# Patient Record
Sex: Female | Born: 1955
Health system: Western US, Academic
[De-identification: ages and names within clinical notes are randomized; demographics above are authoritative.]

## PROBLEM LIST (undated history)

## (undated) DIAGNOSIS — E559 Vitamin D deficiency, unspecified: Secondary | ICD-10-CM

## (undated) DIAGNOSIS — D219 Benign neoplasm of connective and other soft tissue, unspecified: Secondary | ICD-10-CM

## (undated) DIAGNOSIS — N951 Menopausal and female climacteric states: Secondary | ICD-10-CM

## (undated) DIAGNOSIS — Z8742 Personal history of other diseases of the female genital tract: Secondary | ICD-10-CM

## (undated) DIAGNOSIS — B9689 Other specified bacterial agents as the cause of diseases classified elsewhere: Secondary | ICD-10-CM

## (undated) DIAGNOSIS — R5383 Other fatigue: Secondary | ICD-10-CM

## (undated) DIAGNOSIS — N950 Postmenopausal bleeding: Secondary | ICD-10-CM

## (undated) DIAGNOSIS — Z87898 Personal history of other specified conditions: Secondary | ICD-10-CM

## (undated) DIAGNOSIS — G47429 Narcolepsy in conditions classified elsewhere without cataplexy: Secondary | ICD-10-CM

## (undated) DIAGNOSIS — F329 Major depressive disorder, single episode, unspecified: Secondary | ICD-10-CM

## (undated) DIAGNOSIS — R300 Dysuria: Secondary | ICD-10-CM

## (undated) DIAGNOSIS — H9319 Tinnitus, unspecified ear: Secondary | ICD-10-CM

## (undated) DIAGNOSIS — Z87440 Personal history of urinary (tract) infections: Secondary | ICD-10-CM

## (undated) DIAGNOSIS — D509 Iron deficiency anemia, unspecified: Secondary | ICD-10-CM

## (undated) DIAGNOSIS — R896 Abnormal cytological findings in specimens from other organs, systems and tissues: Secondary | ICD-10-CM

## (undated) DIAGNOSIS — A6000 Herpesviral infection of urogenital system, unspecified: Secondary | ICD-10-CM

## (undated) DIAGNOSIS — K625 Hemorrhage of anus and rectum: Secondary | ICD-10-CM

## (undated) DIAGNOSIS — N926 Irregular menstruation, unspecified: Secondary | ICD-10-CM

## (undated) DIAGNOSIS — Z6837 Body mass index (BMI) 37.0-37.9, adult: Secondary | ICD-10-CM

## (undated) DIAGNOSIS — F32A Depression, unspecified: Secondary | ICD-10-CM

## (undated) DIAGNOSIS — H409 Unspecified glaucoma: Secondary | ICD-10-CM

## (undated) DIAGNOSIS — Z8639 Personal history of other endocrine, nutritional and metabolic disease: Secondary | ICD-10-CM

## (undated) DIAGNOSIS — N840 Polyp of corpus uteri: Secondary | ICD-10-CM

## (undated) DIAGNOSIS — N760 Acute vaginitis: Secondary | ICD-10-CM

## (undated) DIAGNOSIS — J309 Allergic rhinitis, unspecified: Secondary | ICD-10-CM

## (undated) HISTORY — DX: Herpesviral infection of urogenital system, unspecified: A60.00

## (undated) HISTORY — DX: Major depressive disorder, single episode, unspecified: F32.9

## (undated) HISTORY — DX: Polyp of corpus uteri: N84.0

## (undated) HISTORY — DX: Tinnitus, unspecified ear: H93.19

## (undated) HISTORY — DX: Other fatigue: R53.83

## (undated) HISTORY — DX: Body mass index (bmi) 37.0-37.9, adult: Z68.37

## (undated) HISTORY — DX: Benign neoplasm of connective and other soft tissue, unspecified: D21.9

## (undated) HISTORY — DX: Vitamin D deficiency, unspecified: E55.9

## (undated) HISTORY — DX: Hemorrhage of anus and rectum: K62.5

## (undated) HISTORY — DX: Acute vaginitis: N76.0

## (undated) HISTORY — DX: Iron deficiency anemia, unspecified: D50.9

## (undated) HISTORY — DX: Allergic rhinitis, unspecified: J30.9

## (undated) HISTORY — DX: Postmenopausal bleeding: N95.0

## (undated) HISTORY — DX: Personal history of other diseases of the female genital tract: Z87.42

## (undated) HISTORY — DX: Irregular menstruation, unspecified: N92.6

## (undated) HISTORY — DX: Personal history of other endocrine, nutritional and metabolic disease: Z86.39

## (undated) HISTORY — DX: Abnormal cytological findings in specimens from other organs, systems and tissues: R89.6

## (undated) HISTORY — DX: Depression, unspecified: F32.A

## (undated) HISTORY — DX: Menopausal and female climacteric states: N95.1

## (undated) HISTORY — DX: Other specified bacterial agents as the cause of diseases classified elsewhere: B96.89

## (undated) HISTORY — DX: Personal history of urinary (tract) infections: Z87.440

## (undated) HISTORY — DX: Dysuria: R30.0

## (undated) HISTORY — DX: Personal history of other specified conditions: Z87.898

## (undated) HISTORY — DX: Unspecified glaucoma: H40.9

## (undated) HISTORY — DX: Narcolepsy in conditions classified elsewhere without cataplexy: G47.429

---

## 1998-11-09 ENCOUNTER — Encounter: Payer: Self-pay | Admitting: Obstetrics and Gynecology

## 1998-11-09 ENCOUNTER — Ambulatory Visit (HOSPITAL_COMMUNITY): Admission: RE | Admit: 1998-11-09 | Discharge: 1998-11-09 | Payer: Self-pay | Admitting: Obstetrics and Gynecology

## 1998-11-23 ENCOUNTER — Encounter: Payer: Self-pay | Admitting: Family Medicine

## 1998-11-23 ENCOUNTER — Encounter: Admission: RE | Admit: 1998-11-23 | Discharge: 1998-11-23 | Payer: Self-pay | Admitting: Family Medicine

## 1999-10-17 DIAGNOSIS — IMO0001 Reserved for inherently not codable concepts without codable children: Secondary | ICD-10-CM

## 1999-10-17 HISTORY — DX: Reserved for inherently not codable concepts without codable children: IMO0001

## 1999-10-26 ENCOUNTER — Other Ambulatory Visit: Admission: RE | Admit: 1999-10-26 | Discharge: 1999-10-26 | Payer: Self-pay | Admitting: Obstetrics and Gynecology

## 2000-01-17 DIAGNOSIS — Z8742 Personal history of other diseases of the female genital tract: Secondary | ICD-10-CM

## 2000-01-17 HISTORY — DX: Personal history of other diseases of the female genital tract: Z87.42

## 2000-02-10 ENCOUNTER — Other Ambulatory Visit: Admission: RE | Admit: 2000-02-10 | Discharge: 2000-02-10 | Payer: Self-pay | Admitting: Obstetrics and Gynecology

## 2000-02-13 ENCOUNTER — Other Ambulatory Visit: Admission: RE | Admit: 2000-02-13 | Discharge: 2000-02-13 | Payer: Self-pay | Admitting: Obstetrics and Gynecology

## 2000-02-13 ENCOUNTER — Encounter (INDEPENDENT_AMBULATORY_CARE_PROVIDER_SITE_OTHER): Payer: Self-pay

## 2000-03-02 ENCOUNTER — Ambulatory Visit (HOSPITAL_COMMUNITY): Admission: RE | Admit: 2000-03-02 | Discharge: 2000-03-02 | Payer: Self-pay | Admitting: Obstetrics and Gynecology

## 2000-03-02 ENCOUNTER — Encounter: Payer: Self-pay | Admitting: Obstetrics and Gynecology

## 2000-07-27 ENCOUNTER — Ambulatory Visit (HOSPITAL_COMMUNITY): Admission: RE | Admit: 2000-07-27 | Discharge: 2000-07-27 | Payer: Self-pay | Admitting: Internal Medicine

## 2001-09-16 DIAGNOSIS — A6000 Herpesviral infection of urogenital system, unspecified: Secondary | ICD-10-CM

## 2001-09-16 DIAGNOSIS — N951 Menopausal and female climacteric states: Secondary | ICD-10-CM

## 2001-09-16 HISTORY — DX: Menopausal and female climacteric states: N95.1

## 2001-09-16 HISTORY — DX: Herpesviral infection of urogenital system, unspecified: A60.00

## 2001-10-09 ENCOUNTER — Other Ambulatory Visit: Admission: RE | Admit: 2001-10-09 | Discharge: 2001-10-09 | Payer: Self-pay | Admitting: Obstetrics and Gynecology

## 2001-10-09 DIAGNOSIS — Z8742 Personal history of other diseases of the female genital tract: Secondary | ICD-10-CM

## 2001-10-09 DIAGNOSIS — D509 Iron deficiency anemia, unspecified: Secondary | ICD-10-CM

## 2001-10-09 HISTORY — DX: Iron deficiency anemia, unspecified: D50.9

## 2001-10-09 HISTORY — DX: Personal history of other diseases of the female genital tract: Z87.42

## 2001-10-17 ENCOUNTER — Ambulatory Visit (HOSPITAL_COMMUNITY): Admission: RE | Admit: 2001-10-17 | Discharge: 2001-10-17 | Payer: Self-pay | Admitting: Obstetrics and Gynecology

## 2001-10-17 ENCOUNTER — Encounter: Payer: Self-pay | Admitting: Obstetrics and Gynecology

## 2002-01-16 DIAGNOSIS — D219 Benign neoplasm of connective and other soft tissue, unspecified: Secondary | ICD-10-CM

## 2002-01-16 DIAGNOSIS — R300 Dysuria: Secondary | ICD-10-CM

## 2002-01-16 HISTORY — DX: Dysuria: R30.0

## 2002-01-16 HISTORY — DX: Benign neoplasm of connective and other soft tissue, unspecified: D21.9

## 2002-06-17 DIAGNOSIS — Z8742 Personal history of other diseases of the female genital tract: Secondary | ICD-10-CM

## 2002-06-17 HISTORY — DX: Personal history of other diseases of the female genital tract: Z87.42

## 2002-11-18 ENCOUNTER — Other Ambulatory Visit: Admission: RE | Admit: 2002-11-18 | Discharge: 2002-11-18 | Payer: Self-pay | Admitting: Obstetrics and Gynecology

## 2003-01-02 ENCOUNTER — Ambulatory Visit (HOSPITAL_COMMUNITY): Admission: RE | Admit: 2003-01-02 | Discharge: 2003-01-02 | Payer: Self-pay | Admitting: Obstetrics and Gynecology

## 2004-01-17 HISTORY — PX: EYE SURGERY: SHX253

## 2004-08-14 ENCOUNTER — Emergency Department (HOSPITAL_COMMUNITY): Admission: EM | Admit: 2004-08-14 | Discharge: 2004-08-15 | Payer: Self-pay | Admitting: Emergency Medicine

## 2004-08-15 ENCOUNTER — Ambulatory Visit (HOSPITAL_COMMUNITY): Admission: RE | Admit: 2004-08-15 | Discharge: 2004-08-16 | Payer: Self-pay | Admitting: Ophthalmology

## 2004-09-21 ENCOUNTER — Ambulatory Visit (HOSPITAL_COMMUNITY): Admission: RE | Admit: 2004-09-21 | Discharge: 2004-09-21 | Payer: Self-pay | Admitting: Ophthalmology

## 2005-01-23 ENCOUNTER — Other Ambulatory Visit: Admission: RE | Admit: 2005-01-23 | Discharge: 2005-01-23 | Payer: Self-pay | Admitting: Obstetrics and Gynecology

## 2005-04-10 ENCOUNTER — Ambulatory Visit (HOSPITAL_COMMUNITY): Admission: RE | Admit: 2005-04-10 | Discharge: 2005-04-10 | Payer: Self-pay | Admitting: Obstetrics and Gynecology

## 2007-01-15 ENCOUNTER — Ambulatory Visit (HOSPITAL_COMMUNITY): Admission: RE | Admit: 2007-01-15 | Discharge: 2007-01-15 | Payer: Self-pay | Admitting: Obstetrics and Gynecology

## 2007-03-17 DIAGNOSIS — Z87898 Personal history of other specified conditions: Secondary | ICD-10-CM

## 2007-03-17 HISTORY — DX: Personal history of other specified conditions: Z87.898

## 2007-04-17 DIAGNOSIS — K625 Hemorrhage of anus and rectum: Secondary | ICD-10-CM

## 2007-04-17 HISTORY — DX: Hemorrhage of anus and rectum: K62.5

## 2007-04-26 ENCOUNTER — Ambulatory Visit (HOSPITAL_BASED_OUTPATIENT_CLINIC_OR_DEPARTMENT_OTHER): Admission: RE | Admit: 2007-04-26 | Discharge: 2007-04-26 | Payer: Self-pay | Admitting: Internal Medicine

## 2007-05-05 ENCOUNTER — Ambulatory Visit: Payer: Self-pay | Admitting: Internal Medicine

## 2007-05-17 DIAGNOSIS — N950 Postmenopausal bleeding: Secondary | ICD-10-CM

## 2007-05-17 HISTORY — DX: Postmenopausal bleeding: N95.0

## 2007-06-17 DIAGNOSIS — B9689 Other specified bacterial agents as the cause of diseases classified elsewhere: Secondary | ICD-10-CM

## 2007-06-17 HISTORY — DX: Other specified bacterial agents as the cause of diseases classified elsewhere: B96.89

## 2007-08-19 ENCOUNTER — Ambulatory Visit (HOSPITAL_BASED_OUTPATIENT_CLINIC_OR_DEPARTMENT_OTHER): Admission: RE | Admit: 2007-08-19 | Discharge: 2007-08-19 | Payer: Self-pay | Admitting: Internal Medicine

## 2007-08-24 ENCOUNTER — Ambulatory Visit: Payer: Self-pay | Admitting: Internal Medicine

## 2007-11-17 DIAGNOSIS — Z87440 Personal history of urinary (tract) infections: Secondary | ICD-10-CM

## 2007-11-17 HISTORY — DX: Personal history of urinary (tract) infections: Z87.440

## 2008-06-29 ENCOUNTER — Encounter: Payer: Self-pay | Admitting: Family Medicine

## 2008-09-22 ENCOUNTER — Ambulatory Visit (HOSPITAL_COMMUNITY): Admission: RE | Admit: 2008-09-22 | Discharge: 2008-09-22 | Payer: Self-pay | Admitting: Obstetrics and Gynecology

## 2008-12-22 ENCOUNTER — Ambulatory Visit: Payer: Self-pay | Admitting: Family Medicine

## 2008-12-22 DIAGNOSIS — H9319 Tinnitus, unspecified ear: Secondary | ICD-10-CM

## 2008-12-22 DIAGNOSIS — G47429 Narcolepsy in conditions classified elsewhere without cataplexy: Secondary | ICD-10-CM

## 2008-12-22 DIAGNOSIS — D509 Iron deficiency anemia, unspecified: Secondary | ICD-10-CM | POA: Insufficient documentation

## 2008-12-22 DIAGNOSIS — J209 Acute bronchitis, unspecified: Secondary | ICD-10-CM

## 2008-12-22 HISTORY — DX: Tinnitus, unspecified ear: H93.19

## 2008-12-22 HISTORY — DX: Narcolepsy in conditions classified elsewhere without cataplexy: G47.429

## 2009-01-01 ENCOUNTER — Ambulatory Visit: Payer: Self-pay | Admitting: Family Medicine

## 2009-01-01 DIAGNOSIS — M7040 Prepatellar bursitis, unspecified knee: Secondary | ICD-10-CM | POA: Insufficient documentation

## 2009-01-05 ENCOUNTER — Telehealth: Payer: Self-pay | Admitting: Family Medicine

## 2009-04-16 ENCOUNTER — Ambulatory Visit: Payer: Self-pay | Admitting: Family Medicine

## 2009-04-16 LAB — CONVERTED CEMR LAB
Bilirubin Urine: NEGATIVE
Blood in Urine, dipstick: NEGATIVE
Nitrite: NEGATIVE
Specific Gravity, Urine: 1.02
WBC Urine, dipstick: NEGATIVE
pH: 7

## 2009-04-23 ENCOUNTER — Ambulatory Visit: Payer: Self-pay | Admitting: Family Medicine

## 2009-04-23 DIAGNOSIS — J309 Allergic rhinitis, unspecified: Secondary | ICD-10-CM | POA: Insufficient documentation

## 2009-04-23 HISTORY — DX: Allergic rhinitis, unspecified: J30.9

## 2009-05-04 LAB — CONVERTED CEMR LAB
Bilirubin, Direct: 0.1 mg/dL (ref 0.0–0.3)
Calcium: 8.9 mg/dL (ref 8.4–10.5)
Cholesterol: 145 mg/dL (ref 0–200)
Creatinine, Ser: 0.7 mg/dL (ref 0.4–1.2)
Eosinophils Absolute: 0.1 10*3/uL (ref 0.0–0.7)
Glucose, Bld: 112 mg/dL — ABNORMAL HIGH (ref 70–99)
HCT: 39.3 % (ref 36.0–46.0)
LDL Cholesterol: 80 mg/dL (ref 0–99)
Lymphocytes Relative: 47.7 % — ABNORMAL HIGH (ref 12.0–46.0)
Lymphs Abs: 2.4 10*3/uL (ref 0.7–4.0)
MCV: 87.9 fL (ref 78.0–100.0)
Platelets: 214 10*3/uL (ref 150.0–400.0)
Potassium: 3.6 meq/L (ref 3.5–5.1)
RDW: 13.9 % (ref 11.5–14.6)
TSH: 2.68 microintl units/mL (ref 0.35–5.50)
Triglycerides: 51 mg/dL (ref 0.0–149.0)
WBC: 5 10*3/uL (ref 4.5–10.5)

## 2009-07-26 ENCOUNTER — Telehealth: Payer: Self-pay | Admitting: Family Medicine

## 2009-08-06 ENCOUNTER — Ambulatory Visit: Payer: Self-pay | Admitting: Family Medicine

## 2009-08-06 ENCOUNTER — Telehealth: Payer: Self-pay | Admitting: *Deleted

## 2009-12-16 ENCOUNTER — Ambulatory Visit (HOSPITAL_COMMUNITY)
Admission: RE | Admit: 2009-12-16 | Discharge: 2009-12-16 | Payer: Self-pay | Source: Home / Self Care | Admitting: Family Medicine

## 2009-12-20 DIAGNOSIS — Z8639 Personal history of other endocrine, nutritional and metabolic disease: Secondary | ICD-10-CM

## 2009-12-20 HISTORY — DX: Personal history of other endocrine, nutritional and metabolic disease: Z86.39

## 2009-12-23 DIAGNOSIS — N840 Polyp of corpus uteri: Secondary | ICD-10-CM

## 2009-12-23 HISTORY — DX: Polyp of corpus uteri: N84.0

## 2009-12-27 ENCOUNTER — Ambulatory Visit: Payer: Self-pay | Admitting: Family Medicine

## 2009-12-27 ENCOUNTER — Encounter: Payer: Self-pay | Admitting: Family Medicine

## 2009-12-27 DIAGNOSIS — E559 Vitamin D deficiency, unspecified: Secondary | ICD-10-CM

## 2009-12-27 HISTORY — DX: Vitamin D deficiency, unspecified: E55.9

## 2009-12-30 LAB — CONVERTED CEMR LAB: Vit D, 25-Hydroxy: 30 ng/mL (ref 30–89)

## 2010-02-05 ENCOUNTER — Encounter: Payer: Self-pay | Admitting: Obstetrics and Gynecology

## 2010-02-06 ENCOUNTER — Encounter: Payer: Self-pay | Admitting: Obstetrics and Gynecology

## 2010-02-15 ENCOUNTER — Ambulatory Visit (HOSPITAL_COMMUNITY)
Admission: RE | Admit: 2010-02-15 | Discharge: 2010-02-15 | Payer: Self-pay | Source: Home / Self Care | Attending: Obstetrics and Gynecology | Admitting: Obstetrics and Gynecology

## 2010-02-15 DIAGNOSIS — Z6837 Body mass index (BMI) 37.0-37.9, adult: Secondary | ICD-10-CM

## 2010-02-15 DIAGNOSIS — N926 Irregular menstruation, unspecified: Secondary | ICD-10-CM

## 2010-02-15 HISTORY — PX: DILATION AND CURETTAGE OF UTERUS: SHX78

## 2010-02-15 HISTORY — DX: Body mass index (BMI) 37.0-37.9, adult: Z68.37

## 2010-02-15 HISTORY — DX: Irregular menstruation, unspecified: N92.6

## 2010-02-15 HISTORY — PX: HYSTEROSCOPY: SHX211

## 2010-02-15 LAB — CBC
HCT: 38.5 % (ref 36.0–46.0)
MCHC: 33.5 g/dL (ref 30.0–36.0)
MCV: 85 fL (ref 78.0–100.0)
RBC: 4.53 MIL/uL (ref 3.87–5.11)
RDW: 13.9 % (ref 11.5–15.5)
WBC: 5.4 10*3/uL (ref 4.0–10.5)

## 2010-02-15 NOTE — H&P (Signed)
  [name], [name]             ACCOUNT NO.:  [id number]  MEDICAL RECORD NO.:  [id number]         PATIENT TYPE:  WAMB  LOCATION:                                FACILITY:  [facility]  PHYSICIAN:  Janine Limbo, M.D.DATE OF BIRTH:  1955-09-13  DATE OF ADMISSION:  02/15/2010 DATE OF DISCHARGE:                             HISTORY & PHYSICAL   HISTORY OF PRESENT ILLNESS:  [name] is a 55 year old female, para 4-0-0-4, who presents for hysteroscopy with resection of polyps and a submucosal fibroid.  The patient has been followed at the [facility].  The patient complains of irregular menstrual cycles.  She has a history of anemia. She has a history of fibroids.  An ultrasound showed a 12.55 x 10.6 cm fibroid uterus with multiple fibroids.  Some of fibroids were thought to be submucosal.  The left ovary appeared normal.  The right ovary could not be seen.  A polyp was noted. An endometrial biopsy was obtained that  showed benign elements.  Her most recent Pap smear was within normal limits.  OBSTETRICAL HISTORY:  The patient has had 4 term vaginal deliveries.  DRUG ALLERGIES: 1. The patient reports that she is allergic to CODEINE. 2. She reports that she is also allergic to LATEX.  PAST MEDICAL HISTORY:  The patient denies; 1. Hypertension. 2. Diabetes.  MEDICATIONS:  She currently takes; 1. Vitamin D. 2. Calcium. 3. Fish oil. 4. She takes Valtrex 500 mg each day.  SOCIAL HISTORY:  The patient used to be a cigarette smoker, but is no longer smoking.  She denies alcohol use and other recreational drug uses.  REVIEW OF SYSTEMS:  Please see history of present illness.  FAMILY HISTORY:  Noncontributory.  PHYSICAL EXAMINATION:  VITAL SIGNS:  Weight is 233 pounds.  Height is 5 feet 7 inches (BMI equals 37). HEENT:  Within normal limits. Chest:  Clear. HEART:  Regular rate and rhythm. BREASTS:  Without  masses. ABDOMEN:  Nontender. EXTREMITIES:  Grossly normal. NEUROLOGIC:  Grossly normal. PELVIC:  External genitalia is normal.  The vagina is normal.  Cervix is nontender.  The uterus is 16-18 weeks size and irregular.  ADNEXA:  No masses are appreciated and rectovaginal exam confirms.  ASSESSMENT: 1. Irregular menstrual cycles. 2. Fibroid uterus. 3. History of anemia. 4. BMI equals 37.  PLAN:  The patient will undergo hysteroscopy with resection of submucosal fibroids and endometrial polyps.  At present, the patient understands the indications for her surgical procedure and she accepts the risks of, but not limited to, anesthetic complications, bleeding, infections, and possible damage to the surrounding organs.     Janine Limbo, M.D.     AVS/MEDQ  D:  02/14/2010  T:  02/14/2010  Job:  [id number]  cc:   Evelena Peat, M.D.  Merlene Laughter. Renae Gloss, M.D. Fax: [phone]  Electronically Signed by Kirkland Hun M.D. on 02/15/2010 12:12:45 PM

## 2010-02-17 NOTE — Progress Notes (Signed)
Summary: lab order  Phone Note Call from Patient Call back at Home Phone [phone]   Caller: Patient Call For: Evelena Peat MD Summary of Call: pt would like to have lab order for vit d ,glucose and blood type can I sch? Initial call taken by: Heron Sabins,  July 26, 2009 11:53 AM  Follow-up for Phone Call        OK to set up but I'm not sure blood typing will be covered by insurance without valid dx. Follow-up by: Evelena Peat MD,  August 01, 2009 3:25 PM  Additional Follow-up for Phone Call Additional follow up Details #1::        lab ov 08-06-2009 8.30a Additional Follow-up by: Heron Sabins,  August 03, 2009 8:18 AM

## 2010-02-17 NOTE — Progress Notes (Signed)
Summary: LMTCB, Jozy Mcphearson please call  Phone Note Call from Patient Call back at Home Phone [phone]   Caller: Patient Call For: Evelena Peat MD Summary of Call: pt would like Tassie Pollett to return her call. Initial call taken by: Heron Sabins,  August 06, 2009 4:36 PM  Follow-up for Phone Call        [facility] Sid Falcon LPN  August 06, 2009 5:14 PM   Additional Follow-up for Phone Call Additional follow up Details #1::        spoke with pt - question about vit d level - will need rx.and rov in 3 mos.  - [facility] KIK Additional Follow-up by: Duard Brady LPN,  August 09, 2009 5:04 PM

## 2010-02-17 NOTE — Assessment & Plan Note (Signed)
Summary: CPX // RS   Vital Signs:  Patient profile:   55 year old female Menstrual status:  irregular Height:      67 inches Weight:      235 pounds Temp:     97.8 degrees F oral BP sitting:   120 / 80  (left arm) Cuff size:   large  Vitals Entered By: Sid Falcon LPN (April 23, 1608 11:32 AM) CC: CPX   History of Present Illness: here for CPE.    Sees GYN regularly.  Tetanus up-to-date. Colonoscopy up-to-date and normal. Patient gets regular yearly Pap smears and mammograms.  Acute issue of feeling lightheaded past few weeks. She thinks this may be related to nasal congestion. Occasional postnasal drip symptoms. Recently started back Claritin.  Patient has had some weight gain and difficulty losing weight. Exercising about 3 days per week and plans to increase. Has made some dietary changes.  Family history, past medical history, social history reviewed and no significant changes. Question of history of narcolepsy but no recent concerns. She was treated with modafinil at one point but had side effects       Allergies: 1)  Codeine Sulfate (Codeine Sulfate)  Past History:  Past Medical History: Last updated: 12/22/2008 Anemia-iron deficiency Depression Narcolepsy uterine fibroids  Past Surgical History: Last updated: 12/22/2008 Surgery to eyes X 2 in 2006  Family History: Last updated: 12/22/2008 Family history heart disease, father  CHF Family History Diabetes, mother Family History Hypertension, mother and father Alzheimers, mother Stomach cancer, kidnesy cancer, uncle  Social History: Last updated: 04/23/2009 Occupation: Married Alcohol use-yes Regular exercise-yes Quit smoking 2001 4 children  Risk Factors: Exercise: yes (12/22/2008)  Risk Factors: Smoking Status: quit (12/22/2008) PMH-FH-SH reviewed for relevance  Social History: Occupation: Married Alcohol use-yes Regular exercise-yes Quit smoking 2001 4 children  Review of  Systems       The patient complains of weight gain.  The patient denies anorexia, fever, weight loss, vision loss, decreased hearing, hoarseness, chest pain, syncope, dyspnea on exertion, peripheral edema, prolonged cough, headaches, hemoptysis, abdominal pain, melena, hematochezia, severe indigestion/heartburn, hematuria, incontinence, muscle weakness, suspicious skin lesions, and depression.    Physical Exam  General:  Well-developed,well-nourished,in no acute distress; alert,appropriate and cooperative throughout examination Head:  Normocephalic and atraumatic without obvious abnormalities. No apparent alopecia or balding. Eyes:  pupils equal, pupils round, and pupils reactive to light.   Ears:  External ear exam shows no significant lesions or deformities.  Otoscopic examination reveals clear canals, tympanic membranes are intact bilaterally without bulging, retraction, inflammation or discharge. Hearing is grossly normal bilaterally. Mouth:  Oral mucosa and oropharynx without lesions or exudates.  Teeth in good repair. Neck:  No deformities, masses, or tenderness noted. Breasts:  per GYN Lungs:  Normal respiratory effort, chest expands symmetrically. Lungs are clear to auscultation, no crackles or wheezes. Heart:  Normal rate and regular rhythm. S1 and S2 normal without gallop, murmur, click, rub or other extra sounds. Abdomen:  Bowel sounds positive,abdomen soft and non-tender without masses, organomegaly or hernias noted. 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 Neurologic:  No cranial nerve deficits noted. Station and gait are normal. Plantar reflexes are down-going bilaterally. DTRs are symmetrical throughout. Sensory, motor and coordinative functions appear intact. Skin:  Intact without suspicious lesions or rashes Cervical Nodes:  No lymphadenopathy noted Psych:  normally  interactive, good eye contact, not anxious appearing,  and not depressed appearing.     Impression & Recommendations:  Problem # 1:  Preventive Health Care (ICD-V70.0) labs reviewed and favorable with exception of glucose 112. Discussed weight loss strategies. Continue regular GYN followup. Colonoscopy up to date. Tetanus up-to-date.  Problem # 2:  ALLERGIC RHINITIS (ICD-477.9)  Her updated medication list for this problem includes:    Astelin 137 Mcg/spray Soln (Azelastine hcl) .Marland Kitchen... 1-2 sprays per nostril two times a day.  Complete Medication List: 1)  Calcium 600 1500 Mg Tabs (Calcium carbonate) .... Every other day 2)  Naprosyn 500 Mg Tabs (Naproxen) .... One by mouth two times a day with food 3)  Astelin 137 Mcg/spray Soln (Azelastine hcl) .Marland Kitchen.. 1-2 sprays per nostril two times a day.  Patient Instructions: 1)  Try over the counter Allegra or Zyrtec for allergy symptoms. Prescriptions: ASTELIN 137 MCG/SPRAY SOLN (AZELASTINE HCL) 1-2 sprays per nostril two times a day.  #1 x 6   Entered and Authorized by:   Evelena Peat MD   Signed by:   Evelena Peat MD on 04/23/2009   Method used:   [facility]        [id number]       [address]       [place], [place]  [address]       Ph: [phone] or [phone]       Fax: [phone]   RxID:   [id number]   Preventive Care Screening  Colonoscopy:    Date:  01/17/2007    Results:  normal     Preventive Care Screening  Colonoscopy:    Date:  01/17/2007    Results:  normal

## 2010-02-17 NOTE — Assessment & Plan Note (Signed)
Summary: 3 MTH ROV // RS   Vital Signs:  Patient profile:   55 year old female Menstrual status:  irregular Weight:      226 pounds Temp:     98.3 degrees F oral BP sitting:   130 / 82  (left arm) Cuff size:   large  Vitals Entered By: Sid Falcon LPN (December 27, 2009 11:05 AM)  History of Present Illness: Patient here to discuss several items as follows.  Some chronic intermittent low back pain. Fell on Thanksgiving and may have exacerbated. Requesting name of chiropractors. Describes achy pain 3 out 10 severity lower lumbar region. No radiculopathy symptoms. No numbness or weakness. No urine incontinence. No relief with medication. Exacerbated by movement. Exacerbated by prolonged standing.  Questionable history narcolepsy. States she's had previous sleep study with no obstructive apnea. Was on modafinil but was too costly. Treated with methylphenidate which seemed to work well for her. She does not recall dose she was on.  She states she "self medicates" with caffeine.  She has not felt at risk with things like driving .  Has difficulty with things like reading or staying awake for movies.  History low vitamin D. Currently takes 400 international unit supplement daily. No hx of osteoprosis.  She plans to get PE in April and repeat then.  She has lost some weight due to her efforts.  Allergies: 1)  Codeine Sulfate (Codeine Sulfate)  Past History:  Past Medical History: Last updated: 12/22/2008 Anemia-iron deficiency Depression Narcolepsy uterine fibroids  Past Surgical History: Last updated: 12/22/2008 Surgery to eyes X 2 in 2006  Family History: Last updated: 12/22/2008 Family history heart disease, father  CHF Family History Diabetes, mother Family History Hypertension, mother and father Alzheimers, mother Stomach cancer, kidnesy cancer, uncle  Social History: Last updated: 04/23/2009 Occupation: Married Alcohol use-yes Regular exercise-yes Quit  smoking 2001 4 children  Risk Factors: Exercise: yes (12/22/2008)  Risk Factors: Smoking Status: quit (12/22/2008) PMH-FH-SH reviewed for relevance  Review of Systems  The patient denies anorexia, fever, weight gain, chest pain, syncope, dyspnea on exertion, peripheral edema, prolonged cough, headaches, hemoptysis, abdominal pain, melena, hematochezia, severe indigestion/heartburn, muscle weakness, and depression.    Physical Exam  General:  Well-developed,well-nourished,in no acute distress; alert,appropriate and cooperative throughout examination Ears:  External ear exam shows no significant lesions or deformities.  Otoscopic examination reveals clear canals, tympanic membranes are intact bilaterally without bulging, retraction, inflammation or discharge. Hearing is grossly normal bilaterally. Mouth:  Oral mucosa and oropharynx without lesions or exudates.  Teeth in good repair. Neck:  No deformities, masses, or tenderness noted. Lungs:  Normal respiratory effort, chest expands symmetrically. Lungs are clear to auscultation, no crackles or wheezes. Heart:  Normal rate and regular rhythm. S1 and S2 normal without gallop, murmur, click, rub or other extra sounds. Msk:  No deformity or scoliosis noted of thoracic or lumbar spine.   Extremities:  No clubbing, cyanosis, edema, or deformity noted with normal full range of motion of all joints.   Neurologic:  alert & oriented X3, cranial nerves II-XII intact, strength normal in all extremities, and DTRs symmetrical and normal.   Skin:  no rashes and no suspicious lesions.   Cervical Nodes:  No lymphadenopathy noted   Impression & Recommendations:  Problem # 1:  VITAMIN D DEFICIENCY (ICD-268.9) pt requests repeat level today. Orders: T-Vitamin D (25-Hydroxy) [id number]) Specimen Handling (09811) Venipuncture (91478)  Problem # 2:  LUMBAGO (ICD-724.2) pt given name of local chiropracter. Her updated  medication list for this problem  includes:    Naprosyn 500 Mg Tabs (Naproxen) ..... 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  Complete Medication List: 1)  Calcium 600 1500 Mg Tabs (Calcium carbonate) .... Every other day 2)  Naprosyn 500 Mg Tabs (Naproxen) .... One by mouth two times a day with food as needed 3)  Astelin 137 Mcg/spray Soln (Azelastine hcl) .Marland Kitchen.. 1-2 sprays per nostril two times a day. 4)  Vitamin D (ergocalciferol) 50000 Unit Caps (Ergocalciferol) .Marland Kitchen.. 1 by mouth qweek  Other Orders: Admin 1st Vaccine (16109) Flu Vaccine 83yrs + [id number])  Patient Instructions: 1)  Schedule complete physical for April of 2012 include vitamin D level with standard labs 2)  Dr Genene Churn (chiropractor) 3)  It is important that you exercise reguarly at least 20 minutes 5 times a week. If you develop chest pain, have severe difficulty breathing, or feel very tired, stop exercising immediately and seek medical attention.  4)  You need to lose weight. Consider a lower calorie diet and regular exercise.    Orders Added: 1)  Admin 1st Vaccine [90471] 2)  Flu Vaccine 48yrs + [09811] 3)  T-Vitamin D (25-Hydroxy) [[id number]] 4)  Specimen Handling [99000] 5)  Venipuncture [13086] 6)  Est. Patient Level IV [57846]   Flu Vaccine Consent Questions     Do you have a history of severe allergic reactions to this vaccine? no    Any prior history of allergic reactions to egg and/or gelatin? no    Do you have a sensitivity to the preservative Thimersol? no    Do you have a past history of Guillan-Barre Syndrome? no    Do you currently have an acute febrile illness? no    Have you ever had a severe reaction to latex? no    Vaccine information given and explained to patient? yes    Are you currently pregnant? no    Lot Number:[id number]   Exp Date:07/16/2010   Site Given  Left Deltoid  IM         .lbflu

## 2010-02-22 NOTE — Op Note (Signed)
NAMEFRANCES, AMBROSINO             ACCOUNT NO.:  [id number]  MEDICAL RECORD NO.:  [id number]          PATIENT TYPE:  AMB  LOCATION:  SDC                           FACILITY:  [facility]  PHYSICIAN:  Janine Limbo, M.D.DATE OF BIRTH:  [birth year]  DATE OF PROCEDURE:  02/15/2010 DATE OF DISCHARGE:  02/15/2010                              OPERATIVE REPORT   PREOPERATIVE DIAGNOSES: 1. Irregular menstrual bleeding. 2. Fibroid uterus. 3. Questionable endometrial polyp. 4. History of anemia. 5. BMI equals 37.  POSTOPERATIVE DIAGNOSES: 1. Irregular bleeding. 2. Fibroid uterus. 3. History of anemia. 4. BMI equals 37.  PROCEDURE: 1. Hysteroscopy with resection of submucosal fibroid. 2. Dilatation and curettage.  SURGEON:  Janine Limbo, MD  FIRST ASSISTANT:  None.  ANESTHETIC:  General.  DISPOSITION:  Mary Romero is a 55 year old female who presents with the above-mentioned diagnosis.  A hydrosonogram was performed and it showeda multi fibroid uterus with a submucosal fibroid present.  There was a question of an endometrial polyp.  We discussed our management options which included endometrial biopsy, hysterectomy, observation, and the performed procedure.  The risk and benefits of each of those options were reviewed.  The patient elected to proceed with hysteroscopy with resection.  She understands the indications and she accepts the risks of, but not limited to, anesthetic complications, bleeding, infection, and possible damage to the surrounding organs.  The patient understands that the objective is to diagnose the nature of the tissue within the endometrium not necessarily to remove the fibroids.  FINDINGS:  The patient was noted to have a 16-week size irregular uterus.  No adnexal masses could be appreciated.  The uterus sounded to 14-16 cm.  The patient was noted to have a 4 cm submucosal fibroid.  No endometrial polyps were appreciated on hysteroscopy.  No other  uterine pathology was appreciated.  The patient's preoperative hemoglobin was 12.9.  PROCEDURE:  The patient was taken to the operating room where a general anesthetic was given.  The patient's lower abdomen, perineum, and vagina were prepped with multiple layers of Betadine.  The bladder was drained of urine.  The patient was sterilely draped.  Examination under anesthesia was performed.  A paracervical block was placed using 10 mL of 0.5% lidocaine with epinephrine.  An endocervical curettage was performed.  The uterus sounded to approximately 14 cm.  The cervix was gently dilated.  The operative hysteroscope was inserted and the cavity was carefully inspected.  Pictures were taken.  The patient was noted to have a submucosal fibroid.  We then used the single loop of the VersaPoint to resect portions of the submucosal fibroid so that we could have a pathology specimen.  The hysteroscope was then removed and the cavity was curetted.  We then reinserted the hysteroscope and the cavity was again inspected.  There was no evidence of other pathology other than the portions of the submucosal fibroid that remained.  Because the patient reports that she has had no uterine bleeding in almost 2 months, the decision was made that we would terminate the procedure at this point given our primary objective was to assess the pathologic  nature of the endometrial cavity not necessarily to remove all fibroids.  All instruments were then removed.  The patient's exam was repeated and her uterus again was noted to be firm, irregular, and approximately 14-week size.  Sponge and needle counts were correct.  The patient's anesthetic was reversed and the patient was transported to the recovery room in stable condition.  The estimated blood loss was approximately 50 mL. The estimated fluid deficit was 125 mL.  The patient tolerated her procedure well.  The endocervical curettings, endometrial resections, and  endometrial curettings were sent to Pathology for evaluation.  FOLLOWUP INSTRUCTIONS:  The patient will return to see Dr. Stefano Gaul in 2 weeks for followup examination.  She was given a copy of the postoperative instruction sheet that was prepared by the [facility] for patients who have undergone hysteroscopy. The patient was given prescriptions for: 1. Motrin 800 mg 1 tablet every 8 hours as needed for mild-to-moderate     pain. 2. Ultram 50-100 mg every 4-6 hours as needed for pain. 3. Phenergan 25 mg 1 tablet every 6 hours as needed for nausea.     Janine Limbo, M.D.     AVS/MEDQ  D:  02/15/2010  T:  02/16/2010  Job:  [id number]  cc:   Merlene Laughter. Renae Gloss, M.D. Fax: [phone]  Evelena Peat, M.D.  Electronically Signed by Kirkland Hun M.D. on 02/22/2010 10:04:45 PM

## 2010-04-18 ENCOUNTER — Other Ambulatory Visit (INDEPENDENT_AMBULATORY_CARE_PROVIDER_SITE_OTHER): Payer: BC Managed Care – PPO | Admitting: Family Medicine

## 2010-04-18 DIAGNOSIS — Z0000 Encounter for general adult medical examination without abnormal findings: Secondary | ICD-10-CM

## 2010-04-18 LAB — BASIC METABOLIC PANEL
CO2: 27 mEq/L (ref 19–32)
Chloride: 102 mEq/L (ref 96–112)
Creatinine, Ser: 0.7 mg/dL (ref 0.4–1.2)
Potassium: 4.2 mEq/L (ref 3.5–5.1)
Sodium: 137 mEq/L (ref 135–145)

## 2010-04-18 LAB — CBC WITH DIFFERENTIAL/PLATELET
Basophils Absolute: 0 10*3/uL (ref 0.0–0.1)
Eosinophils Absolute: 0.2 10*3/uL (ref 0.0–0.7)
Eosinophils Relative: 3.1 % (ref 0.0–5.0)
Lymphocytes Relative: 40.3 % (ref 12.0–46.0)
Lymphs Abs: 2.1 10*3/uL (ref 0.7–4.0)
Monocytes Relative: 11 % (ref 3.0–12.0)
RDW: 14 % (ref 11.5–14.6)

## 2010-04-18 LAB — HEPATIC FUNCTION PANEL
Albumin: 3.7 g/dL (ref 3.5–5.2)
Alkaline Phosphatase: 65 U/L (ref 39–117)
Bilirubin, Direct: 0.1 mg/dL (ref 0.0–0.3)
Total Bilirubin: 0.2 mg/dL — ABNORMAL LOW (ref 0.3–1.2)
Total Protein: 7.4 g/dL (ref 6.0–8.3)

## 2010-04-18 LAB — LIPID PANEL
LDL Cholesterol: 74 mg/dL (ref 0–99)
Total CHOL/HDL Ratio: 3
Triglycerides: 45 mg/dL (ref 0.0–149.0)

## 2010-04-18 LAB — TSH: TSH: 2.37 u[IU]/mL (ref 0.35–5.50)

## 2010-04-19 ENCOUNTER — Encounter: Payer: Self-pay | Admitting: Family Medicine

## 2010-04-19 NOTE — Progress Notes (Signed)
Quick Note:  Pt informed on home VM ______ 

## 2010-04-25 ENCOUNTER — Ambulatory Visit (INDEPENDENT_AMBULATORY_CARE_PROVIDER_SITE_OTHER): Payer: BC Managed Care – PPO | Admitting: Family Medicine

## 2010-04-25 ENCOUNTER — Encounter: Payer: Self-pay | Admitting: Family Medicine

## 2010-04-25 VITALS — BP 130/90 | HR 72 | Temp 98.5°F | Resp 12 | Ht 66.75 in | Wt 227.0 lb

## 2010-04-25 DIAGNOSIS — Z299 Encounter for prophylactic measures, unspecified: Secondary | ICD-10-CM

## 2010-04-25 MED ORDER — TETANUS-DIPHTH-ACELL PERTUSSIS 5-2.5-18.5 LF-MCG/0.5 IM SUSP: 0.5000 mL | Freq: Once | INTRAMUSCULAR | Status: DC

## 2010-04-25 NOTE — Patient Instructions (Signed)
Calorie Counting Diet A calorie counting diet requires you to eat the number of calories that are right for you during a day. Calories are the measurement of how much energy you get from the food you eat. Eating the right amount of calories is important for staying at a healthy weight. If you eat too many calories your body will store them as fat and you may gain weight. If you eat too few calories you may lose weight. Counting the number of calories that you eat during a day will help you to know if you're eating the right amount. A Registered Dietitian can determine how many calories you need in a day. The amount of calories you need varies from person to person. If your goal is to lose weight you will need to eat fewer calories. Losing weight can benefit you if you are overweight or have health problems such as heart disease, high blood pressure or diabetes. If your goal is to gain weight, you will need to eat more calories. Gaining weight may be necessary if you have a certain health problem that causes your body to need more energy. TIPS Whether you are increasing or decreasing the number of calories you eat during a day, it may be hard to get used to changing what you eat and drink. The following are tips to help you keep track of the number of calories you are eating.  Measuring foods at home with measuring cups will help you to know the actual amount of food and number of calories you are eating.   Restaurants serve food in all different portion sizes. It is common that restaurants will serve food in amounts worth 2 or more serving sizes. While eating out, it may be helpful to estimate how many servings of a food you are given. For example, a serving of cooked rice is 1/2 cup and that is the size of half of a fist. Knowing serving sizes will help you have a better idea of how much food you are eating at restaurants.   Ask for smaller portion sizes or child-size portions at restaurants.   Plan to  eat half of a meal at a restaurant and take the rest home or share the other half with a friend   Read food labels for calorie content and serving size   Most packaged food has a Nutrition Facts Panel on its side or back. Here you can find out how many servings are in a package, the size of a serving, and the number of calories each serving has.   The serving size and number of servings per container are listed right below the Nutrition Facts heading. Just below the serving information, the number of calories in each serving is listed.   For example, say that a package has three cookies inside. The Nutrition Facts panel says that one serving is one cookie. Below that, it says that there are three servings in the container. The calories section of the Nutrition Facts says there are 90 calories. That means that there are 90 calories in one cookie. If you eat one cookie you have eaten 90 calories. If you eat all three cookies, you have eaten three times that amount, or 270 calories.  The list below tells you how big or small some common portion sizes are.  1 ounce (oz).................4 stacked dice.   3 oz..............................Deck of cards.   1 teaspoon (tsp)...........Tip of little finger.   1 tablespoon (Tbsp)....Tip of thumb.     2 Tbsp..........................Golf ball.    Cup..........................Half of a fist.   1 Cup...........................A fist.  KEEP A FOOD LOG Write down every food item that you eat, how much of the food you eat, and the number of calories in each food that you eat during the day. At the end of the day or throughout the day you can add up the total number of calories you have eaten.  It may help to set up a list like the one below. Find out the calorie information by reading food labels.  Breakfast   Bran Flakes (1 cup, 110 calories).   Fat free milk ( cup, 45 calories).   Snack   Apple (1 medium, 80 calories).   Lunch   Spinach (1  cup, 20 calories).   Tomato ( medium, 20 calories).   Chicken breast strips (3 oz, 165 calories).   Shredded cheddar cheese ( cup, 110 calories).   Light Italian dressing (2 Tbsp, 60 calories).   Whole wheat bread (1 slice, 80 calories).   Tub margarine (1 tsp, 35 calories).   Vegetable soup (1 cup, 160 calories).   Dinner   Pork chop (3 oz, 190 calories).   Brown rice (1 cup, 215 calories).   Steamed broccoli ( cup, 20 calories).   Strawberries (1  cup, 65 calories).   Whipped cream (1 Tbsp, 50 calories).  Daily Calorie Total: 1425 Information from www.eatright.org, Foodwise Nutritional Analysis Database. Document Released: 01/02/2005 Document Re-Released: 01/24/2009 Exit[employer] Patient Information 2011 [employer]. 

## 2010-04-25 NOTE — Progress Notes (Signed)
  Subjective:    Patient ID: Mary Romero, female    DOB: 23-Feb-1955, 55 y.o.   MRN: [id number]  HPI Patient is here for complete physical examination. She does see gynecologist. Had hysteroscopy in January with fibroid tumor. She still is having menses and is contemplating possible hysterectomy. She is hoping menopause comes soon. Colonoscopy 2009. Last tetanus 2004-expecting grandchild soon and no hx of Tdap. Getting yearly mammograms. Pap smear November 2011.  History prediabetes. Family history reviewed and positive for type 2 diabetes and hypertension   Review of Systems  Constitutional: Positive for appetite change. Negative for fever, activity change and fatigue.  HENT: Negative for hearing loss, ear pain, sore throat and trouble swallowing.   Eyes: Negative for visual disturbance.  Respiratory: Negative for cough and shortness of breath.   Cardiovascular: Negative for chest pain and palpitations.  Gastrointestinal: Negative for abdominal pain, diarrhea, constipation and blood in stool.  Genitourinary: Negative for dysuria and hematuria.  Musculoskeletal: Negative for myalgias, back pain and arthralgias.  Skin: Negative for rash.  Neurological: Negative for dizziness, syncope and headaches.  Hematological: Negative for adenopathy.  Psychiatric/Behavioral: Negative for confusion and dysphoric mood.       Objective:   Physical Exam  Constitutional: She is oriented to person, place, and time. She appears well-developed and well-nourished.  HENT:  Head: Normocephalic and atraumatic.  Eyes: EOM are normal. Pupils are equal, round, and reactive to light.  Neck: Normal range of motion. Neck supple. No thyromegaly present.  Cardiovascular: Normal rate, regular rhythm and normal heart sounds.   No murmur heard. Pulmonary/Chest: Breath sounds normal. No respiratory distress. She has no wheezes. She has no rales.  Abdominal: Soft. Bowel sounds are normal. She exhibits no distension  and no mass. There is no tenderness. There is no rebound and no guarding.  Genitourinary:       Per GYN  Musculoskeletal: Normal range of motion. She exhibits no edema.  Lymphadenopathy:    She has no cervical adenopathy.  Neurological: She is alert and oriented to person, place, and time. She displays normal reflexes. No cranial nerve deficit.  Skin: No rash noted.  Psychiatric: She has a normal mood and affect. Her behavior is normal. Judgment and thought content normal.          Assessment & Plan:  Health maintenance exam. Tetanus booster given-Tdap. Colonoscopy up-to-date. Continue GYN followup. Labs reviewed with patient. She has borderline elevated blood pressure and prediabetes. Would recommend further weight loss and consistent exercise. Continued vitamin D supplementation.

## 2010-05-31 NOTE — Procedures (Signed)
Mary Romero, Mary Romero             ACCOUNT NO.:  [id number]   MEDICAL RECORD NO.:  [id number]          PATIENT TYPE:  OUT   LOCATION:  SLEEP CENTER                 FACILITY:  [facility]   PHYSICIAN:  Clinton D. Maple Hudson, MD, FCCP, FACPDATE OF BIRTH:  06/11/1955   DATE OF STUDY:  08/19/2007                          MULTIPLE SLEEP LATENCY TEST   REFERRING PHYSICIAN:  Merlene Laughter. Renae Gloss, M.D.   REFERRING PHYSICIAN:  Dr. Andi Devon.  This is a multiple sleep  latency test report.   INDICATION FOR STUDY:  History of narcolepsy with significant daytime  somnolence.  A diagnostic NPSG on April 26, 2007, had recorded an AHI of  1.3 per hour.  Multiple sleep latency test is requested to objectively  evaluate daytime sleepiness.   EPWORTH SLEEPINESS SCORE:  17/24.   BMI:  BMI 36, weight 230 pounds, height 67 inches, neck 16.5 inches.   MEDICATIONS:  No home medications were listed.   NAP 1:  8:05 a.m., sleep latency 0.5 minutes, REM latency NA.   NAP 2:  10:05 a.m., sleep latency 7.5 minutes, REM latency NA.   NAP 3:  12:05 p.m., sleep latency 0.5 minutes, REM latency NA.   NAP 4:  14:05 p.m., sleep latency 2.5 minutes, REM latency NA.   NAP 5:  16:05 p.m., sleep latency 7 minutes, REM latency NA.   IMPRESSIONS-RECOMMENDATIONS:  Mean sleep latency 3.6 minutes with no  sleep onset REM.  This confirms pathologic hypersomnia, but is  nonspecific.  In the absence of inadequate nocturnal sleep, it is  consistent with a diagnosis of narcolepsy or idiopathic hypersomnia.      Clinton D. Maple Hudson, MD, [facility], FACP  Diplomate, Biomedical engineer of Sleep Medicine  Electronically Signed    CDY/MEDQ  D:  08/24/2007 09:58:36  T:  08/24/2007 10:19:35  Job:  [id number]

## 2010-05-31 NOTE — Procedures (Signed)
[name], [name]             ACCOUNT NO.:  [id number]   MEDICAL RECORD NO.:  [id number]          PATIENT TYPE:  OUT   LOCATION:  SLEEP CENTER                 [facility]AC[facility]   PHYSICIAN:  Clinton D. Maple Hudson, MD, FCCP, FACPDATE OF BIRTH:  1955-08-12   DATE OF STUDY:  04/26/2007                            NOCTURNAL POLYSOMNOGRAM   REFERRING PHYSICIAN:  Merlene Laughter. Renae Gloss, M.D.   INDICATION FOR STUDY:  Diagnostic NPSG study requested for history of  hypersomnia with previous diagnosis of narcolepsy.   EPWORTH SLEEPINESS SCORE:  17/24.  BMI 44.7, weight 234 pounds, height  60.7 inches, neck 16.5 inches.   MEDICATIONS:  No home medication reported.   SLEEP ARCHITECTURE:  Total sleep time 409.5 minutes, sleep efficiency  93%.  Stage 1 was 4.5%, stage 2 was 76.9%, stage 3 was 0.2%, REM 18.3%  of total sleep time.  Sleep latency 2 minutes, REM latency 141.5  minutes, awake after sleep onset 28.5 minutes, arousal index 8.6.  No  bedtime medication was taken.   RESPIRATORY DATA:  Apnea/hypopnea index (AHI) 1.3 per hour which is  normal.  Nine events were scored, including 2 obstructive apneas and 7  hypopneas.  Events were more common while supine.  REM AHI 6.4.   OXYGEN DATA:  Moderate snoring with oxygen desaturation to a nadir of  87%.  Mean oxygen saturation through the study was 96.7% on room air.   CARDIAC DATA:  Normal sinus rhythm.   MOVEMENT-PARASOMNIA:  No significant movement disturbance.  No bathroom  trips.   IMPRESSIONS-RECOMMENDATIONS:  Unremarkable sleep architecture for an  adult in an unfamiliar sleep environment.  The percentage of time spent  in rapid eye movement is not remarkable, but it was noticed that she  slept longer and more soundly that is often seen in this environment.  Technician comment was that her diagnosis was made 15 years ago and at  that time she was on medication for narcolepsy which has been  unavailable in recent years.  She has been  using caffeine.  Very old  records are not available through Sleep Center files.  No significant  movement or respiratory sleep disturbance was recorded during this  study.  Consider return for a multiple sleep  latency test done as a daytime study to objectively measure daytime  sleepiness.  Formal consultation is available if helpful.      Clinton D. Maple Hudson, MD, [facility], FACP  Diplomate, Biomedical engineer of Sleep Medicine  Electronically Signed     CDY/MEDQ  D:  05/05/2007 09:02:16  T:  05/05/2007 09:22:51  Job:  [id number]

## 2010-06-03 NOTE — Op Note (Signed)
NAMESHIRLETTE, SCARBER             ACCOUNT NO.:  [id number]   MEDICAL RECORD NO.:  [id number]          PATIENT TYPE:  AMB   LOCATION:  SDS                          FACILITY:  [facility]   PHYSICIAN:  Alford Highland. Rankin, M.D.   DATE OF BIRTH:  1955/10/19   DATE OF PROCEDURE:  09/21/2004  DATE OF DISCHARGE:  09/21/2004                                 OPERATIVE REPORT   PREOPERATIVE DIAGNOSIS:  1.  Retinal detachment of the right eye, status post primary repair with      scleral buckle, right eye.  2.  Proliferative vitreal retinopathy, stage C2.   POSTOPERATIVE DIAGNOSIS:  1.  Retinal detachment of the right eye, status post primary repair with      scleral buckle, right eye.  2.  Proliferative vitreal retinopathy, stage C2.   OPERATION PERFORMED:  1.  Posterior vitrectomy with membrane peel, right eye.  2.  Endolaser focal laser photocoagulation for retinopexy purposes, right      eye.  3.  Injection of vitreous substitute--temporary Perfluoron.  4.  Injection of vitreous substitute--C3F8 8%.   SURGEON:  Alford Highland. Rankin, M.D.   ANESTHESIA:  Local retrobulbar with monitored anesthesia control.   INDICATIONS FOR PROCEDURE:  The patient is a 55 year old woman with profound  vision loss, temporal retinal detachment, recurrent after primary repair had  been successful with scleral buckle.  The patient understands this an  attempt to reattach retina, remove scar tissue that has led to this  condition.  She understands the risks of anesthesia including the rare  occurrence of death but also to the eye including but not limited to  hemorrhage, infection, scarring, need for another surgery, no change in  vision, loss of vision and progression of disease despite intervention.  After appropriate signed consent was obtained, the patient was taken to the  operating room.   DESCRIPTION OF PROCEDURE:  In the operating room, appropriate monitoring was  followed by mild sedation.  0.75% Marcaine was  delivered 5 mL retrobulbar  followed by an additional 5 mL laterally in the fashion of modified Darel Hong.  The  periocular region was sterilely prepped and draped in the usual  ophthalmic fashion.  A lid speculum was applied.  Conjunctival openings were  made with a 25 gauge trocar system in the inferotemporal quadrant initially.  The infusion was visualized in the vitreous cavity and turned on.  Serial  trocars placed.  Core vitrectomy was then begun using the microscopic  guidance with the Biom attached.  Vitreous skirt trimmed 360 degrees.  Hole  was identified at the 9 o'clock position on the posterior slope of the  buckle.  There was a smaller hole at 11 o'clock.  To flatten the posterior  pole in the retina, decision was made to use temporary Perfluoron after the  vitreous skirt had been circumcised 360 degrees.  Perfluoron was placed to  flatten and to remove the subretinal fluid.  Fluid-air exchange carried out  overtop the Perfluoron and drainage carried out through the retinal hole.   Thereafter additional Perfluoron was placed and endolaser  panphotocoagulation placed  in the bed of detachment as well as around the  retinal breaks identified.  At this time, Perfluoron was removed from the  eye.  A Perfluoron-air exchange completed.  All Perfluoron was removed from  the eye.  At this time the retina was attached.  An air-C3F8 8% exchange  completed.  Superior trocar was removed and compression used to close the  sclerotomy sites.  The infusion removed and similarly closed.  Intraocular  pressure was found to be adequate.  Subconjunctival injection of Decadron  applied.  Conjunctival dehiscence in the inferotemporal quadrant was closed  with 7-0 Vicryl suture in interrupted fashion.  The patient tolerated the  procedure well without complication. The patient was then transferred to the  recovery room in good and stable condition.      Alford Highland Rankin, M.D.  Electronically  Signed     GAR/MEDQ  D:  09/21/2004  T:  09/22/2004  Job:  [id number]

## 2010-06-03 NOTE — Op Note (Signed)
NAMEWALLY, Mary Romero             ACCOUNT NO.:  [id number]   MEDICAL RECORD NO.:  [id number]          PATIENT TYPE:  OIB   LOCATION:  5506                         FACILITY:  [facility]   PHYSICIAN:  Alford Highland. Rankin, M.D.   DATE OF BIRTH:  [birth year]   DATE OF PROCEDURE:  08/15/2004  DATE OF DISCHARGE:                                 OPERATIVE REPORT   PREOPERATIVE DIAGNOSIS:  Rhegmatogenous retinal detachment, right eye.   POSTOPERATIVE DIAGNOSIS:  Rhegmatogenous retinal detachment, right eye.   PROCEDURES:  1.  Scleral buckle using 287 solid silicone explant of the right eye.  2.  Retinal cryopexy, right eye.  3.  External drainage of subretinal fluid, right eye.   SURGEON:  Alford Highland. Rankin, M.D.   ANESTHESIA:  General endotracheal anesthesia.   INDICATION FOR PROCEDURE:  The patient is a 55 year old woman who has  profound vision loss on the basis of rhegmatogenous retinal detachment,  macula-off, in the right eye.  The patient understands this is an attempt to  reattach the retina.  She understands the risks of anesthesia including the  rare occurrence of death and losses to the eye, including but not limited to  hemorrhage, infection, scarring, need for further surgery, no change of  vision, loss of vision, and progressive disease despite intervention.  An  appropriate signed consent was obtained.   She was taken to the operating room.  In the operating room, appropriate  monitoring was followed by mild sedation.  General endotracheal anesthesia  was instituted without difficulty.  The right periocular region was  sterilely prepped and draped in the usual ophthalmic fashion.  A lid  speculum applied.  The conjunctival peritomy was then opened from the 4  o'clock position temporally to the 1 o'clock position superonasally.  Rectus  muscles isolated on 2-0 silk ties, inferior, lateral and superior rectus.  The retinal cryopexy was applied with indirect ophthalmoscopy to retinal  tear located at the 6:30 position as well as at the 11 o'clock position.  The 11 o'clock position had lattice degeneration with a bed of retinal  holes.   At this time a solid silicone 287 solid-silicone explant was selected.  It  was placed in the inferior, lateral rectus muscles and secured temporarily  in the temporal quadrants with 5-0 Mersilene mattress sutures.  These were  tied temporarily and then under direct visualization, external drainage of  subretinal fluid was then carried out atraumatically at the 9:30 position.  All subretinal fluid was removed in this fashion.  The vitreous remained  clear.  At this time 5-0 Mersilene mattress sutures were then tied  permanently.  The appropriate placement of the buckle was verified.  At this  time the conjunctiva and Tenon's were brought forward and closed with  interrupted layers of 7-0 Vicryl.  The bed of the buckle had been irrigated  with bug juice.  Subconjunctival injection of antibiotics and steroid  applied.  At this time the conjunctiva had been closed.  Subconjunctival  injection of antibiotics and steroid  had been applied.  Sterile patch and Fox shield applied.  The optic  nerve  had been visualized and had been confirmed to be perfused.  The patient was  awakened from anesthesia and taken to the recovery room in good, stable  condition.  She tolerated the procedure well without complications.      Alford Highland Rankin, M.D.  Electronically Signed     GAR/MEDQ  D:  08/15/2004  T:  08/16/2004  Job:  [id number]

## 2010-10-12 ENCOUNTER — Telehealth: Payer: Self-pay | Admitting: Family Medicine

## 2010-10-12 NOTE — Telephone Encounter (Signed)
Pt called and has questions re: ?getting her 55 yr old son est with Dr Caryl Never and getting np physical and labs. Her son goes to see a psychologist that has told him that he would need to get family dr in order to get the meds the psychologist is recommending. Would Dr Caryl Never feel comfortable prescribing these types of meds?

## 2010-10-13 NOTE — Telephone Encounter (Signed)
Depends on the type of med.  Why don't we see to establish and can discuss then.

## 2010-10-13 NOTE — Telephone Encounter (Signed)
Called pt and told her that Dr Caryl Never said that it depends on the type of med that the pschologist is recommended and that her son would have to call and sch a np ov to discuss. Pt said that she would notify her son and have him call back to sch ov.

## 2011-04-24 ENCOUNTER — Ambulatory Visit: Payer: Self-pay | Admitting: Obstetrics and Gynecology

## 2011-05-18 ENCOUNTER — Other Ambulatory Visit: Payer: Self-pay | Admitting: Obstetrics and Gynecology

## 2011-05-18 DIAGNOSIS — Z1231 Encounter for screening mammogram for malignant neoplasm of breast: Secondary | ICD-10-CM

## 2011-05-22 ENCOUNTER — Ambulatory Visit: Payer: Self-pay | Admitting: Obstetrics and Gynecology

## 2011-06-01 ENCOUNTER — Encounter: Payer: Self-pay | Admitting: Obstetrics and Gynecology

## 2011-06-01 ENCOUNTER — Ambulatory Visit (INDEPENDENT_AMBULATORY_CARE_PROVIDER_SITE_OTHER): Payer: 59 | Admitting: Obstetrics and Gynecology

## 2011-06-01 VITALS — BP 120/80 | Ht 67.0 in | Wt 229.0 lb

## 2011-06-01 DIAGNOSIS — Z01419 Encounter for gynecological examination (general) (routine) without abnormal findings: Secondary | ICD-10-CM

## 2011-06-01 DIAGNOSIS — E669 Obesity, unspecified: Secondary | ICD-10-CM

## 2011-06-01 DIAGNOSIS — Z124 Encounter for screening for malignant neoplasm of cervix: Secondary | ICD-10-CM

## 2011-06-01 NOTE — Progress Notes (Signed)
Last Pap: 12/11 WNL: Yes Regular Periods:no Contraception: none  Monthly Breast exam:no Tetanus<83yrs:yes Nl.Bladder Function:yes Daily BMs:yes Healthy Diet:yes Calcium:yes Mammogram:yes Exercise:yes Seatbelt: yes Abuse at home: no Stressful work:no Sigmoid-colonoscopy: 5 yrs ago wnl per pt Bone Density: No  PMH: No Change FMH: No Change   Subjective:    Mary Romero is a 56 y.o. female, No obstetric history on file., who presents for an annual exam. See above. She has fibroids.  Prior Hysterectomy: No    History   Social History  . Marital Status: Married    Spouse Name: N/A    Number of Children: N/A  . Years of Education: N/A   Social History Main Topics  . Smoking status: Former Smoker -- 0.5 packs/day for 25 years    Types: Cigarettes    Quit date: 04/25/1999  . Smokeless tobacco: Not on file  . Alcohol Use: Yes  . Drug Use: No  . Sexually Active: Yes   Other Topics Concern  . Not on file   Social History Narrative  . No narrative on file    Menstrual cycle:   LMP: No LMP recorded. Patient is not currently having periods (Reason: Perimenopausal).           Cycle: none  The following portions of the patient's history were reviewed and updated as appropriate: allergies, current medications, past family history, past medical history, past social history, past surgical history and problem list.  Review of Systems Pertinent items are noted in HPI. Breast:Negative for breast lump,nipple discharge or nipple retraction Gastrointestinal: Negative for abdominal pain, change in bowel habits or rectal bleeding Urinary:negative   Objective:    BP 120/80  Ht 5\' 7"  (1.702 m)  Wt 229 lb (103.874 kg)  BMI 35.87 kg/m2    Weight:  Wt Readings from Last 1 Encounters:  06/01/11 229 lb (103.874 kg)          BMI: Body mass index is 35.87 kg/(m^2).  General Appearance: Alert, appropriate appearance for age. No acute distress HEENT: Grossly normal Neck /  Thyroid: Supple, no masses, nodes or enlargement Lungs: clear to auscultation bilaterally Back: No CVA tenderness Breast Exam: No masses or nodes.No dimpling, nipple retraction or discharge. Cardiovascular: Regular rate and rhythm. S1, S2, no murmur Gastrointestinal: Soft, non-tender, no masses or organomegaly  ++++++++++++++++++++++++++++++++++++++++++++++++++++++++  Pelvic Exam: External genitalia: normal general appearance Vaginal: normal without tenderness, induration or masses and relaxation noted Cervix: normal appearance Adnexa: normal bimanual exam Uterus: 14 week size and irregular Rectovaginal: normal rectal, no masses  ++++++++++++++++++++++++++++++++++++++++++++++++++++++++  Lymphatic Exam: Non-palpable nodes in neck, clavicular, axillary, or inguinal regions Neurologic: Normal speech, no tremor  Psychiatric: Alert and oriented, appropriate affect.   Wet Prep:not applicable Urinalysis:not applicable UPT: Not done   Assessment:    Normal gyn exam   Fibroid uterus.  Stable.  Overweight or obese: Yes   Pelvic relaxation: Yes   Plan:    mammogram pap smear return annually or prn Contraception:perimenopause    STD screen request: none  RPR: No.   HBsAg: No.  Hepatitis C: No.  The updated Pap smear screening guidelines were discussed with the patient. The patient requested that I obtain a Pap smear: Yes.  Kegel exercises discussed: Yes.  Proper diet and regular exercise were reviewed.  Annual mammograms recommended starting at age 28. Proper breast care was discussed.  Screening colonoscopy is recommended beginning at age 62.  Regular health maintenance was reviewed.  Sleep hygiene was discussed.  Adequate calcium and  vitamin D intake was emphasized.  Fibroids and menopause discussed.  Mylinda Latina.D.

## 2011-06-05 LAB — PAP IG W/ RFLX HPV ASCU

## 2011-06-07 ENCOUNTER — Other Ambulatory Visit: Payer: Self-pay | Admitting: Obstetrics and Gynecology

## 2011-06-07 NOTE — Telephone Encounter (Signed)
Latoya/avs pt/epic

## 2011-06-08 ENCOUNTER — Other Ambulatory Visit: Payer: Self-pay

## 2011-06-08 ENCOUNTER — Telehealth: Payer: Self-pay

## 2011-06-08 DIAGNOSIS — A6000 Herpesviral infection of urogenital system, unspecified: Secondary | ICD-10-CM

## 2011-06-08 MED ORDER — VALACYCLOVIR HCL 500 MG PO TABS: 500.0000 mg | ORAL_TABLET | Freq: Two times a day (BID) | ORAL | Status: AC

## 2011-06-08 NOTE — Telephone Encounter (Signed)
Pt called back to confirm Valtrex 500mg  Rx was fine Rx called in To her pharm per Fayrene Fearing. LC/CMA

## 2011-06-08 NOTE — Telephone Encounter (Signed)
Pt was called to confirm Acyclovir Rx pt was not ava voicemail left for pt to call back regarding Rx.[facility] CMA

## 2011-06-16 ENCOUNTER — Telehealth: Payer: Self-pay | Admitting: Family Medicine

## 2011-06-16 NOTE — Telephone Encounter (Signed)
added

## 2011-06-16 NOTE — Telephone Encounter (Signed)
Pt is sch to have cpx labs drawn on 06/26/11. Pt req to add Vit D lvls to lab. Pls order.

## 2011-06-20 ENCOUNTER — Ambulatory Visit (HOSPITAL_COMMUNITY)
Admission: RE | Admit: 2011-06-20 | Discharge: 2011-06-20 | Disposition: A | Discharge status: Home / Self Care | Payer: 59 | Source: Ambulatory Visit | Attending: Obstetrics and Gynecology | Admitting: Obstetrics and Gynecology

## 2011-06-20 DIAGNOSIS — Z1231 Encounter for screening mammogram for malignant neoplasm of breast: Secondary | ICD-10-CM | POA: Insufficient documentation

## 2011-06-22 ENCOUNTER — Encounter: Payer: Self-pay | Admitting: Obstetrics and Gynecology

## 2011-06-26 ENCOUNTER — Telehealth: Payer: Self-pay | Admitting: Obstetrics and Gynecology

## 2011-06-26 ENCOUNTER — Other Ambulatory Visit (INDEPENDENT_AMBULATORY_CARE_PROVIDER_SITE_OTHER): Payer: 59

## 2011-06-26 DIAGNOSIS — Z0000 Encounter for general adult medical examination without abnormal findings: Secondary | ICD-10-CM

## 2011-06-26 LAB — CBC WITH DIFFERENTIAL/PLATELET
Basophils Relative: 0.7 % (ref 0.0–3.0)
Eosinophils Relative: 3 % (ref 0.0–5.0)
HCT: 40.6 % (ref 36.0–46.0)
Lymphocytes Relative: 36.3 % (ref 12.0–46.0)
Lymphs Abs: 1.9 10*3/uL (ref 0.7–4.0)
MCV: 89 fl (ref 78.0–100.0)
Platelets: 216 10*3/uL (ref 150.0–400.0)
WBC: 5.3 10*3/uL (ref 4.5–10.5)

## 2011-06-26 LAB — HEPATIC FUNCTION PANEL
AST: 16 U/L (ref 0–37)
Albumin: 3.9 g/dL (ref 3.5–5.2)
Bilirubin, Direct: 0 mg/dL (ref 0.0–0.3)
Total Protein: 7.8 g/dL (ref 6.0–8.3)

## 2011-06-26 LAB — BASIC METABOLIC PANEL
BUN: 15 mg/dL (ref 6–23)
CO2: 26 mEq/L (ref 19–32)
Chloride: 106 mEq/L (ref 96–112)
Creatinine, Ser: 0.8 mg/dL (ref 0.4–1.2)
Glucose, Bld: 124 mg/dL — ABNORMAL HIGH (ref 70–99)
Sodium: 141 mEq/L (ref 135–145)

## 2011-06-26 LAB — LIPID PANEL: HDL: 61.4 mg/dL (ref >39.00)

## 2011-06-26 NOTE — Telephone Encounter (Signed)
Triage/epic 

## 2011-06-27 ENCOUNTER — Telehealth: Payer: Self-pay

## 2011-06-27 NOTE — Telephone Encounter (Signed)
Pt was called and made aware of seminar that will be held in the office regarding mammogram letters about "Dense Breast" Pt stated she will call back to confirm which date she will attend. [facility] CMA

## 2011-06-27 NOTE — Progress Notes (Signed)
Quick Note:  Pt informed on home VM ______ 

## 2011-06-28 ENCOUNTER — Encounter: Payer: BC Managed Care – PPO | Admitting: Family Medicine

## 2011-06-28 ENCOUNTER — Telehealth: Payer: Self-pay

## 2011-06-28 NOTE — Telephone Encounter (Signed)
Can you call this pt? 

## 2011-06-28 NOTE — Telephone Encounter (Signed)
Pt was called back today to confirm what date she would be attending Mammogram "Dense Breast  Letter seminar". Pt confirmed 07-12-2011 @ 4:15pm

## 2011-07-19 ENCOUNTER — Encounter: Payer: BC Managed Care – PPO | Admitting: Family Medicine

## 2011-08-04 ENCOUNTER — Other Ambulatory Visit: Payer: Self-pay | Admitting: Family Medicine

## 2011-08-04 ENCOUNTER — Ambulatory Visit (INDEPENDENT_AMBULATORY_CARE_PROVIDER_SITE_OTHER): Payer: 59 | Admitting: Family Medicine

## 2011-08-04 ENCOUNTER — Encounter: Payer: Self-pay | Admitting: Family Medicine

## 2011-08-04 VITALS — BP 138/98 | Temp 97.9°F | Ht 66.5 in | Wt 233.0 lb

## 2011-08-04 DIAGNOSIS — R7303 Prediabetes: Secondary | ICD-10-CM

## 2011-08-04 DIAGNOSIS — R7309 Other abnormal glucose: Secondary | ICD-10-CM

## 2011-08-04 DIAGNOSIS — Z0000 Encounter for general adult medical examination without abnormal findings: Secondary | ICD-10-CM

## 2011-08-04 DIAGNOSIS — E559 Vitamin D deficiency, unspecified: Secondary | ICD-10-CM

## 2011-08-04 NOTE — Progress Notes (Signed)
Subjective:    Patient ID: Mary Romero, female    DOB: [birth year], 56 y.o.   MRN: [id number]  HPI  Patient seen for complete physical. She continues to see gynecologist. Recent Pap smear unremarkable. Recent mammogram unremarkable. Colonoscopy up-to-date. Immunizations up-to-date.  She's had some weight gain this past year. No recent exercise. Strong family history of diabetes and hypertension. Patient's never been treated for hypertension. No recent headaches. No dizziness. No chest pains.  Past Medical History  Diagnosis Date  . 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 TINNITUS 12/22/2008  . ALLERGIC RHINITIS 04/23/2009  . VITAMIN D DEFICIENCY 12/27/2009  . Depression   . Fibroid 2004  . BMI 37.0-37.9,adult 02/15/10  . Irregular menses 02/15/10  . H/O menorrhagia 2002  . ASCUS (atypical squamous cells of undetermined significance) on Pap smear 10/1999  . Perimenopause 09/2001  . Herpes genitalia 09/2001  . H/O dysmenorrhea 10/09/01  . ANEMIA-IRON DEFICIENCY 10/09/01  . H/O vaginal discharge 10/09/01    R/O std  . H/O vaginitis 06/17/02  . Dysuria 2004  . Fatigue   . H/O abdominal pain 03/2007  . PRB (rectal bleeding) 04/2007  . Post-menopausal bleeding 05/2007  . Irregular periods/menstrual cycles 6/09  . BV (bacterial vaginosis) 06/2007  . Hx: UTI (urinary tract infection) 11/2007  . H/O: obesity 12/20/09  . Endometrial polyp 12/23/09   Past Surgical History  Procedure Date  . Eye surgery 2006    X 2  . Dilation and curettage of uterus 02/15/10  . Hysteroscopy 02/15/10    reports that she quit smoking about 12 years ago. Her smoking use included Cigarettes. She has a 12.5 pack-year smoking history. She does not have any smokeless tobacco history on file. She reports that she drinks alcohol. She reports that she does not use illicit drugs. family history includes Alzheimer's disease in her mother; Cancer in her other; Diabetes in her mother; Heart disease in her  father; and Hypertension in her father and mother. Allergies  Allergen Reactions  . Codeine Sulfate     REACTION: "out of body experience"  . Latex       Review of Systems  Constitutional: Negative for fever, activity change, appetite change and fatigue.  HENT: Negative for hearing loss, ear pain, sore throat and trouble swallowing.   Eyes: Negative for visual disturbance.  Respiratory: Negative for cough and shortness of breath.   Cardiovascular: Negative for chest pain and palpitations.  Gastrointestinal: Negative for abdominal pain, diarrhea, constipation and blood in stool.  Genitourinary: Negative for dysuria and hematuria.  Musculoskeletal: Negative for myalgias, back pain and arthralgias.  Skin: Negative for rash.  Neurological: Negative for dizziness, syncope and headaches.  Hematological: Negative for adenopathy.  Psychiatric/Behavioral: Negative for confusion and dysphoric mood.       Objective:   Physical Exam  Constitutional: She is oriented to person, place, and time. She appears well-developed and well-nourished. No distress.  HENT:  Head: Normocephalic and atraumatic.  Eyes: EOM are normal. Pupils are equal, round, and reactive to light.  Neck: Normal range of motion. Neck supple. No thyromegaly present.  Cardiovascular: Normal rate, regular rhythm and normal heart sounds.   No murmur heard. Pulmonary/Chest: Effort normal and breath sounds normal. No respiratory distress. She has no wheezes. She has no rales.  Abdominal: Soft. Bowel sounds are normal. She exhibits no distension and no mass. There is no tenderness. There is no rebound and no guarding.  Genitourinary:  Per GYN  Musculoskeletal: Normal range of motion. She exhibits no edema.  Lymphadenopathy:    She has no cervical adenopathy.  Neurological: She is alert and oriented to person, place, and time. She displays normal reflexes. No cranial nerve deficit.  Skin: No rash noted.  Psychiatric: She  has a normal mood and affect. Her behavior is normal. Judgment and thought content normal.          Assessment & Plan:  Complete physical. Labs reviewed with patient. Significant for glucose 124. Lipids were favorable. Work on weight loss and establishing consistent exercise. Home glucose monitor given to monitor periodically. Recommend followup in 6 months and reassess blood sugar readings and blood pressure at that time.

## 2011-08-04 NOTE — Patient Instructions (Addendum)
Work on weight loss and establishing consistent exercise Consider followup in 6 months to reassess Monitor blood pressure and be in touch if this is consistently over 140/90 Monitor blood sugars at least every 2-3 weeks and be in touch if fasting blood sugars consistently over 140

## 2011-09-13 ENCOUNTER — Encounter: Payer: Self-pay | Admitting: Family Medicine

## 2011-09-13 ENCOUNTER — Ambulatory Visit (INDEPENDENT_AMBULATORY_CARE_PROVIDER_SITE_OTHER): Payer: 59 | Admitting: Family Medicine

## 2011-09-13 VITALS — BP 142/92 | Temp 98.2°F | Wt 225.0 lb

## 2011-09-13 DIAGNOSIS — R42 Dizziness and giddiness: Secondary | ICD-10-CM

## 2011-09-13 DIAGNOSIS — IMO0001 Reserved for inherently not codable concepts without codable children: Secondary | ICD-10-CM

## 2011-09-13 DIAGNOSIS — R030 Elevated blood-pressure reading, without diagnosis of hypertension: Secondary | ICD-10-CM

## 2011-09-13 LAB — CBC WITH DIFFERENTIAL/PLATELET
Eosinophils Relative: 2.7 % (ref 0.0–5.0)
HCT: 41 % (ref 36.0–46.0)
Lymphocytes Relative: 40.2 % (ref 12.0–46.0)
Lymphs Abs: 2.7 10*3/uL (ref 0.7–4.0)
Monocytes Relative: 8.6 % (ref 3.0–12.0)
Platelets: 247 10*3/uL (ref 150.0–400.0)
WBC: 6.7 10*3/uL (ref 4.5–10.5)

## 2011-09-13 NOTE — Progress Notes (Signed)
Subjective:    Patient ID: Mary Romero, female    DOB: May 10, 1955, 56 y.o.   MRN: [id number]  HPI  Patient seen with some dizziness for the past several weeks. Felt similar with anemia in the past. She is perimenopausal. No obvious loss of blood from stools. No recent abdominal pain. Her dizziness is described as lightheadedness. Not clearly orthostatic or position related. No vertigo. No syncope. She also remembers similar symptoms in the past with allergy symptoms. She's taken some Allegra recently which did not seem to help. Denies any active nasal congestion. No fever. No chills. No appetite changes. She is attempting to lose some weight. Started coffee bean extract recently. She brings in packets today and this does not list caffeine content  Denies focal neurologic symptoms such as weakness or any ataxia. No visual difficulties. Currently takes no regular prescription medications. Had recent labs back in June with hemoglobin normal range  Past Medical History  Diagnosis Date  . 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 TINNITUS 12/22/2008  . ALLERGIC RHINITIS 04/23/2009  . VITAMIN D DEFICIENCY 12/27/2009  . Depression   . Fibroid 2004  . BMI 37.0-37.9,adult 02/15/10  . Irregular menses 02/15/10  . H/O menorrhagia 2002  . ASCUS (atypical squamous cells of undetermined significance) on Pap smear 10/1999  . Perimenopause 09/2001  . Herpes genitalia 09/2001  . H/O dysmenorrhea 10/09/01  . ANEMIA-IRON DEFICIENCY 10/09/01  . H/O vaginal discharge 10/09/01    R/O std  . H/O vaginitis 06/17/02  . Dysuria 2004  . Fatigue   . H/O abdominal pain 03/2007  . PRB (rectal bleeding) 04/2007  . Post-menopausal bleeding 05/2007  . Irregular periods/menstrual cycles 6/09  . BV (bacterial vaginosis) 06/2007  . Hx: UTI (urinary tract infection) 11/2007  . H/O: obesity 12/20/09  . Endometrial polyp 12/23/09   Past Surgical History  Procedure Date  . Eye surgery 2006    X 2  .  Dilation and curettage of uterus 02/15/10  . Hysteroscopy 02/15/10    reports that she quit smoking about 12 years ago. Her smoking use included Cigarettes. She has a 12.5 pack-year smoking history. She does not have any smokeless tobacco history on file. She reports that she drinks alcohol. She reports that she does not use illicit drugs. family history includes Alzheimer's disease in her mother; Cancer in her other; Diabetes in her mother; Heart disease in her father; and Hypertension in her father and mother. Allergies  Allergen Reactions  . Codeine Sulfate     REACTION: "out of body experience"  . Latex       Review of Systems  Constitutional: Negative for appetite change and unexpected weight change.  Eyes: Negative for visual disturbance.  Respiratory: Negative for cough and shortness of breath.   Cardiovascular: Negative for chest pain.  Gastrointestinal: Negative for nausea, vomiting, abdominal pain, diarrhea and blood in stool.  Genitourinary: Negative for dysuria.  Neurological: Positive for dizziness. Negative for syncope, weakness and headaches.  Hematological: Negative for adenopathy. Does not bruise/bleed easily.       Objective:   Physical Exam  Constitutional: She is oriented to person, place, and time. She appears well-developed and well-nourished.  Eyes:       Conjunctivae are pink  Neck: Neck supple. No thyromegaly present.  Cardiovascular: Normal rate and regular rhythm.   Pulmonary/Chest: Effort normal and breath sounds normal. No respiratory distress. She has no wheezes. She has no rales.  Musculoskeletal: She exhibits no edema.  Neurological: She is alert and oriented to person, place, and time. No cranial nerve deficit. Coordination normal.          Assessment & Plan:  Nonspecific dizziness. No orthostasis. Blood pressure borderline high. Doubt clinical anemia but she states she felt similar in the past (with anemia). No obvious bleeding problems  recently. Check CBC. Continue weight loss efforts. We have followup scheduled in about 4 months and reassess blood pressure then. Consider blood pressure medication if not consistently below 140/90 at that time

## 2011-09-14 NOTE — Progress Notes (Signed)
Quick Note:  Pt informed and she asked, "so what do I do about the dizziness I am experiencing". ______

## 2011-09-15 ENCOUNTER — Telehealth: Payer: Self-pay | Admitting: Family Medicine

## 2011-09-15 NOTE — Telephone Encounter (Signed)
Pt called re: lab results. Pt said that there has been a change in her condition. Pt is wondering if she may be Vit B-12 deficient? Pt ate some beef and felt better afterwards. Pt is wondering if she can get a  b-12 inj today?

## 2011-09-15 NOTE — Telephone Encounter (Signed)
Even if she were B12 deficient, eating beef once would not remedy this. There would be no harm in taking over-the-counter B12 500 mg once daily but deficiency is very unlikely in a nonvegetarian

## 2011-09-15 NOTE — Telephone Encounter (Signed)
Pt was questioning what to do next after I called her about her labs yesterday and asked, "so what is causing my lightheadedness/dizziness"  This phone call is coming in response to that I believe.  Please advise, OTC Vit B supplement?

## 2011-09-15 NOTE — Telephone Encounter (Signed)
Pt informed on VM

## 2011-09-19 ENCOUNTER — Telehealth: Payer: Self-pay | Admitting: Family Medicine

## 2011-09-19 DIAGNOSIS — R42 Dizziness and giddiness: Secondary | ICD-10-CM

## 2011-09-19 NOTE — Telephone Encounter (Signed)
Pt req referral to specialist re: issues with B-12 deficiencies.

## 2011-09-20 NOTE — Telephone Encounter (Signed)
Pt informed on VM labs ordered, call for lab appt

## 2011-09-20 NOTE — Telephone Encounter (Signed)
We have not screened B12 yet. First step is to check B12 level and if this is normal there were be no need to send to specialist. Also, if she has B12 deficiency this could be easily replaced

## 2011-09-27 ENCOUNTER — Telehealth: Payer: Self-pay | Admitting: *Deleted

## 2011-09-27 NOTE — Telephone Encounter (Signed)
Pt calling again to request referral to ENT, does not want or feel the need to see Dr Caryl Never again.  In review of earlier phone calls, encouraged pt to come in for lab, B-12 check.  I did leave a message re: lab has been ordered, please have this done first.

## 2011-09-29 ENCOUNTER — Other Ambulatory Visit: Payer: 59

## 2011-10-04 NOTE — Telephone Encounter (Signed)
Pt has not called back or responded to my message, lab has not been done yet as of today

## 2011-11-03 ENCOUNTER — Ambulatory Visit (INDEPENDENT_AMBULATORY_CARE_PROVIDER_SITE_OTHER): Payer: 59 | Admitting: Internal Medicine

## 2011-11-03 ENCOUNTER — Encounter: Payer: Self-pay | Admitting: Internal Medicine

## 2011-11-03 ENCOUNTER — Other Ambulatory Visit: Payer: 59

## 2011-11-03 VITALS — BP 120/90 | Temp 98.4°F | Wt 240.0 lb

## 2011-11-03 DIAGNOSIS — S8000XA Contusion of unspecified knee, initial encounter: Secondary | ICD-10-CM

## 2011-11-03 DIAGNOSIS — R7309 Other abnormal glucose: Secondary | ICD-10-CM

## 2011-11-03 DIAGNOSIS — Z23 Encounter for immunization: Secondary | ICD-10-CM

## 2011-11-03 DIAGNOSIS — R42 Dizziness and giddiness: Secondary | ICD-10-CM

## 2011-11-03 DIAGNOSIS — S8001XA Contusion of right knee, initial encounter: Secondary | ICD-10-CM

## 2011-11-03 DIAGNOSIS — E559 Vitamin D deficiency, unspecified: Secondary | ICD-10-CM

## 2011-11-03 LAB — BASIC METABOLIC PANEL
CO2: 29 mEq/L (ref 19–32)
Chloride: 105 mEq/L (ref 96–112)
Creatinine, Ser: 0.7 mg/dL (ref 0.4–1.2)
Potassium: 3.9 mEq/L (ref 3.5–5.1)
Sodium: 140 mEq/L (ref 135–145)

## 2011-11-03 NOTE — Patient Instructions (Signed)
You  may move around, but avoid painful motions and activities.  Apply ice to the sore area for 15 to 20 minutes 3 or 4 times daily for the next two to 3 days.  Continue ibuprofen  Call or return to clinic prn if these symptoms worsen or fail to improve as anticipated.

## 2011-11-03 NOTE — Progress Notes (Signed)
Subjective:    Patient ID: Mary Romero, female    DOB: 10-25-1955, 56 y.o.   MRN: [id number]  HPI  56 year old patient who is seen today with a chief complaint of right knee pain. She fell leaving a restaurant at approximately noon yesterday when she states that she tripped over a irregularity in the carpet. She has had pain involving the right posterior knee. Pain is aggravated by prolonged immobility and seems to be alleviated by activities such as walking. She has been applying ice frequently over the past 24 hours and also using ibuprofen 800 mg 3 times a day  She is also being followed by allergy and is asking about an ENT referral. She is having some concerns about her voice and also sinus pain with air travel. She will discuss this with allergy in 3 weeks. She was told that we will be happy to refer her to ENT if allergy thinks this might be helpful  Past Medical History  Diagnosis Date  . 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 TINNITUS 12/22/2008  . ALLERGIC RHINITIS 04/23/2009  . VITAMIN D DEFICIENCY 12/27/2009  . Depression   . Fibroid 2004  . BMI 37.0-37.9,adult 02/15/10  . Irregular menses 02/15/10  . H/O menorrhagia 2002  . ASCUS (atypical squamous cells of undetermined significance) on Pap smear 10/1999  . Perimenopause 09/2001  . Herpes genitalia 09/2001  . H/O dysmenorrhea 10/09/01  . ANEMIA-IRON DEFICIENCY 10/09/01  . H/O vaginal discharge 10/09/01    R/O std  . H/O vaginitis 06/17/02  . Dysuria 2004  . Fatigue   . H/O abdominal pain 03/2007  . PRB (rectal bleeding) 04/2007  . Post-menopausal bleeding 05/2007  . Irregular periods/menstrual cycles 6/09  . BV (bacterial vaginosis) 06/2007  . Hx: UTI (urinary tract infection) 11/2007  . H/O: obesity 12/20/09  . Endometrial polyp 12/23/09    History   Social History  . Marital Status: Married    Spouse Name: N/A    Number of Children: N/A  . Years of Education: N/A   Occupational History  . Not on  file.   Social History Main Topics  . Smoking status: Former Smoker -- 0.5 packs/day for 25 years    Types: Cigarettes    Quit date: 04/25/1999  . Smokeless tobacco: Not on file  . Alcohol Use: Yes  . Drug Use: No  . Sexually Active: Yes   Other Topics Concern  . Not on file   Social History Narrative  . No narrative on file    Past Surgical History  Procedure Date  . Eye surgery 2006    X 2  . Dilation and curettage of uterus 02/15/10  . Hysteroscopy 02/15/10    Family History  Problem Relation Age of Onset  . Diabetes Mother   . Hypertension Mother   . Alzheimer's disease Mother   . Heart disease Father     CHF  . Hypertension Father   . Cancer Other     stomach, kidney    Allergies  Allergen Reactions  . Codeine Sulfate     REACTION: "out of body experience"  . Latex     Current Outpatient Prescriptions on File Prior to Visit  Medication Sig Dispense Refill  . Calcium Carbonate (CALCIUM 600) 1500 MG TABS Take by mouth. One tab by mouth every other day       . ergocalciferol (VITAMIN D2) 50000 UNITS capsule Take 50,000 Units by mouth once a week.        Marland Kitchen  fexofenadine (ALLEGRA) 180 MG tablet Take 180 mg by mouth as needed.      . valACYclovir (VALTREX) 500 MG tablet Take 500 mg by mouth as needed.         BP 120/90  Temp 98.4 F (36.9 C) (Oral)  Wt 240 lb (108.863 kg)      Review of Systems  HENT: Positive for congestion and voice change.   Musculoskeletal: Arthralgias: right knee pain.       Objective:   Physical Exam  Musculoskeletal:       Very minimal tenderness over the right patella;  the right knee is slightly warmer to touch. No obvious effusion. The patient is able to ambulate without pain Full leg extension          Assessment & Plan:   Contusion right knee. The patient will continue ibuprofen rest and ice compresses over the next 24 hours. We'll call if there is any clinical deterioration.  Low clinical suspicion for patellar  fracture, meniscal tear etc.

## 2011-11-04 LAB — VITAMIN D 25 HYDROXY (VIT D DEFICIENCY, FRACTURES): Vit D, 25-Hydroxy: 21 ng/mL — ABNORMAL LOW (ref 30–89)

## 2011-11-07 ENCOUNTER — Other Ambulatory Visit: Payer: Self-pay | Admitting: *Deleted

## 2011-11-07 DIAGNOSIS — E559 Vitamin D deficiency, unspecified: Secondary | ICD-10-CM

## 2011-11-07 MED ORDER — VITAMIN D (ERGOCALCIFEROL) 1.25 MG (50000 UNIT) PO CAPS: 50000.0000 [IU] | ORAL_CAPSULE | ORAL | Status: DC

## 2011-11-07 NOTE — Progress Notes (Signed)
Quick Note:  Pt informed on personally identified VM ______ 

## 2011-11-09 ENCOUNTER — Telehealth: Payer: Self-pay | Admitting: Family Medicine

## 2011-11-09 NOTE — Telephone Encounter (Signed)
Caller: Mary Romero; PCP: Evelena Peat (Family Practice); CB#: [phone]; Call regarding Leg injury--what to do now since sx s are not imrproving? Pt wants referral to PT if MD thinks it would help. She says her sx's are "slightly improved" but still has pain and has to pick up her leg with her hands to get in and out of car and has a sensation that knee is "giving out" sometimes.  Rn reviewed health history and recent office visit per Dr Amador Cunas on 10/18 for knee pain.  Cell phone is [phone]. Alternate phone # at work is [phone]. Pt notified not to expect a call back today--office closed since she placed her call.

## 2011-11-10 ENCOUNTER — Telehealth: Payer: Self-pay | Admitting: Family Medicine

## 2011-11-10 NOTE — Telephone Encounter (Signed)
[facility] Triage Call Report Triage Record Num: [id number] Operator: Estevan Oaks Patient Name: Mary Romero Call Date & Time: 11/09/2011 5:16:34PM Patient Phone: [phone] PCP: Patient Gender: Female PCP Fax : Patient DOB: 04-21-1955 [facility] Reason for Call: Caller: Deaysia/Patient; PCP: Evelena Peat (Family Practice); CB#: [phone]; Call regarding Leg injury--what to do now since sx s are not imrproving? Pt wants referral to PT if MD thinks it would help. She says her sx's are "slightly improved" but still has pain and has to pick up her leg with her hands to get in and out of car and has a sensation that knee is "giving out" sometimes. Rn reviewed health history and recent office visit per Dr Amador Cunas on 10/18 for knee pain. Cell phone is [phone]. Alternate phone # at work is [phone]. Pt notified not to expect a call back today--office closed since she placed her call. Rn sent note to clinical pool via Epic Protocol(s) Used: Knee Injury Recommended Outcome per Protocol: Provide Home/Self Care Reason for Outcome: All other situations Care Advice: ~ 10/24/

## 2011-11-12 NOTE — Telephone Encounter (Signed)
I recommend office follow up in one week to reassess if no improvement.  I'm not sure PT would help much based on prior office note per Dr Kirtland Bouchard.  Make sure she has tried some type of anti inflammatory such as Aleve.

## 2011-11-13 ENCOUNTER — Telehealth: Payer: Self-pay | Admitting: *Deleted

## 2011-11-13 MED ORDER — IBUPROFEN 800 MG PO TABS: 800.0000 mg | ORAL_TABLET | Freq: Three times a day (TID) | ORAL | Status: DC | PRN

## 2011-11-13 NOTE — Telephone Encounter (Signed)
Pt informed

## 2011-11-13 NOTE — Telephone Encounter (Signed)
Refill once #30 tablets but caution with long term use secondary to ulcer risk.

## 2011-11-13 NOTE — Telephone Encounter (Signed)
Pt informed on VM to call for re-evaluation OV sometime this week, recommended she try Aleve

## 2011-11-13 NOTE — Telephone Encounter (Signed)
Pt got the message re: return OV to re-evaluate back pain in one week.  Pt has been taking Ibuprofen 800 mg BID PRN which has been helping, pt requesting refill until OV.

## 2011-11-16 ENCOUNTER — Telehealth: Payer: Self-pay | Admitting: Obstetrics and Gynecology

## 2011-11-16 NOTE — Telephone Encounter (Signed)
Tc to pt, c/o irregular bleeding. Pt states she has had menstrual-like bleeding on 2-3 different occasions each month for the past couple of months. Pt only wants to be seen by [facility], 1st available is 12/18/11. Gave pt the option of possibly being evaluated sooner with a different provider, pt declines. Scheduled pt with [facility] for 1st available, pt agreeable.

## 2011-11-17 ENCOUNTER — Encounter: Payer: Self-pay | Admitting: Family Medicine

## 2011-11-17 ENCOUNTER — Ambulatory Visit (INDEPENDENT_AMBULATORY_CARE_PROVIDER_SITE_OTHER): Payer: 59 | Admitting: Family Medicine

## 2011-11-17 VITALS — BP 110/90 | Temp 97.9°F | Wt 236.0 lb

## 2011-11-17 DIAGNOSIS — H9209 Otalgia, unspecified ear: Secondary | ICD-10-CM

## 2011-11-17 DIAGNOSIS — M25561 Pain in right knee: Secondary | ICD-10-CM

## 2011-11-17 DIAGNOSIS — M25569 Pain in unspecified knee: Secondary | ICD-10-CM

## 2011-11-17 DIAGNOSIS — H9203 Otalgia, bilateral: Secondary | ICD-10-CM

## 2011-11-17 NOTE — Progress Notes (Signed)
Subjective:    Patient ID: Mary Romero, female    DOB: 11/07/1955, 56 y.o.   MRN: [id number]  HPI  Patient seen for the following issues  Right knee pain. Refer to prior note. She was in a restaurant and tripped over a rug on 11/02/2011. Apparently landed on both anterior knees. She recalls having some slight abrasions of both knees. Her pain has been predominantly right posterior knee. Initially had some mild swelling which is resolved. Pain is worse after prolonged periods of sitting and with extreme knee flexion. No significant bruising. No giving way. She initially tried some icing and compression and ibuprofen. Her knee pain is somewhat improved about 20-30% estimated. Still has significant pain after prolonged periods of sitting. No x-rays were done. No other significant injuries from the fall.  Patient requesting ENT referral. Frequent bilateral ear pain. Recently saw allergist and was told she had right sided effusion. No vertigo. Some nonspecific lightheadedness. No hearing changes. With flying she frequently has severe ear pain. No drainage. Patient also complains of difficulty losing her voice. She sings frequently. Does not describe any hoarseness with general talking. Nonsmoker. No active GERD symptoms.  Past Medical History  Diagnosis Date  . 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 TINNITUS 12/22/2008  . ALLERGIC RHINITIS 04/23/2009  . VITAMIN D DEFICIENCY 12/27/2009  . Depression   . Fibroid 2004  . BMI 37.0-37.9,adult 02/15/10  . Irregular menses 02/15/10  . H/O menorrhagia 2002  . ASCUS (atypical squamous cells of undetermined significance) on Pap smear 10/1999  . Perimenopause 09/2001  . Herpes genitalia 09/2001  . H/O dysmenorrhea 10/09/01  . ANEMIA-IRON DEFICIENCY 10/09/01  . H/O vaginal discharge 10/09/01    R/O std  . H/O vaginitis 06/17/02  . Dysuria 2004  . Fatigue   . H/O abdominal pain 03/2007  . PRB (rectal bleeding) 04/2007  . Post-menopausal  bleeding 05/2007  . Irregular periods/menstrual cycles 6/09  . BV (bacterial vaginosis) 06/2007  . Hx: UTI (urinary tract infection) 11/2007  . H/O: obesity 12/20/09  . Endometrial polyp 12/23/09   Past Surgical History  Procedure Date  . Eye surgery 2006    X 2  . Dilation and curettage of uterus 02/15/10  . Hysteroscopy 02/15/10    reports that she quit smoking about 12 years ago. Her smoking use included Cigarettes. She has a 12.5 pack-year smoking history. She does not have any smokeless tobacco history on file. She reports that she drinks alcohol. She reports that she does not use illicit drugs. family history includes Alzheimer's disease in her mother; Cancer in her other; Diabetes in her mother; Heart disease in her father; and Hypertension in her father and mother. Allergies  Allergen Reactions  . Codeine Sulfate     REACTION: "out of body experience"  . Latex       Review of Systems  Constitutional: Negative for fever and chills.  HENT: Positive for voice change. Negative for hearing loss, congestion, sore throat, trouble swallowing, postnasal drip, sinus pressure and ear discharge.   Respiratory: Negative for cough and shortness of breath.   Cardiovascular: Negative for chest pain.       Objective:   Physical Exam  Constitutional: She appears well-developed and well-nourished.  HENT:  Mouth/Throat: Oropharynx is clear and moist.       No visible effusion. Normal landmarks.  Neck: Neck supple.  Cardiovascular: Normal rate and regular rhythm.   Pulmonary/Chest: Effort normal and breath sounds normal. No respiratory  distress. She has no wheezes. She has no rales.  Musculoskeletal:       Right knee reveals no effusion. No warmth. Full range of motion. No medial or lateral jointline tenderness. No patellar tenderness. Very faint ecchymosis along the anterior lateral aspect of the knee. No anterior tenderness. No popliteal swelling but does have some nonspecific mild  tenderness popliteal region. No posterior knee ecchymosis. Collateral ligament testing is normal. Anterior drawer test is negative. No evidence for posterior instability  Lymphadenopathy:    She has no cervical adenopathy.          Assessment & Plan:  #1 right knee pain. Following fall. Mostly posterior knee pain. Could be meniscal related. Doubt posterior cruciate tear. Start with plain x-rays. If normal and no improvement in the next 2-3 weeks consider MRI scan to further assess. Her symptoms have improved slightly #2 bilateral otalgia. Normal exam at this time. Patient requesting ENT referral. She'll call back with her preference. She also describes some occasional loss of voice with singing.

## 2011-11-21 ENCOUNTER — Ambulatory Visit (INDEPENDENT_AMBULATORY_CARE_PROVIDER_SITE_OTHER)
Admission: RE | Admit: 2011-11-21 | Discharge: 2011-11-21 | Disposition: A | Discharge status: Home / Self Care | Payer: 59 | Source: Ambulatory Visit | Attending: Family Medicine | Admitting: Family Medicine

## 2011-11-21 DIAGNOSIS — M25561 Pain in right knee: Secondary | ICD-10-CM

## 2011-11-21 DIAGNOSIS — M25569 Pain in unspecified knee: Secondary | ICD-10-CM

## 2011-11-22 NOTE — Progress Notes (Signed)
Quick Note:  Pt informed on cell number ______

## 2011-12-01 ENCOUNTER — Telehealth: Payer: Self-pay | Admitting: Family Medicine

## 2011-12-01 DIAGNOSIS — H9209 Otalgia, unspecified ear: Secondary | ICD-10-CM

## 2011-12-01 NOTE — Telephone Encounter (Signed)
Pt needs referral to ENT [facility] [phone] . Pt stated reason for referral in her chart. Pt also still having leg pain please advise. Pt was seen  On 11-17-2011

## 2011-12-04 NOTE — Telephone Encounter (Signed)
OK to set up referral to [facility]NT.  Bilateral ear pain.  ?hx of effusion though I could not appreciate on last exam.

## 2011-12-08 NOTE — Addendum Note (Signed)
Addended by: Melchor Amour on: 12/08/2011 01:45 PM   Modules accepted: Orders

## 2011-12-08 NOTE — Telephone Encounter (Signed)
Pt informed, referral made

## 2011-12-08 NOTE — Telephone Encounter (Signed)
Pt called on 11/15 reporting ongoing right leg/knee pain and requesting ENT referral.  ENT referral was made, now pt is calling back again to ask what to do with her ongoing leg/knee pain following fall at restaurant.  Return OV?

## 2011-12-08 NOTE — Telephone Encounter (Signed)
I did call pt, wanted to gather more information, had to leave a message to call back.

## 2011-12-08 NOTE — Telephone Encounter (Signed)
Refer to [place] [facility] for knee pain.

## 2011-12-08 NOTE — Telephone Encounter (Signed)
Pt is still having leg pain please advise.

## 2011-12-18 ENCOUNTER — Encounter: Payer: 59 | Admitting: Obstetrics and Gynecology

## 2011-12-21 ENCOUNTER — Encounter: Payer: 59 | Admitting: Obstetrics and Gynecology

## 2012-01-02 ENCOUNTER — Encounter: Payer: Self-pay | Admitting: Obstetrics and Gynecology

## 2012-01-02 ENCOUNTER — Ambulatory Visit (INDEPENDENT_AMBULATORY_CARE_PROVIDER_SITE_OTHER): Payer: 59 | Admitting: Obstetrics and Gynecology

## 2012-01-02 VITALS — BP 110/70 | Resp 16 | Wt 237.0 lb

## 2012-01-02 DIAGNOSIS — D219 Benign neoplasm of connective and other soft tissue, unspecified: Secondary | ICD-10-CM

## 2012-01-02 DIAGNOSIS — D259 Leiomyoma of uterus, unspecified: Secondary | ICD-10-CM

## 2012-01-02 DIAGNOSIS — N926 Irregular menstruation, unspecified: Secondary | ICD-10-CM

## 2012-01-02 NOTE — Progress Notes (Signed)
HISTORY OF PRESENT ILLNESS  [name] is a 56 y.o. year old female,No obstetric history on file., who presents for a problem visit. The patient has a history of continued menstrual cycles.  In 2012 she had a dilatation and curettage with resection of a submucosal fibroid.  Pathology was benign. Hemoglobin is 13.3.  TSH is normal.  Subjective:  The patient complains of irregular bleeding.  Objective:  BP 110/70  Resp 16  Wt 237 lb (107.502 kg)  LMP 12/26/2011   General: no distress Resp: clear to auscultation bilaterally Cardio: regular rate and rhythm, S1, S2 normal, no murmur, click, rub or gallop GI: soft and nontender, fibroids are palpable in the pelvis.  External genitalia: normal general appearance Vaginal: normal without tenderness, induration or masses Cervix: normal appearance Adnexa: normal bimanual exam Uterus: 14 week size, firm, irregular  Assessment:  Irregular bleeding  Fibroid uterus  Plan:  Treatment options reviewed including hormonal therapy and surgical management including hysterectomy.  Risk and benefits reviewed.  The patient wants to observe only.  She does want to do an ultrasound to reevaluate her fibroids.  Gonorrhea and Chlamydia sent.  Return to office in 1 month(s).   Leonard Schwartz M.D.  01/02/2012 7:52 PM    When did bleeding start: couple months ago  How  Long: pt is currently having menses 2-3x's a month How often changing pad/tampon: flow is normal  Bleeding Disorders: no Cramping: yes Contraception: no Fibroids: yes Hormone Therapy: no New Medications: no Menopausal Symptoms: yes Vag. Discharge: no Abdominal Pain: no Increased Stress: no

## 2012-01-03 LAB — GC/CHLAMYDIA PROBE AMP
CT Probe RNA: NEGATIVE
GC Probe RNA: NEGATIVE

## 2012-01-05 ENCOUNTER — Other Ambulatory Visit: Payer: Self-pay

## 2012-01-05 ENCOUNTER — Telehealth: Payer: Self-pay

## 2012-01-05 DIAGNOSIS — D219 Benign neoplasm of connective and other soft tissue, unspecified: Secondary | ICD-10-CM

## 2012-01-05 NOTE — Telephone Encounter (Signed)
Pt was called to schedule U/S. Pt not ava Left message on Voicemail for pt to call back  [facility] CMA

## 2012-01-16 ENCOUNTER — Telehealth: Payer: Self-pay

## 2012-01-16 NOTE — Telephone Encounter (Signed)
Pt called again to schedule U/S. Pt stated she has to see when her other Doctor appts are and she will call back to schedule U/S and visit w/ AVS.   [facility] CMA

## 2012-01-31 ENCOUNTER — Other Ambulatory Visit: Payer: 59

## 2012-02-07 ENCOUNTER — Ambulatory Visit: Payer: 59 | Admitting: Family Medicine

## 2012-02-07 ENCOUNTER — Ambulatory Visit: Payer: Self-pay | Admitting: Family Medicine

## 2012-02-08 ENCOUNTER — Ambulatory Visit (INDEPENDENT_AMBULATORY_CARE_PROVIDER_SITE_OTHER): Payer: 59 | Admitting: Family Medicine

## 2012-02-08 ENCOUNTER — Encounter: Payer: Self-pay | Admitting: Family Medicine

## 2012-02-08 VITALS — BP 130/90 | Temp 98.4°F | Wt 230.0 lb

## 2012-02-08 DIAGNOSIS — R030 Elevated blood-pressure reading, without diagnosis of hypertension: Secondary | ICD-10-CM

## 2012-02-08 DIAGNOSIS — R7303 Prediabetes: Secondary | ICD-10-CM

## 2012-02-08 DIAGNOSIS — R7309 Other abnormal glucose: Secondary | ICD-10-CM

## 2012-02-08 NOTE — Patient Instructions (Addendum)
Continue with weight loss efforts.  

## 2012-02-08 NOTE — Progress Notes (Signed)
  Subjective:    Patient ID: Mary Romero, female    DOB: Mar 03, 1955, 57 y.o.   MRN: [id number]  HPI Patient has history of borderline elevated blood pressure. Not treated with medication. Blood pressures been mostly around 130/80 range. She plans to start weight loss program soon. Inconsistent exercise. No headaches. No dizziness. History prediabetes. No symptoms of hyperglycemia. Recent fasting blood sugar prediabetes range. She has home glucose monitor but does not use very often.   Review of Systems  Constitutional: Negative for fatigue.  Eyes: Negative for visual disturbance.  Respiratory: Negative for cough, chest tightness, shortness of breath and wheezing.   Cardiovascular: Negative for chest pain, palpitations and leg swelling.  Neurological: Negative for dizziness, seizures, syncope, weakness, light-headedness and headaches.       Objective:   Physical Exam  Constitutional: She appears well-developed and well-nourished.  Cardiovascular: Normal rate, regular rhythm and normal heart sounds.   Pulmonary/Chest: Effort normal and breath sounds normal. No respiratory distress. She has no wheezes. She has no rales.  Musculoskeletal: She exhibits no edema.          Assessment & Plan:  #1 history of elevated blood pressure. Repeat left arm seated 118/78. Continue weight loss efforts and establish more consistent aerobic exercise #2 history prediabetes. Stable by recent labs. Continue weight loss efforts and check fasting blood sugar at least every 6 months

## 2012-02-26 ENCOUNTER — Encounter: Payer: Self-pay | Admitting: Obstetrics and Gynecology

## 2012-02-26 ENCOUNTER — Ambulatory Visit: Payer: 59 | Admitting: Obstetrics and Gynecology

## 2012-02-26 ENCOUNTER — Ambulatory Visit: Payer: 59

## 2012-02-26 ENCOUNTER — Other Ambulatory Visit: Payer: Self-pay | Admitting: Obstetrics and Gynecology

## 2012-02-26 VITALS — BP 124/78 | Wt 219.0 lb

## 2012-02-26 DIAGNOSIS — D219 Benign neoplasm of connective and other soft tissue, unspecified: Secondary | ICD-10-CM

## 2012-02-26 DIAGNOSIS — D259 Leiomyoma of uterus, unspecified: Secondary | ICD-10-CM | POA: Insufficient documentation

## 2012-02-26 NOTE — Progress Notes (Signed)
HISTORY OF PRESENT ILLNESS  [name] is a 57 y.o. year old female,No obstetric history on file., who presents for a problem visit. The patient has a known history of fibroids.  She continues to have menstrual cycles.  She had a dilatation and curettage with benign pathology 2 years ago.  Subjective:  Irregular bleeding.  No pain.  Objective:  BP 124/78  Wt 219 lb (99.338 kg)  BMI 34.82 kg/m2  LMP 12/31/2011   General: no distress  Exam deferred.  Ultrasound: Uterus measures 14.17 x 12.1 cm.  10.7 cm fibroid noted.  Could not evaluate endometrial because of large fibroid and thin endometrium.  Assessment:  Irregular bleeding.  Fibroid uterus that is actually smaller than in 2011.  Plan:  Treatment options reviewed.  The patient elects to observe only.  Return to office in 3 month(s).   Leonard Schwartz M.D.  02/26/2012 4:50 PM

## 2012-03-29 ENCOUNTER — Ambulatory Visit (INDEPENDENT_AMBULATORY_CARE_PROVIDER_SITE_OTHER): Payer: 59 | Admitting: Family Medicine

## 2012-03-29 ENCOUNTER — Encounter: Payer: Self-pay | Admitting: Family Medicine

## 2012-03-29 VITALS — BP 130/86 | HR 72 | Temp 97.9°F | Wt 211.0 lb

## 2012-03-29 NOTE — Patient Instructions (Addendum)
We will set up MRI and call you when that has been scheduled.

## 2012-03-29 NOTE — Progress Notes (Signed)
  Subjective:    Patient ID: Mary Romero, female    DOB: 1955/03/09, 57 y.o.   MRN: [id number]  HPI Persistent right knee pains  Refer to prior notes. Patient was in a restaurant and tripped over it regular carpet 11/02/2011 Landed on both knees and presented here the next day with right posterior knee pain. She's had persistent pain since then. Location is lateral aspect right posterior knee. Symptoms are somewhat intermittent and worse with lateral leg movement and after prolonged periods of sitting Has tried ibuprofen without much relief. She went orthopedist several months ago it was time this represented likely hamstring strain Plain x-rays did not reveal any acute abnormality. She had some degenerative changes medial compartment  Denies any ecchymosis. No warmth. No erythema. No locking or giving way.    Review of Systems  Constitutional: Negative for fever and appetite change.  Musculoskeletal: Negative for back pain, joint swelling and gait problem.  Skin: Negative for rash.       Objective:   Physical Exam  Constitutional: She appears well-developed and well-nourished.  Cardiovascular: Normal rate and regular rhythm.   Pulmonary/Chest: Effort normal and breath sounds normal. No respiratory distress. She has no wheezes. She has no rales.  Musculoskeletal:  Right knee reveals full range of motion. No effusion. No warmth. No erythema. No medial or lateral jointline tenderness. Ligament testing normal. She has some soft tissue swelling posterior aspect right knee with question of Baker's cyst versus other soft tissue cystic swelling. This does correlate to area of tenderness          Assessment & Plan:  Persistent right posterior knee pain following accident 11/02/2011. She has some definite soft tissue swelling right posterior knee. Given duration of symptoms MRI to further assess. She has not had relief with conservative measures such as ibuprofen

## 2012-04-09 ENCOUNTER — Ambulatory Visit
Admission: RE | Admit: 2012-04-09 | Discharge: 2012-04-09 | Disposition: A | Discharge status: Home / Self Care | Payer: 59 | Source: Ambulatory Visit | Attending: Family Medicine | Admitting: Family Medicine

## 2012-04-09 DIAGNOSIS — M25561 Pain in right knee: Secondary | ICD-10-CM

## 2012-04-17 ENCOUNTER — Telehealth: Payer: Self-pay | Admitting: Family Medicine

## 2012-04-17 NOTE — Telephone Encounter (Signed)
I spoke with pt.  MRI showed mostly degenerative changes and ?loose bodies.  She is no better. She wants to see ortho in [place] and she is trying to set up.

## 2012-04-17 NOTE — Telephone Encounter (Signed)
Patient called stating that she would like a call back from the Physician regarding her lab results, NOT the nurse. Please advise.

## 2012-05-23 ENCOUNTER — Ambulatory Visit: Payer: 59 | Admitting: Family Medicine

## 2012-06-03 ENCOUNTER — Ambulatory Visit: Payer: 59 | Admitting: Family Medicine

## 2012-06-07 ENCOUNTER — Encounter: Payer: Self-pay | Admitting: Family Medicine

## 2012-06-07 ENCOUNTER — Ambulatory Visit (INDEPENDENT_AMBULATORY_CARE_PROVIDER_SITE_OTHER): Payer: 59 | Admitting: Family Medicine

## 2012-06-07 VITALS — BP 124/90 | Temp 98.3°F | Wt 204.0 lb

## 2012-06-07 DIAGNOSIS — K219 Gastro-esophageal reflux disease without esophagitis: Secondary | ICD-10-CM

## 2012-06-07 DIAGNOSIS — G47429 Narcolepsy in conditions classified elsewhere without cataplexy: Secondary | ICD-10-CM

## 2012-06-07 MED ORDER — METHYLPHENIDATE HCL 10 MG PO TABS: 10.0000 mg | ORAL_TABLET | Freq: Two times a day (BID) | ORAL | Status: DC

## 2012-06-07 NOTE — Progress Notes (Signed)
Subjective:    Patient ID: Mary Romero, female    DOB: August 15, 1955, 57 y.o.   MRN: [id number]  HPI Patient had some recent issues with dysphonia. She does a lot of singing. Recently had an evaluation at [facility]. She had nasal laryngoscopy and suspicion for laryngo-pharyngeal reflux She was started on regimen her protonix 40 mg twice daily.  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 She had evaluations in 2009 with sleep study which did not reveal any obstructive sleep apnea. Multiple sleep latency test August 2009 confirmed narcolepsy versus idiopathic hypersomnolence in the absence of inadequate sleep. She does states that she sleeps generally about 5-7 hours per night. She has a very busy schedule. She states in the past even when she has gotten more than 8 hours sleep at night she's had extreme daytime somnolence.  Because of her recent reflux issue she had to decrease caffeine. She has generally tried to self medicate with caffeine. Previous physician had her on Ritalin twice daily which she states worked very well. She's not been tried on Provigil.  She denied any history of thyroid issues. She is actually recently lost some weight due to her efforts. No history of any hypertension or cardiac issues.  Past Medical History  Diagnosis Date  . 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 TINNITUS 12/22/2008  . ALLERGIC RHINITIS 04/23/2009  . VITAMIN D DEFICIENCY 12/27/2009  . Depression   . Fibroid 2004  . BMI 37.0-37.9,adult 02/15/10  . Irregular menses 02/15/10  . H/O menorrhagia 2002  . ASCUS (atypical squamous cells of undetermined significance) on Pap smear 10/1999  . Perimenopause 09/2001  . Herpes genitalia 09/2001  . H/O dysmenorrhea 10/09/01  . ANEMIA-IRON DEFICIENCY 10/09/01  . H/O vaginal discharge 10/09/01    R/O std  . H/O vaginitis 06/17/02  . Dysuria 2004  . Fatigue   . H/O abdominal pain 03/2007  . PRB (rectal bleeding) 04/2007   . Post-menopausal bleeding 05/2007  . Irregular periods/menstrual cycles 6/09  . BV (bacterial vaginosis) 06/2007  . Hx: UTI (urinary tract infection) 11/2007  . H/O: obesity 12/20/09  . Endometrial polyp 12/23/09   Past Surgical History  Procedure Laterality Date  . Eye surgery  2006    X 2  . Dilation and curettage of uterus  02/15/10  . Hysteroscopy  02/15/10    reports that she quit smoking about 13 years ago. Her smoking use included Cigarettes. She has a 12.5 pack-year smoking history. She has never used smokeless tobacco. She reports that  drinks alcohol. She reports that she does not use illicit drugs. family history includes Alzheimer's disease in her mother; Cancer in her other; Diabetes in her mother; Heart disease in her father; and Hypertension in her father and mother. Allergies  Allergen Reactions  . Codeine Sulfate     REACTION: "out of body experience"  . Latex       Review of Systems  Constitutional: Positive for fatigue. Negative for fever and chills.  HENT: Positive for voice change. Negative for sore throat.   Eyes: Negative for visual disturbance.  Respiratory: Negative for cough, chest tightness, shortness of breath and wheezing.   Cardiovascular: Negative for chest pain, palpitations and leg swelling.  Gastrointestinal: Negative for abdominal pain.  Neurological: Negative for dizziness, seizures, syncope, weakness, light-headedness and headaches.       Objective:   Physical Exam  Constitutional: She appears well-developed and well-nourished.  HENT:  Mouth/Throat: Oropharynx is clear and  moist.  Right tonsil is larger than left but no mass-no abnormal features. No neck adenopathy.  Neck: Neck supple. No thyromegaly present.  Cardiovascular: Normal rate and regular rhythm.   Pulmonary/Chest: Effort normal and breath sounds normal. No respiratory distress. She has no wheezes. She has no rales.  Lymphadenopathy:    She has no cervical adenopathy.           Assessment & Plan:  #1 GERD currently treated with Protonix 40 mg twice daily. Discussed lifestyle management of GERD and especially caffeine reduction #2 questionable history of narcolepsy. She has long history of daytime somnolence. We discussed various medications. We agreed to trial of getting back on Ritalin 10 mg twice daily. We also discussed other possible options such as Provigil. She's had previous multiple sleep latency test as above. We have strongly emphasized importance of getting adequate sleep at night

## 2012-06-13 ENCOUNTER — Other Ambulatory Visit: Payer: Self-pay | Admitting: Obstetrics and Gynecology

## 2012-06-13 DIAGNOSIS — Z1231 Encounter for screening mammogram for malignant neoplasm of breast: Secondary | ICD-10-CM

## 2012-06-20 ENCOUNTER — Ambulatory Visit (HOSPITAL_COMMUNITY): Payer: 59

## 2012-06-25 ENCOUNTER — Ambulatory Visit (HOSPITAL_COMMUNITY): Payer: 59 | Attending: Obstetrics and Gynecology

## 2012-06-27 ENCOUNTER — Telehealth: Payer: Self-pay | Admitting: *Deleted

## 2012-06-27 NOTE — Telephone Encounter (Signed)
Prior auth request was received for the Ritalin.  I left our pt a VM to inform her as she has not ever taken the med yet.  I reminded her Dr Caryl Never would like to see her one month after she begins the new med.

## 2012-06-28 ENCOUNTER — Other Ambulatory Visit: Payer: Self-pay | Admitting: Obstetrics and Gynecology

## 2012-06-28 DIAGNOSIS — Z1231 Encounter for screening mammogram for malignant neoplasm of breast: Secondary | ICD-10-CM

## 2012-07-08 NOTE — Telephone Encounter (Signed)
Prior auth approved

## 2012-07-16 ENCOUNTER — Ambulatory Visit (HOSPITAL_COMMUNITY): Admission: RE | Admit: 2012-07-16 | Payer: 59 | Source: Ambulatory Visit

## 2012-09-10 ENCOUNTER — Ambulatory Visit (HOSPITAL_COMMUNITY)
Admission: RE | Admit: 2012-09-10 | Discharge: 2012-09-10 | Disposition: A | Discharge status: Home / Self Care | Payer: 59 | Source: Ambulatory Visit | Attending: Obstetrics and Gynecology | Admitting: Obstetrics and Gynecology

## 2012-09-10 DIAGNOSIS — Z1231 Encounter for screening mammogram for malignant neoplasm of breast: Secondary | ICD-10-CM | POA: Insufficient documentation

## 2012-10-04 ENCOUNTER — Encounter: Payer: Self-pay | Admitting: Family Medicine

## 2012-10-04 ENCOUNTER — Ambulatory Visit (INDEPENDENT_AMBULATORY_CARE_PROVIDER_SITE_OTHER): Payer: 59 | Admitting: Family Medicine

## 2012-10-04 VITALS — BP 134/72 | HR 72 | Temp 98.4°F | Wt 205.0 lb

## 2012-10-04 DIAGNOSIS — Z23 Encounter for immunization: Secondary | ICD-10-CM

## 2012-10-04 DIAGNOSIS — G47419 Narcolepsy without cataplexy: Secondary | ICD-10-CM

## 2012-10-04 MED ORDER — METHYLPHENIDATE HCL 10 MG PO TABS: ORAL_TABLET | ORAL | Status: DC

## 2012-10-04 NOTE — Progress Notes (Signed)
  Subjective:    Patient ID: Mary Romero, female    DOB: 01/06/1956, 57 y.o.   MRN: [id number]  HPI  Patient seen for followup regarding possible narcolepsy. She has seen good response with Ritalin. Refer to prior note. She has frequent daytime somnolence with no evidence for obstructive sleep apnea. Patient was concerned about possible narcolepsy but not clear this is been verified. She has not had any side effects . Initially she saw a good response but would like to consider titration. Previously, on dosage of 15 mg twice daily and that seemed to be the best dose for her. No headaches or other side effects  Past Medical History  Diagnosis Date  . 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 TINNITUS 12/22/2008  . ALLERGIC RHINITIS 04/23/2009  . VITAMIN D DEFICIENCY 12/27/2009  . Depression   . Fibroid 2004  . BMI 37.0-37.9,adult 02/15/10  . Irregular menses 02/15/10  . H/O menorrhagia 2002  . ASCUS (atypical squamous cells of undetermined significance) on Pap smear 10/1999  . Perimenopause 09/2001  . Herpes genitalia 09/2001  . H/O dysmenorrhea 10/09/01  . ANEMIA-IRON DEFICIENCY 10/09/01  . H/O vaginal discharge 10/09/01    R/O std  . H/O vaginitis 06/17/02  . Dysuria 2004  . Fatigue   . H/O abdominal pain 03/2007  . PRB (rectal bleeding) 04/2007  . Post-menopausal bleeding 05/2007  . Irregular periods/menstrual cycles 6/09  . BV (bacterial vaginosis) 06/2007  . Hx: UTI (urinary tract infection) 11/2007  . H/O: obesity 12/20/09  . Endometrial polyp 12/23/09   Past Surgical History  Procedure Laterality Date  . Eye surgery  2006    X 2  . Dilation and curettage of uterus  02/15/10  . Hysteroscopy  02/15/10    reports that she quit smoking about 13 years ago. Her smoking use included Cigarettes. She has a 12.5 pack-year smoking history. She has never used smokeless tobacco. She reports that  drinks alcohol. She reports that she does not use illicit drugs. family history  includes Alzheimer's disease in her mother; Cancer in her other; Diabetes in her mother; Heart disease in her father; Hypertension in her father and mother. Allergies  Allergen Reactions  . Codeine Sulfate     REACTION: "out of body experience"  . Latex      Review of Systems  Constitutional: Negative for appetite change and unexpected weight change.  Cardiovascular: Negative for chest pain.  Neurological: Negative for headaches.       Objective:   Physical Exam  Constitutional: She appears well-developed and well-nourished. No distress.  Neck: Neck supple. No thyromegaly present.  Cardiovascular: Normal rate and regular rhythm.   Pulmonary/Chest: Effort normal and breath sounds normal. No respiratory distress. She has no wheezes. She has no rales.  Musculoskeletal: She exhibits no edema.          Assessment & Plan:  Possible narcolepsy. She has responded to low-dose Ritalin. We'll titrate to 15 mg twice daily.  flu vaccine given

## 2012-11-20 ENCOUNTER — Encounter: Payer: 59 | Admitting: Family Medicine

## 2012-11-26 ENCOUNTER — Telehealth: Payer: Self-pay | Admitting: Family Medicine

## 2012-11-26 NOTE — Telephone Encounter (Signed)
Pt would like to go back to methylphenidate 15 mg twice a day. #60.

## 2012-11-27 MED ORDER — METHYLPHENIDATE HCL 10 MG PO TABS: ORAL_TABLET | ORAL | Status: DC

## 2012-11-27 NOTE — Telephone Encounter (Signed)
Left message for patient to return call.

## 2012-11-27 NOTE — Telephone Encounter (Signed)
OK to switch back to 15 mg bid and refill.

## 2012-11-27 NOTE — Telephone Encounter (Signed)
Pt aware that RX is ready for pick up  

## 2012-11-27 NOTE — Telephone Encounter (Signed)
Patient currently on methylphenidate 10 mg  Taking one and 1/2 tablet twice daily. Last refill 10/04/12 #90 0 refill on current medication

## 2013-01-02 ENCOUNTER — Other Ambulatory Visit: Payer: 59

## 2013-01-10 ENCOUNTER — Encounter: Payer: 59 | Admitting: Family Medicine

## 2013-01-22 ENCOUNTER — Telehealth: Payer: Self-pay | Admitting: Family Medicine

## 2013-01-22 MED ORDER — METHYLPHENIDATE HCL 10 MG PO TABS: ORAL_TABLET | ORAL | Status: DC

## 2013-01-22 NOTE — Telephone Encounter (Signed)
Last visit 10-04-12 Last refill 11-27-12 #90 0

## 2013-01-22 NOTE — Telephone Encounter (Signed)
Left message for patient. RX is ready for pick up

## 2013-01-22 NOTE — Telephone Encounter (Signed)
Pt request 3 mo of methylphenidate (RITALIN) 10 MG tablet  1.5 /bid

## 2013-01-22 NOTE — Telephone Encounter (Signed)
Refill for 3 months. 

## 2013-01-23 ENCOUNTER — Other Ambulatory Visit: Payer: 59

## 2013-01-30 ENCOUNTER — Encounter: Payer: 59 | Admitting: Family Medicine

## 2013-02-04 ENCOUNTER — Other Ambulatory Visit: Payer: 59

## 2013-02-12 ENCOUNTER — Encounter: Payer: 59 | Admitting: Family Medicine

## 2013-03-12 ENCOUNTER — Encounter: Payer: Self-pay | Admitting: Family Medicine

## 2013-03-12 ENCOUNTER — Ambulatory Visit (INDEPENDENT_AMBULATORY_CARE_PROVIDER_SITE_OTHER): Payer: 59 | Admitting: Family Medicine

## 2013-03-12 VITALS — BP 112/84 | Temp 98.9°F | Wt 220.0 lb

## 2013-03-12 DIAGNOSIS — J069 Acute upper respiratory infection, unspecified: Secondary | ICD-10-CM

## 2013-03-12 DIAGNOSIS — B9789 Other viral agents as the cause of diseases classified elsewhere: Principal | ICD-10-CM

## 2013-03-12 NOTE — Progress Notes (Signed)
Pre visit review using our clinic review tool, if applicable. No additional management support is needed unless otherwise documented below in the visit note. 

## 2013-03-12 NOTE — Progress Notes (Signed)
   Subjective:    Patient ID: Mary Romero, female    DOB: Mar 30, 1955, 58 y.o.   MRN: [id number]  Cough Associated symptoms include chills and a sore throat. Pertinent negatives include no fever.   Acute visit Patient seen with onset last week ago chills, mostly nonproductive cough, malaise, myalgias, nasal congestion. She initially had sore throat which is now resolved. Her cough has been relatively mild and improved with over-the-counter medications. Denies any nausea or vomiting.  Past Medical History  Diagnosis Date  . 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 TINNITUS 12/22/2008  . ALLERGIC RHINITIS 04/23/2009  . VITAMIN D DEFICIENCY 12/27/2009  . Depression   . Fibroid 2004  . BMI 37.0-37.9,adult 02/15/10  . Irregular menses 02/15/10  . H/O menorrhagia 2002  . ASCUS (atypical squamous cells of undetermined significance) on Pap smear 10/1999  . Perimenopause 09/2001  . Herpes genitalia 09/2001  . H/O dysmenorrhea 10/09/01  . ANEMIA-IRON DEFICIENCY 10/09/01  . H/O vaginal discharge 10/09/01    R/O std  . H/O vaginitis 06/17/02  . Dysuria 2004  . Fatigue   . H/O abdominal pain 03/2007  . PRB (rectal bleeding) 04/2007  . Post-menopausal bleeding 05/2007  . Irregular periods/menstrual cycles 6/09  . BV (bacterial vaginosis) 06/2007  . Hx: UTI (urinary tract infection) 11/2007  . H/O: obesity 12/20/09  . Endometrial polyp 12/23/09   Past Surgical History  Procedure Laterality Date  . Eye surgery  2006    X 2  . Dilation and curettage of uterus  02/15/10  . Hysteroscopy  02/15/10    reports that she quit smoking about 13 years ago. Her smoking use included Cigarettes. She has a 12.5 pack-year smoking history. She has never used smokeless tobacco. She reports that she drinks alcohol. She reports that she does not use illicit drugs. family history includes Alzheimer's disease in her mother; Cancer in her other; Diabetes in her mother; Heart disease in her father;  Hypertension in her father and mother. Allergies  Allergen Reactions  . Codeine Sulfate     REACTION: "out of body experience"  . Latex       Review of Systems  Constitutional: Positive for chills and fatigue. Negative for fever.  HENT: Positive for congestion and sore throat.   Respiratory: Positive for cough.        Objective:   Physical Exam  Constitutional: She appears well-developed and well-nourished. No distress.  HENT:  Right Ear: External ear normal.  Left Ear: External ear normal.  Mouth/Throat: Oropharynx is clear and moist.  Neck: Neck supple.  Cardiovascular: Normal rate and regular rhythm.   Pulmonary/Chest: Effort normal and breath sounds normal. No respiratory distress. She has no wheezes. She has no rales.          Assessment & Plan:  Viral URI. Reassurance. Continue over-the-counter medications as needed

## 2013-03-12 NOTE — Patient Instructions (Signed)

## 2013-03-27 ENCOUNTER — Telehealth: Payer: Self-pay | Admitting: Family Medicine

## 2013-03-27 NOTE — Telephone Encounter (Signed)
Patient Information:  Caller Name: Eleni  Phone: [phone]  Patient: Mary Romero  Gender: Female  DOB: [birth year]  Age: 58 Years  PCP: Carolann Littler (Family Practice)  Office Follow Up:  Does the office need to follow up with this patient?: No  Instructions For The Office: N/A   Symptoms  Reason For Call & Symptoms: Onset 4-5 days ago of pt stepped on glass, small sliver.  Glass in right foot on lateral/inferior side, just posterior to small toe.  C/o minor intermittent pain.  Reviewed Health History In EMR: Yes  Reviewed Medications In EMR: Yes  Reviewed Allergies In EMR: Yes  Reviewed Surgeries / Procedures: Yes  Date of Onset of Symptoms: 03/23/2013  Treatments Tried: Soaked in Epsom salts and abx ointment  Treatments Tried Worked: No  Guideline(s) Used:  Agricultural consultant  Disposition Per Guideline:   Go to Office Now  Reason For Disposition Reached:   FB is clear (glass or plastic)  Advice Given:  Antibiotic Ointment:   Apply an antibiotic ointment (OTC) to the area once after removal to reduce the risk of infection.  Call Back If:  Can't get it all out  Removed it, but pain becomes worse  Starts to look infected  You become worse.  Patient Will Follow Care Advice:  YES  Appointment Scheduled:  03/28/2013 15:30:00 Appointment Scheduled Provider:  Carolann Littler [facility])

## 2013-03-27 NOTE — Telephone Encounter (Signed)
Noted  

## 2013-03-28 ENCOUNTER — Encounter: Payer: Self-pay | Admitting: Family Medicine

## 2013-03-28 ENCOUNTER — Ambulatory Visit (INDEPENDENT_AMBULATORY_CARE_PROVIDER_SITE_OTHER): Payer: 59 | Admitting: Family Medicine

## 2013-03-28 VITALS — BP 130/80 | HR 67 | Wt 217.0 lb

## 2013-03-28 DIAGNOSIS — S90851A Superficial foreign body, right foot, initial encounter: Secondary | ICD-10-CM

## 2013-03-28 DIAGNOSIS — IMO0002 Reserved for concepts with insufficient information to code with codable children: Secondary | ICD-10-CM

## 2013-03-28 NOTE — Progress Notes (Signed)
Pre visit review using our clinic review tool, if applicable. No additional management support is needed unless otherwise documented below in the visit note. 

## 2013-03-28 NOTE — Progress Notes (Signed)
   Subjective:    Patient ID: Mary Romero, female    DOB: 24-Apr-1955, 58 y.o.   MRN: [id number]  HPI Pain right foot. 4-5 days ago she was walking around her house after a glass had been broken. She thought she had all the glass up but stepped on a small piece. She had some bleeding. She was unable to remove any glass but felt as if she had something in her foot. Her husband tried on several attempts unsuccessfully. Last tetanus 2012   Review of Systems  Constitutional: Negative for fever and chills.       Objective:   Physical Exam  Constitutional: She appears well-developed and well-nourished.  Cardiovascular: Normal rate and regular rhythm.   Pulmonary/Chest: Effort normal and breath sounds normal.  Skin:  Along the lateral portion of the right foot anteriorly she has small area of perforation from recent injury. With magnification we were able to identify a small piece of glass. We're able to grasp this with hemostats and removed approximately 4-5 mm sliver of glass. No additional foreign body was noted. No surrounding erythema. No warmth          Assessment & Plan:  Foreign body with glass right foot. Removed as above. No incision required for removal.  No secondary infection.  Saltwater soaks. Follow up as needed

## 2013-03-28 NOTE — Patient Instructions (Signed)
Soak foot in warm salt water for next couple of days Follow up for any redness, fever, or increased drainage.

## 2013-05-31 IMAGING — CR DG KNEE COMPLETE 4+V*R*
4 series · 4 of 4 positions shown · non-contrast
Comparison: None.

CLINICAL DATA: History of injury from fall 2 weeks previously.
Persistent pain.  Posterior pain.

RIGHT KNEE - COMPLETE 4+ VIEW

[view not recorded (1 of 4)]
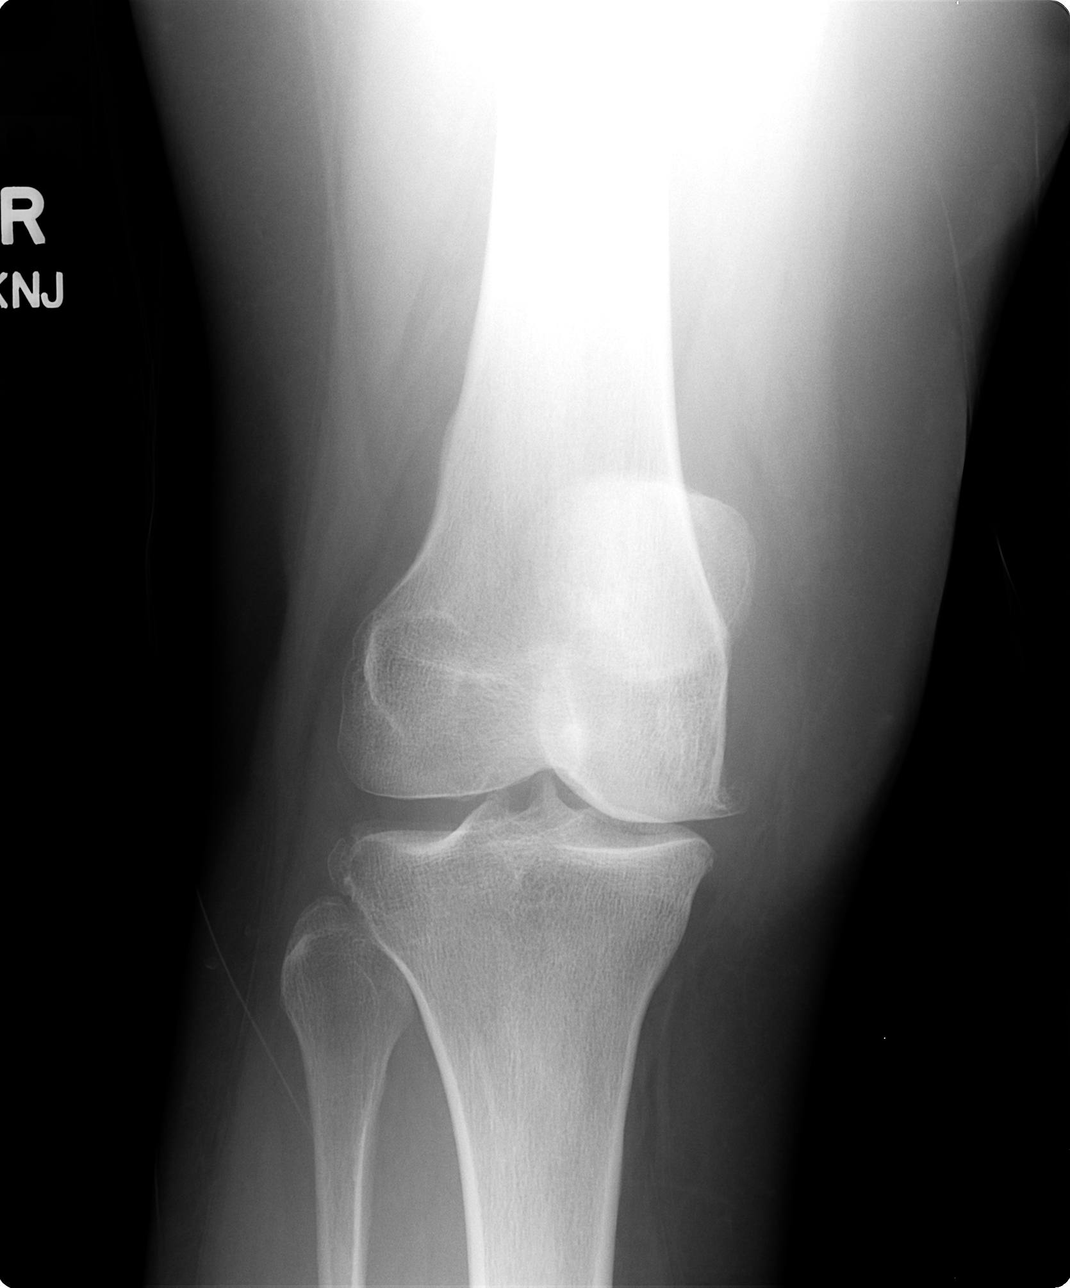

[view not recorded (2 of 4)]
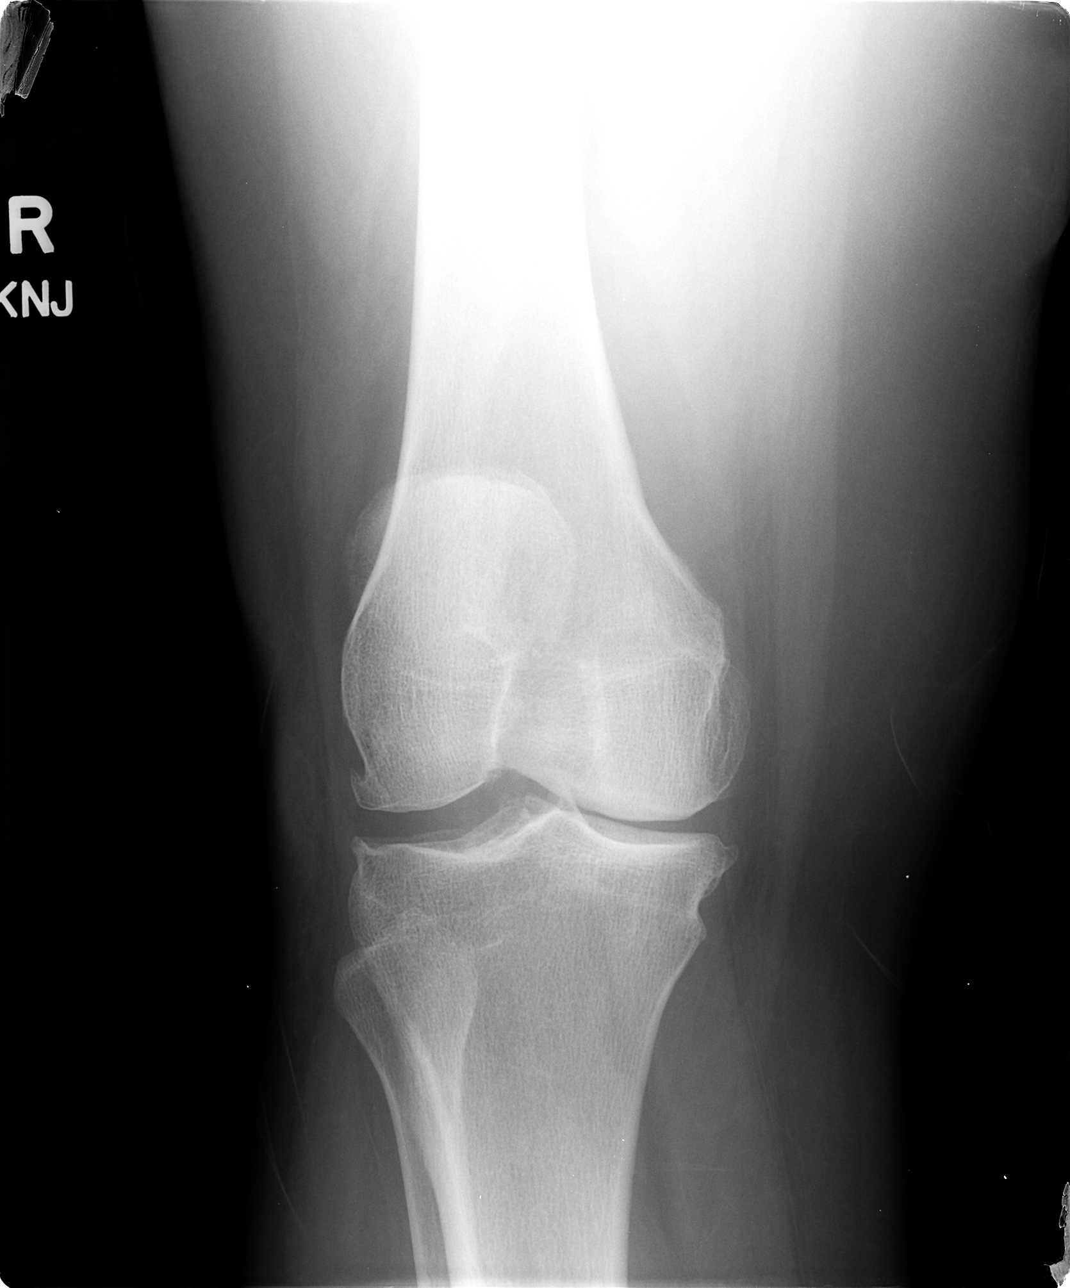

[view not recorded (3 of 4)]
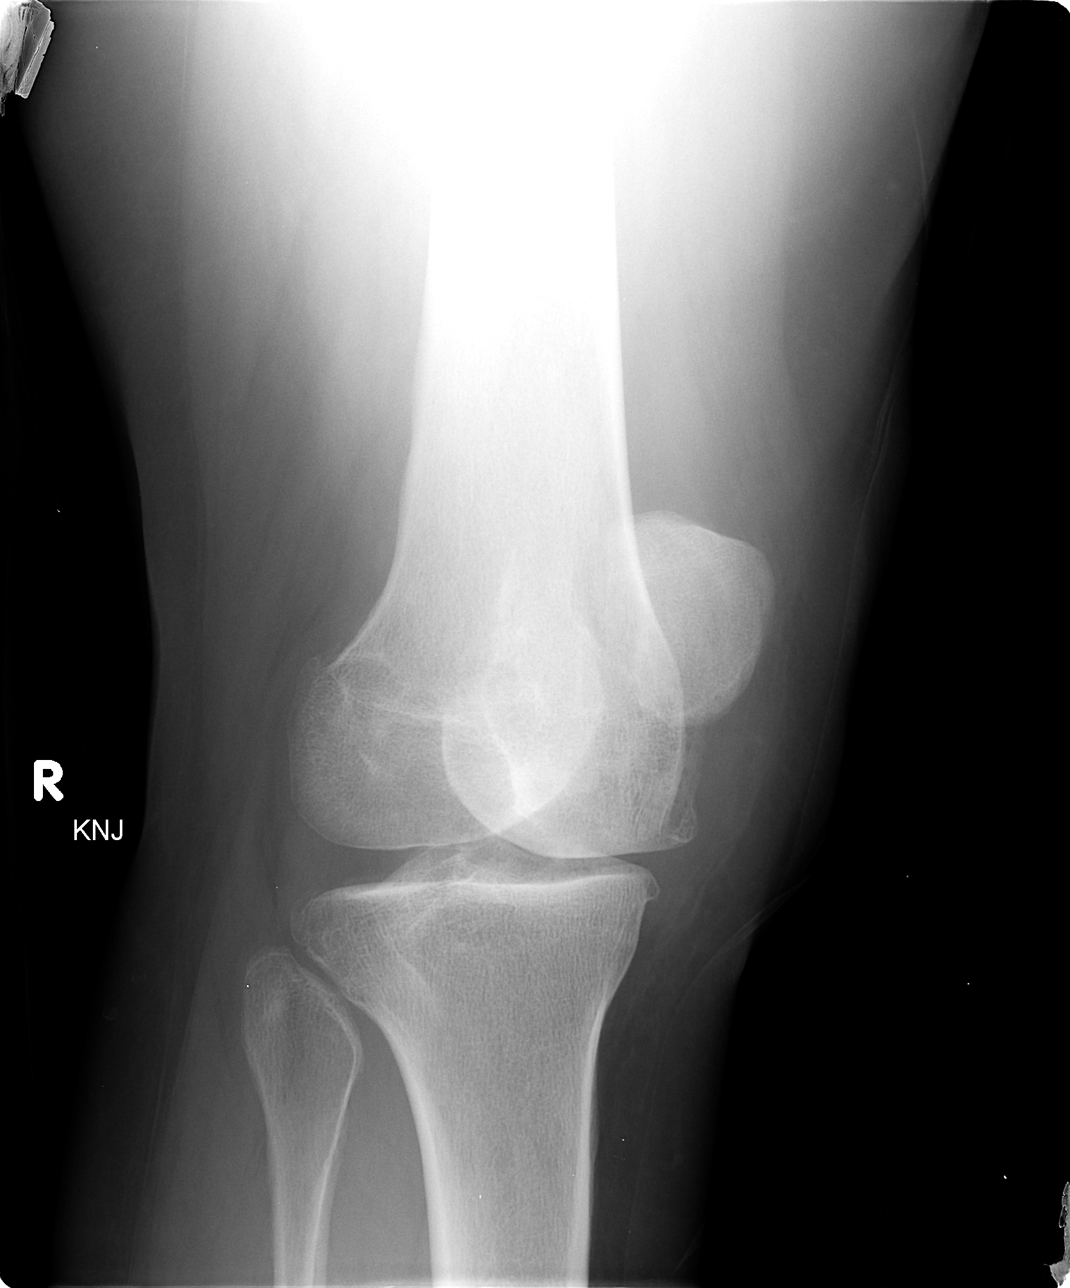

[view not recorded (4 of 4)]
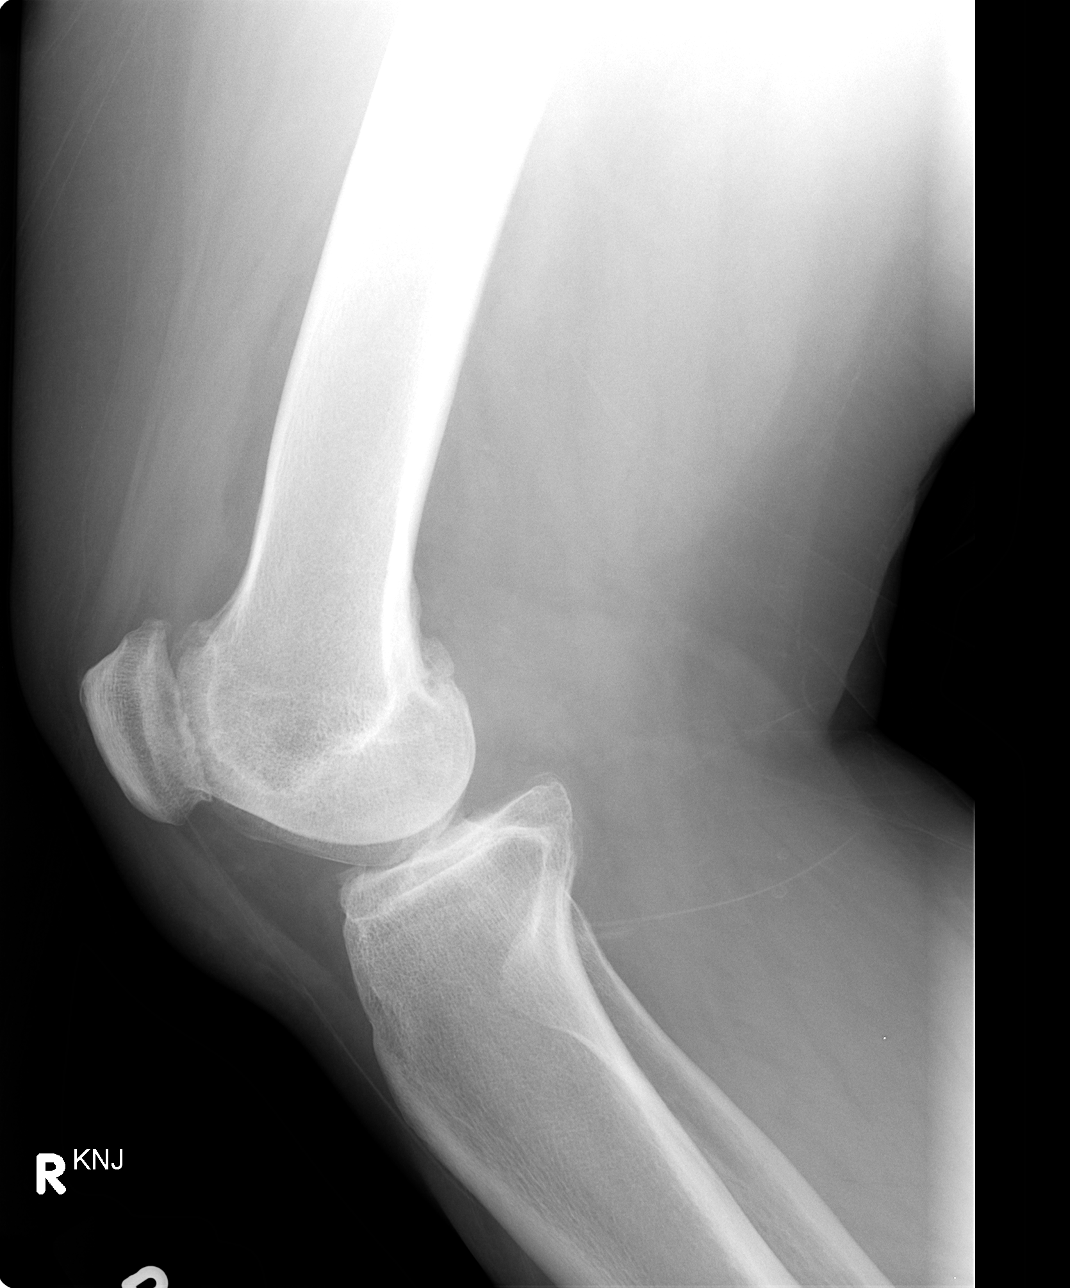

[4 of 4 positions shown; findings below may reference images not displayed]

FINDINGS: On the lateral image there is some hazy opacity in the
suprapatellar region which could reflect an element of joint
effusion.  There is narrowing of the patellofemoral joint space
with marginal osteophyte formation.  There is slight narrowing of
the medial joint space compared to the lateral joint space.  Medial
and lateral marginal osteophyte formation is present.  No fracture,
dislocation, bony destruction, chondrocalcinosis, or opaque loose
body is seen.
IMPRESSION: Narrowing of patellofemoral and medial joint spaces.  Marginal
osteophyte formation representing degenerative joint osteoarthritic
changes.  No fracture or dislocation. Question small joint
effusion..

## 2013-06-30 ENCOUNTER — Ambulatory Visit (INDEPENDENT_AMBULATORY_CARE_PROVIDER_SITE_OTHER): Payer: 59 | Admitting: Family Medicine

## 2013-06-30 ENCOUNTER — Encounter: Payer: Self-pay | Admitting: Family Medicine

## 2013-06-30 VITALS — BP 130/80 | HR 70 | Temp 98.5°F | Wt 205.0 lb

## 2013-06-30 DIAGNOSIS — L03032 Cellulitis of left toe: Secondary | ICD-10-CM

## 2013-06-30 DIAGNOSIS — S90112A Contusion of left great toe without damage to nail, initial encounter: Secondary | ICD-10-CM

## 2013-06-30 DIAGNOSIS — L02619 Cutaneous abscess of unspecified foot: Secondary | ICD-10-CM

## 2013-06-30 DIAGNOSIS — S90129A Contusion of unspecified lesser toe(s) without damage to nail, initial encounter: Secondary | ICD-10-CM

## 2013-06-30 DIAGNOSIS — L03039 Cellulitis of unspecified toe: Secondary | ICD-10-CM

## 2013-06-30 MED ORDER — CEPHALEXIN 500 MG PO CAPS: 500.0000 mg | ORAL_CAPSULE | Freq: Three times a day (TID) | ORAL | Status: DC

## 2013-06-30 NOTE — Progress Notes (Signed)
Pre visit review using our clinic review tool, if applicable. No additional management support is needed unless otherwise documented below in the visit note. 

## 2013-06-30 NOTE — Progress Notes (Signed)
   Subjective:    Patient ID: Mary Romero, female    DOB: [birth year], 58 y.o.   MRN: [id number]  Toe Pain    Plate fell on her left great toe. This occurred last Friday. She had some redness and swelling afterwards. She is able to ambulate without much pain. No fevers or chills. She notes some increased erythema today. Her nails are polished so she did not notice any nail color changes. No fever or chills  Past Medical History  Diagnosis Date  . 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 TINNITUS 12/22/2008  . ALLERGIC RHINITIS 04/23/2009  . VITAMIN D DEFICIENCY 12/27/2009  . Depression   . Fibroid 2004  . BMI 37.0-37.9,adult 02/15/10  . Irregular menses 02/15/10  . H/O menorrhagia 2002  . ASCUS (atypical squamous cells of undetermined significance) on Pap smear 10/1999  . Perimenopause 09/2001  . Herpes genitalia 09/2001  . H/O dysmenorrhea 10/09/01  . ANEMIA-IRON DEFICIENCY 10/09/01  . H/O vaginal discharge 10/09/01    R/O std  . H/O vaginitis 06/17/02  . Dysuria 2004  . Fatigue   . H/O abdominal pain 03/2007  . PRB (rectal bleeding) 04/2007  . Post-menopausal bleeding 05/2007  . Irregular periods/menstrual cycles 6/09  . BV (bacterial vaginosis) 06/2007  . Hx: UTI (urinary tract infection) 11/2007  . H/O: obesity 12/20/09  . Endometrial polyp 12/23/09   Past Surgical History  Procedure Laterality Date  . Eye surgery  2006    X 2  . Dilation and curettage of uterus  02/15/10  . Hysteroscopy  02/15/10    reports that she quit smoking about 14 years ago. Her smoking use included Cigarettes. She has a 12.5 pack-year smoking history. She has never used smokeless tobacco. She reports that she drinks alcohol. She reports that she does not use illicit drugs. family history includes Alzheimer's disease in her mother; Cancer in her other; Diabetes in her mother; Heart disease in her father; Hypertension in her father and mother. Allergies  Allergen Reactions  . Codeine  Sulfate     REACTION: "out of body experience"  . Latex       Review of Systems  Constitutional: Negative for fever and chills.       Objective:   Physical Exam  Constitutional: She appears well-developed and well-nourished.  Cardiovascular: Normal rate.   Musculoskeletal:  Left great toe reveals some erythema and swelling due to region of the nail matrix. We removed her nail polish and she does not have any abnormal color changes of the toenail itself. No visible breaks in skin. No warmth. Mildly tender.          Assessment & Plan:  Acute injury left great toe. Probably small subungual hematoma though nail itself is normal color. She has some swelling mostly along the region nail matrix with mild erythema. Cover for possible early cellulitis with Keflex 500 mg 3 times a day for 7 days. She is aware she may lose her toenail secondary to trauma. Followup within the next week of erythema not resolving and sooner as needed

## 2013-07-22 ENCOUNTER — Other Ambulatory Visit: Payer: 59

## 2013-07-28 ENCOUNTER — Encounter: Payer: 59 | Admitting: Family Medicine

## 2013-08-05 ENCOUNTER — Encounter: Payer: 59 | Admitting: Family Medicine

## 2013-08-12 ENCOUNTER — Other Ambulatory Visit: Payer: 59

## 2013-08-25 ENCOUNTER — Encounter: Payer: 59 | Admitting: Family Medicine

## 2013-09-10 ENCOUNTER — Other Ambulatory Visit: Payer: 59

## 2013-09-24 ENCOUNTER — Encounter: Payer: 59 | Admitting: Family Medicine

## 2013-10-03 ENCOUNTER — Other Ambulatory Visit: Payer: 59

## 2013-10-08 ENCOUNTER — Other Ambulatory Visit: Payer: 59

## 2013-10-17 ENCOUNTER — Encounter: Payer: 59 | Admitting: Family Medicine

## 2014-01-01 ENCOUNTER — Other Ambulatory Visit: Payer: 59

## 2014-01-06 ENCOUNTER — Encounter: Payer: 59 | Admitting: Family Medicine

## 2014-01-28 ENCOUNTER — Encounter: Payer: 59 | Admitting: Family Medicine

## 2014-03-04 ENCOUNTER — Other Ambulatory Visit (INDEPENDENT_AMBULATORY_CARE_PROVIDER_SITE_OTHER): Payer: 59

## 2014-03-04 DIAGNOSIS — Z0000 Encounter for general adult medical examination without abnormal findings: Secondary | ICD-10-CM

## 2014-03-04 LAB — HEPATIC FUNCTION PANEL
ALBUMIN: 3.9 g/dL (ref 3.5–5.2)
ALT: 14 U/L (ref 0–35)
AST: 18 U/L (ref 0–37)
Alkaline Phosphatase: 75 U/L (ref 39–117)
BILIRUBIN DIRECT: 0.1 mg/dL (ref 0.0–0.3)
TOTAL PROTEIN: 7.9 g/dL (ref 6.0–8.3)
Total Bilirubin: 0.4 mg/dL (ref 0.2–1.2)

## 2014-03-04 LAB — BASIC METABOLIC PANEL
BUN: 13 mg/dL (ref 6–23)
CALCIUM: 9.3 mg/dL (ref 8.4–10.5)
CO2: 27 mEq/L (ref 19–32)
Chloride: 106 mEq/L (ref 96–112)
Creatinine, Ser: 0.67 mg/dL (ref 0.40–1.20)
GFR: 115.74 mL/min (ref >60.00)
Glucose, Bld: 119 mg/dL — ABNORMAL HIGH (ref 70–99)
POTASSIUM: 4.1 meq/L (ref 3.5–5.1)
SODIUM: 139 meq/L (ref 135–145)

## 2014-03-04 LAB — CBC WITH DIFFERENTIAL/PLATELET
BASOS ABS: 0 10*3/uL (ref 0.0–0.1)
Basophils Relative: 0.8 % (ref 0.0–3.0)
EOS ABS: 0.1 10*3/uL (ref 0.0–0.7)
Eosinophils Relative: 2.7 % (ref 0.0–5.0)
HEMATOCRIT: 38.6 % (ref 36.0–46.0)
HEMOGLOBIN: 12.9 g/dL (ref 12.0–15.0)
LYMPHS ABS: 1.7 10*3/uL (ref 0.7–4.0)
Lymphocytes Relative: 37.7 % (ref 12.0–46.0)
MCHC: 33.4 g/dL (ref 30.0–36.0)
MCV: 86.7 fl (ref 78.0–100.0)
MONO ABS: 0.4 10*3/uL (ref 0.1–1.0)
MONOS PCT: 10 % (ref 3.0–12.0)
Neutro Abs: 2.2 10*3/uL (ref 1.4–7.7)
Neutrophils Relative %: 48.8 % (ref 43.0–77.0)
Platelets: 240 10*3/uL (ref 150.0–400.0)
RBC: 4.45 Mil/uL (ref 3.87–5.11)
RDW: 14 % (ref 11.5–15.5)
WBC: 4.4 10*3/uL (ref 4.0–10.5)

## 2014-03-04 LAB — LIPID PANEL
CHOLESTEROL: 148 mg/dL (ref 0–200)
HDL: 54 mg/dL (ref >39.00)
LDL CALC: 86 mg/dL (ref 0–99)
NONHDL: 94
Total CHOL/HDL Ratio: 3
Triglycerides: 40 mg/dL (ref 0.0–149.0)
VLDL: 8 mg/dL (ref 0.0–40.0)

## 2014-03-04 LAB — VITAMIN D 25 HYDROXY (VIT D DEFICIENCY, FRACTURES): VITD: 25 ng/mL — ABNORMAL LOW (ref 30.00–100.00)

## 2014-03-04 LAB — TSH: TSH: 3.09 u[IU]/mL (ref 0.35–4.50)

## 2014-03-16 ENCOUNTER — Encounter: Payer: Self-pay | Admitting: Family Medicine

## 2014-03-16 ENCOUNTER — Ambulatory Visit (INDEPENDENT_AMBULATORY_CARE_PROVIDER_SITE_OTHER): Payer: 59 | Admitting: Family Medicine

## 2014-03-16 VITALS — BP 130/80 | HR 72 | Temp 98.1°F | Ht 66.0 in | Wt 233.0 lb

## 2014-03-16 DIAGNOSIS — R319 Hematuria, unspecified: Secondary | ICD-10-CM

## 2014-03-16 DIAGNOSIS — Z0000 Encounter for general adult medical examination without abnormal findings: Secondary | ICD-10-CM

## 2014-03-16 DIAGNOSIS — E559 Vitamin D deficiency, unspecified: Secondary | ICD-10-CM

## 2014-03-16 DIAGNOSIS — R7309 Other abnormal glucose: Secondary | ICD-10-CM

## 2014-03-16 DIAGNOSIS — R7303 Prediabetes: Secondary | ICD-10-CM

## 2014-03-16 LAB — URINALYSIS, MICROSCOPIC ONLY

## 2014-03-16 LAB — POCT URINALYSIS DIPSTICK
Bilirubin, UA: NEGATIVE
Glucose, UA: NEGATIVE
Ketones, UA: NEGATIVE
LEUKOCYTES UA: NEGATIVE
NITRITE UA: NEGATIVE
PH UA: 7
Protein, UA: NEGATIVE
SPEC GRAV UA: 1.015
Urobilinogen, UA: 1

## 2014-03-16 MED ORDER — ERGOCALCIFEROL 1.25 MG (50000 UT) PO CAPS: 50000.0000 [IU] | ORAL_CAPSULE | ORAL | Status: DC

## 2014-03-16 NOTE — Progress Notes (Signed)
Pre visit review using our clinic review tool, if applicable. No additional management support is needed unless otherwise documented below in the visit note. 

## 2014-03-16 NOTE — Progress Notes (Signed)
Subjective:    Patient ID: Mary Romero, female    DOB: December 21, 1955, 59 y.o.   MRN: [id number]  HPI Patient seen for complete physical. She sees gynecologist yearly for Pap smears. She's gone to every other year mammograms. She has long history of uterine fibroids. She is still apparently having some menstrual periods and has had very close follow-up with GYN had recent ultrasound to evaluate. Her periods are starting to space out some. She has had some gradual weight gain over the past few years. No consistent exercise. She is currently taking eyedrops for glaucoma but no other medications. She quit smoking several years ago. She does take over-the-counter vitamin D supplement but is not sure how many units  Immunizations reviewed. She is not sure whether she had chickenpox as a child. Did not get flu shot this year and declines. Tetanus is up-to-date. Colonoscopy up-to-date.  Reviewed:  Past Medical History  Diagnosis Date  . 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 TINNITUS 12/22/2008  . ALLERGIC RHINITIS 04/23/2009  . VITAMIN D DEFICIENCY 12/27/2009  . Depression   . Fibroid 2004  . BMI 37.0-37.9,adult 02/15/10  . Irregular menses 02/15/10  . H/O menorrhagia 2002  . ASCUS (atypical squamous cells of undetermined significance) on Pap smear 10/1999  . Perimenopause 09/2001  . Herpes genitalia 09/2001  . H/O dysmenorrhea 10/09/01  . ANEMIA-IRON DEFICIENCY 10/09/01  . H/O vaginal discharge 10/09/01    R/O std  . H/O vaginitis 06/17/02  . Dysuria 2004  . Fatigue   . H/O abdominal pain 03/2007  . PRB (rectal bleeding) 04/2007  . Post-menopausal bleeding 05/2007  . Irregular periods/menstrual cycles 6/09  . BV (bacterial vaginosis) 06/2007  . Hx: UTI (urinary tract infection) 11/2007  . H/O: obesity 12/20/09  . Endometrial polyp 12/23/09   Past Surgical History  Procedure Laterality Date  . Eye surgery  2006    X 2  . Dilation and curettage of uterus  02/15/10  .  Hysteroscopy  02/15/10    reports that she quit smoking about 14 years ago. Her smoking use included Cigarettes. She has a 12.5 pack-year smoking history. She has never used smokeless tobacco. She reports that she drinks alcohol. She reports that she does not use illicit drugs. family history includes Alzheimer's disease in her mother; Cancer in her other; Diabetes in her mother; Heart disease in her father; Hypertension in her father and mother. Allergies  Allergen Reactions  . Codeine Sulfate     REACTION: "out of body experience"  . Latex       Review of Systems  Constitutional: Negative for fever, activity change, appetite change, fatigue and unexpected weight change.  HENT: Negative for ear pain, hearing loss, sore throat and trouble swallowing.   Eyes: Negative for visual disturbance.  Respiratory: Negative for cough and shortness of breath.   Cardiovascular: Negative for chest pain and palpitations.  Gastrointestinal: Negative for abdominal pain, diarrhea, constipation and blood in stool.  Endocrine: Negative for polydipsia and polyuria.  Genitourinary: Negative for dysuria and hematuria.  Musculoskeletal: Negative for myalgias, back pain and arthralgias.  Skin: Negative for rash.  Neurological: Negative for dizziness, syncope and headaches.  Hematological: Negative for adenopathy.  Psychiatric/Behavioral: Negative for confusion and dysphoric mood.       Objective:   Physical Exam  Constitutional: She is oriented to person, place, and time. She appears well-developed and well-nourished. No distress.  HENT:  Head: Normocephalic and atraumatic.  Right Ear: External  ear normal.  Left Ear: External ear normal.  Mouth/Throat: Oropharynx is clear and moist.  Neck: Neck supple. No thyromegaly present.  Cardiovascular: Normal rate and regular rhythm.   Pulmonary/Chest: Effort normal and breath sounds normal. No respiratory distress. She has no wheezes. She has no rales.    Abdominal: Soft. Bowel sounds are normal.  She has palpated uterine fibroid lower abdominal region which is mostly nontender  Musculoskeletal: She exhibits no edema.  Lymphadenopathy:    She has no cervical adenopathy.  Neurological: She is alert and oriented to person, place, and time.  Psychiatric: She has a normal mood and affect. Her behavior is normal.          Assessment & Plan:  Complete physical. Colonoscopy up-to-date. She is getting GYN follow-up regularly. We discussed flu vaccine and she declines. She is not sure regarding her previous chickenpox status. We offered varicella IgG which she'll consider next year. Labs reviewed. She has low vitamin D level. Vitamin D 50,000 international units once weekly and recheck 25-hydroxy vitamin D level in about 4 months. She has prediabetes. We strongly advocated more consistent exercise and weight loss. Check A1c at follow-up in 4 months  Urine dipstick revealed trace blood. Send urine micro-.

## 2014-06-23 ENCOUNTER — Telehealth: Payer: Self-pay

## 2014-06-23 NOTE — Telephone Encounter (Signed)
Left message to return call (concerning overdue mammogram)

## 2014-08-03 LAB — HM PAP SMEAR

## 2014-09-03 ENCOUNTER — Ambulatory Visit (INDEPENDENT_AMBULATORY_CARE_PROVIDER_SITE_OTHER): Payer: 59 | Admitting: Family Medicine

## 2014-09-03 ENCOUNTER — Encounter: Payer: Self-pay | Admitting: Family Medicine

## 2014-09-03 VITALS — BP 130/70 | HR 74 | Temp 98.4°F | Wt 233.0 lb

## 2014-09-03 DIAGNOSIS — S93509A Unspecified sprain of unspecified toe(s), initial encounter: Secondary | ICD-10-CM

## 2014-09-03 NOTE — Progress Notes (Signed)
Pre visit review using our clinic review tool, if applicable. No additional management support is needed unless otherwise documented below in the visit note. 

## 2014-09-03 NOTE — Progress Notes (Signed)
   Subjective:    Patient ID: Mary Romero, female    DOB: 22-Sep-1955, 59 y.o.   MRN: [id number]  HPI Acute visit for right fourth toe injury. Yesterday she accidentally "stumped" her toe.  She has had some mild swelling but no bruising. She actually states that her minimal pain today and she is ambulating without a difficulty. Denies any other injury. No foot pain.   Review of Systems  Neurological: Negative for weakness and numbness.       Objective:   Physical Exam  Constitutional: She appears well-developed and well-nourished. No distress.  Cardiovascular: Normal rate and regular rhythm.   Pulmonary/Chest: Effort normal and breath sounds normal. No respiratory distress. She has no wheezes. She has no rales.  Musculoskeletal:  Right foot examined. No ecchymosis. No warmth. No visible swelling. Right fourth toe is nontender to palpation. She is ambulating without difficulty          Assessment & Plan:  Sprain right fourth toe. Doubt fracture. We discussed possible x-ray but she is ambulating without difficulty and even if this were an uncomplicated fracture would not require intervention. She will wear comfortable shoe wear and follow-up as needed

## 2014-11-05 ENCOUNTER — Other Ambulatory Visit (INDEPENDENT_AMBULATORY_CARE_PROVIDER_SITE_OTHER): Payer: 59

## 2014-11-05 DIAGNOSIS — E559 Vitamin D deficiency, unspecified: Secondary | ICD-10-CM | POA: Diagnosis not present

## 2014-11-05 DIAGNOSIS — R7303 Prediabetes: Secondary | ICD-10-CM

## 2014-11-05 LAB — HEMOGLOBIN A1C: Hgb A1c MFr Bld: 6.5 % (ref 4.6–6.5)

## 2014-11-05 LAB — VITAMIN D 25 HYDROXY (VIT D DEFICIENCY, FRACTURES): VITD: 19.12 ng/mL — AB (ref 30.00–100.00)

## 2015-05-04 ENCOUNTER — Telehealth: Payer: Self-pay | Admitting: Family Medicine

## 2015-05-04 DIAGNOSIS — E559 Vitamin D deficiency, unspecified: Secondary | ICD-10-CM

## 2015-05-04 NOTE — Telephone Encounter (Signed)
Pt would like to add a vitamin d panel to her cpx labs. Is that OK?

## 2015-05-05 NOTE — Telephone Encounter (Signed)
Last checked on 11/05/14 with results of 19.12. Okay? Lab appt 06/05/15.

## 2015-05-05 NOTE — Telephone Encounter (Signed)
Order entered and pt is aware. 

## 2015-05-05 NOTE — Telephone Encounter (Signed)
OK to add

## 2015-05-18 ENCOUNTER — Ambulatory Visit (INDEPENDENT_AMBULATORY_CARE_PROVIDER_SITE_OTHER): Payer: BLUE CROSS/BLUE SHIELD | Admitting: Family Medicine

## 2015-05-18 VITALS — BP 150/100 | HR 69 | Temp 98.0°F | Ht 66.0 in | Wt 243.0 lb

## 2015-05-18 DIAGNOSIS — R030 Elevated blood-pressure reading, without diagnosis of hypertension: Secondary | ICD-10-CM | POA: Diagnosis not present

## 2015-05-18 DIAGNOSIS — Z0184 Encounter for antibody response examination: Secondary | ICD-10-CM

## 2015-05-18 DIAGNOSIS — IMO0001 Reserved for inherently not codable concepts without codable children: Secondary | ICD-10-CM

## 2015-05-18 NOTE — Progress Notes (Signed)
Subjective:    Patient ID: Mary Romero, female    DOB: [birth year], 60 y.o.   MRN: [id number]  HPI Seen for high blood pressure. Recently she got her blood pressure checked at a drugstore with reading of 170/94. No headaches. No chest pains. No dizziness. Has gained some weight over the past couple years. Occasional wine use but usually no more than 3-4 ounces a few times per week. Strong family history of hypertension. Rare nonsteroidal use. Never diagnosed or treated for hypertension.  Past Medical History  Diagnosis Date  . 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 TINNITUS 12/22/2008  . ALLERGIC RHINITIS 04/23/2009  . VITAMIN D DEFICIENCY 12/27/2009  . Depression   . Fibroid 2004  . BMI 37.0-37.9,adult 02/15/10  . Irregular menses 02/15/10  . H/O menorrhagia 2002  . ASCUS (atypical squamous cells of undetermined significance) on Pap smear 10/1999  . Perimenopause 09/2001  . Herpes genitalia 09/2001  . H/O dysmenorrhea 10/09/01  . ANEMIA-IRON DEFICIENCY 10/09/01  . H/O vaginal discharge 10/09/01    R/O std  . H/O vaginitis 06/17/02  . Dysuria 2004  . Fatigue   . H/O abdominal pain 03/2007  . PRB (rectal bleeding) 04/2007  . Post-menopausal bleeding 05/2007  . Irregular periods/menstrual cycles 6/09  . BV (bacterial vaginosis) 06/2007  . Hx: UTI (urinary tract infection) 11/2007  . H/O: obesity 12/20/09  . Endometrial polyp 12/23/09   Past Surgical History  Procedure Laterality Date  . Eye surgery  2006    X 2  . Dilation and curettage of uterus  02/15/10  . Hysteroscopy  02/15/10    reports that she quit smoking about 16 years ago. Her smoking use included Cigarettes. She has a 12.5 pack-year smoking history. She has never used smokeless tobacco. She reports that she drinks alcohol. She reports that she does not use illicit drugs. family history includes Alzheimer's disease in her mother; Cancer in her other; Diabetes in her mother; Heart disease in her  father; Hypertension in her father and mother. Allergies  Allergen Reactions  . Codeine Sulfate     REACTION: "out of body experience"  . Latex       Review of Systems  Constitutional: Negative for fatigue.  Eyes: Negative for visual disturbance.  Respiratory: Negative for cough, chest tightness, shortness of breath and wheezing.   Cardiovascular: Negative for chest pain, palpitations and leg swelling.  Neurological: Negative for dizziness, seizures, syncope, weakness, light-headedness and headaches.       Objective:   Physical Exam  Constitutional: She appears well-developed and well-nourished.  Eyes: Pupils are equal, round, and reactive to light.  Neck: Neck supple. No JVD present. No thyromegaly present.  Cardiovascular: Normal rate and regular rhythm.  Exam reveals no gallop.   Pulmonary/Chest: Effort normal and breath sounds normal. No respiratory distress. She has no wheezes. She has no rales.  Musculoskeletal: She exhibits no edema.  Neurological: She is alert.          Assessment & Plan:  Elevated blood pressure. Repeat left arm seated after rest 150/108. We had a discussion regarding whether to start medication versus reassess. Since she's not had previous elevations and she has physical scheduled 2 weeks we elected to wait and reassess at that point. If still up at that point will initiate medication. In the meantime we discussed DASH diet, continue aerobic exercise, follow sodium intake closely, avoid regular nonsteroidals.  Eulas Post MD [facility]

## 2015-05-18 NOTE — Patient Instructions (Signed)
Hypertension Hypertension, commonly called high blood pressure, is when the force of blood pumping through your arteries is too strong. Your arteries are the blood vessels that carry blood from your heart throughout your body. A blood pressure reading consists of a higher number over a lower number, such as 110/72. The higher number (systolic) is the pressure inside your arteries when your heart pumps. The lower number (diastolic) is the pressure inside your arteries when your heart relaxes. Ideally you want your blood pressure below 120/80. Hypertension forces your heart to work harder to pump blood. Your arteries may become narrow or stiff. Having untreated or uncontrolled hypertension can cause heart attack, stroke, kidney disease, and other problems. RISK FACTORS Some risk factors for high blood pressure are controllable. Others are not.  Risk factors you cannot control include:   Race. You may be at higher risk if you are African American.  Age. Risk increases with age.  Gender. Men are at higher risk than women before age 45 years. After age 65, women are at higher risk than men. Risk factors you can control include:  Not getting enough exercise or physical activity.  Being overweight.  Getting too much fat, sugar, calories, or salt in your diet.  Drinking too much alcohol. SIGNS AND SYMPTOMS Hypertension does not usually cause signs or symptoms. Extremely high blood pressure (hypertensive crisis) may cause headache, anxiety, shortness of breath, and nosebleed. DIAGNOSIS To check if you have hypertension, your health care provider will measure your blood pressure while you are seated, with your arm held at the level of your heart. It should be measured at least twice using the same arm. Certain conditions can cause a difference in blood pressure between your right and left arms. A blood pressure reading that is higher than normal on one occasion does not mean that you need treatment. If  it is not clear whether you have high blood pressure, you may be asked to return on a different day to have your blood pressure checked again. Or, you may be asked to monitor your blood pressure at home for 1 or more weeks. TREATMENT Treating high blood pressure includes making lifestyle changes and possibly taking medicine. Living a healthy lifestyle can help lower high blood pressure. You may need to change some of your habits. Lifestyle changes may include:  Following the DASH diet. This diet is high in fruits, vegetables, and whole grains. It is low in salt, red meat, and added sugars.  Keep your sodium intake below 2,300 mg per day.  Getting at least 30-45 minutes of aerobic exercise at least 4 times per week.  Losing weight if necessary.  Not smoking.  Limiting alcoholic beverages.  Learning ways to reduce stress. Your health care provider may prescribe medicine if lifestyle changes are not enough to get your blood pressure under control, and if one of the following is true:  You are 18-59 years of age and your systolic blood pressure is above 140.  You are 60 years of age or older, and your systolic blood pressure is above 150.  Your diastolic blood pressure is above 90.  You have diabetes, and your systolic blood pressure is over 140 or your diastolic blood pressure is over 90.  You have kidney disease and your blood pressure is above 140/90.  You have heart disease and your blood pressure is above 140/90. Your personal target blood pressure may vary depending on your medical conditions, your age, and other factors. HOME CARE INSTRUCTIONS    Have your blood pressure rechecked as directed by your health care provider.   Take medicines only as directed by your health care provider. Follow the directions carefully. Blood pressure medicines must be taken as prescribed. The medicine does not work as well when you skip doses. Skipping doses also puts you at risk for  problems.  Do not smoke.   Monitor your blood pressure at home as directed by your health care provider. SEEK MEDICAL CARE IF:   You think you are having a reaction to medicines taken.  You have recurrent headaches or feel dizzy.  You have swelling in your ankles.  You have trouble with your vision. SEEK IMMEDIATE MEDICAL CARE IF:  You develop a severe headache or confusion.  You have unusual weakness, numbness, or feel faint.  You have severe chest or abdominal pain.  You vomit repeatedly.  You have trouble breathing. MAKE SURE YOU:   Understand these instructions.  Will watch your condition.  Will get help right away if you are not doing well or get worse.   This information is not intended to replace advice given to you by your health care provider. Make sure you discuss any questions you have with your health care provider.   Document Released: 01/02/2005 Document Revised: 05/19/2014 Document Reviewed: 10/25/2012 Elsevier Interactive Patient Education 2016 Elsevier Inc[employer] DASH Eating Plan DASH stands for "Dietary Approaches to Stop Hypertension." The DASH eating plan is a healthy eating plan that has been shown to reduce high blood pressure (hypertension). Additional health benefits may include reducing the risk of type 2 diabetes mellitus, heart disease, and stroke. The DASH eating plan may also help with weight loss. WHAT DO I NEED TO KNOW ABOUT THE DASH EATING PLAN? For the DASH eating plan, you will follow these general guidelines:  Choose foods with a percent daily value for sodium of less than 5% (as listed on the food label).  Use salt-free seasonings or herbs instead of table salt or sea salt.  Check with your health care provider or pharmacist before using salt substitutes.  Eat lower-sodium products, often labeled as "lower sodium" or "no salt added."  Eat fresh foods.  Eat more vegetables, fruits, and low-fat dairy products.  Choose whole grains.  Look for the word "whole" as the first word in the ingredient list.  Choose fish and skinless chicken or turkey more often than red meat. Limit fish, poultry, and meat to 6 oz (170 g) each day.  Limit sweets, desserts, sugars, and sugary drinks.  Choose heart-healthy fats.  Limit cheese to 1 oz (28 g) per day.  Eat more home-cooked food and less restaurant, buffet, and fast food.  Limit fried foods.  Cook foods using methods other than frying.  Limit canned vegetables. If you do use them, rinse them well to decrease the sodium.  When eating at a restaurant, ask that your food be prepared with less salt, or no salt if possible. WHAT FOODS CAN I EAT? Seek help from a dietitian for individual calorie needs. Grains Whole grain or whole wheat bread. Brown rice. Whole grain or whole wheat pasta. Quinoa, bulgur, and whole grain cereals. Low-sodium cereals. Corn or whole wheat flour tortillas. Whole grain cornbread. Whole grain crackers. Low-sodium crackers. Vegetables Fresh or frozen vegetables (raw, steamed, roasted, or grilled). Low-sodium or reduced-sodium tomato and vegetable juices. Low-sodium or reduced-sodium tomato sauce and paste. Low-sodium or reduced-sodium canned vegetables.  Fruits All fresh, canned (in natural juice), or frozen fruits. Meat and Other   Protein Products Ground beef (85% or leaner), grass-fed beef, or beef trimmed of fat. Skinless chicken or turkey. Ground chicken or turkey. Pork trimmed of fat. All fish and seafood. Eggs. Dried beans, peas, or lentils. Unsalted nuts and seeds. Unsalted canned beans. Dairy Low-fat dairy products, such as skim or 1% milk, 2% or reduced-fat cheeses, low-fat ricotta or cottage cheese, or plain low-fat yogurt. Low-sodium or reduced-sodium cheeses. Fats and Oils Tub margarines without trans fats. Light or reduced-fat mayonnaise and salad dressings (reduced sodium). Avocado. Safflower, olive, or canola oils. Natural peanut or almond  butter. Other Unsalted popcorn and pretzels. The items listed above may not be a complete list of recommended foods or beverages. Contact your dietitian for more options. WHAT FOODS ARE NOT RECOMMENDED? Grains White bread. White pasta. White rice. Refined cornbread. Bagels and croissants. Crackers that contain trans fat. Vegetables Creamed or fried vegetables. Vegetables in a cheese sauce. Regular canned vegetables. Regular canned tomato sauce and paste. Regular tomato and vegetable juices. Fruits Dried fruits. Canned fruit in light or heavy syrup. Fruit juice. Meat and Other Protein Products Fatty cuts of meat. Ribs, chicken wings, bacon, sausage, bologna, salami, chitterlings, fatback, hot dogs, bratwurst, and packaged luncheon meats. Salted nuts and seeds. Canned beans with salt. Dairy Whole or 2% milk, cream, half-and-half, and cream cheese. Whole-fat or sweetened yogurt. Full-fat cheeses or blue cheese. Nondairy creamers and whipped toppings. Processed cheese, cheese spreads, or cheese curds. Condiments Onion and garlic salt, seasoned salt, table salt, and sea salt. Canned and packaged gravies. Worcestershire sauce. Tartar sauce. Barbecue sauce. Teriyaki sauce. Soy sauce, including reduced sodium. Steak sauce. Fish sauce. Oyster sauce. Cocktail sauce. Horseradish. Ketchup and mustard. Meat flavorings and tenderizers. Bouillon cubes. Hot sauce. Tabasco sauce. Marinades. Taco seasonings. Relishes. Fats and Oils Butter, stick margarine, lard, shortening, ghee, and bacon fat. Coconut, palm kernel, or palm oils. Regular salad dressings. Other Pickles and olives. Salted popcorn and pretzels. The items listed above may not be a complete list of foods and beverages to avoid. Contact your dietitian for more information. WHERE CAN I FIND MORE INFORMATION? National Heart, Lung, and Blood Institute: www.nhlbi.nih.gov/health/health-topics/topics/dash/   This information is not intended to replace  advice given to you by your health care provider. Make sure you discuss any questions you have with your health care provider.   Document Released: 12/22/2010 Document Revised: 01/23/2014 Document Reviewed: 11/06/2012 Elsevier Interactive Patient Education 2016 Elsevier Inc.  

## 2015-05-18 NOTE — Progress Notes (Signed)
Pre visit review using our clinic review tool, if applicable. No additional management support is needed unless otherwise documented below in the visit note. 

## 2015-05-26 ENCOUNTER — Other Ambulatory Visit (INDEPENDENT_AMBULATORY_CARE_PROVIDER_SITE_OTHER): Payer: BLUE CROSS/BLUE SHIELD

## 2015-05-26 ENCOUNTER — Telehealth: Payer: Self-pay | Admitting: Family Medicine

## 2015-05-26 DIAGNOSIS — Z0000 Encounter for general adult medical examination without abnormal findings: Secondary | ICD-10-CM

## 2015-05-26 DIAGNOSIS — Z0184 Encounter for antibody response examination: Secondary | ICD-10-CM

## 2015-05-26 DIAGNOSIS — E559 Vitamin D deficiency, unspecified: Secondary | ICD-10-CM | POA: Diagnosis not present

## 2015-05-26 LAB — CBC WITH DIFFERENTIAL/PLATELET
BASOS ABS: 0 10*3/uL (ref 0.0–0.1)
Basophils Relative: 0.6 % (ref 0.0–3.0)
EOS ABS: 0.1 10*3/uL (ref 0.0–0.7)
Eosinophils Relative: 2.2 % (ref 0.0–5.0)
HEMATOCRIT: 39.8 % (ref 36.0–46.0)
HEMOGLOBIN: 13.2 g/dL (ref 12.0–15.0)
LYMPHS PCT: 33.8 % (ref 12.0–46.0)
Lymphs Abs: 1.8 10*3/uL (ref 0.7–4.0)
MCHC: 33.1 g/dL (ref 30.0–36.0)
MCV: 86.4 fl (ref 78.0–100.0)
Monocytes Absolute: 0.5 10*3/uL (ref 0.1–1.0)
Monocytes Relative: 9.5 % (ref 3.0–12.0)
Neutro Abs: 2.9 10*3/uL (ref 1.4–7.7)
Neutrophils Relative %: 53.9 % (ref 43.0–77.0)
Platelets: 278 10*3/uL (ref 150.0–400.0)
RBC: 4.61 Mil/uL (ref 3.87–5.11)
RDW: 13.9 % (ref 11.5–15.5)
WBC: 5.4 10*3/uL (ref 4.0–10.5)

## 2015-05-26 LAB — HEPATIC FUNCTION PANEL
ALBUMIN: 4.1 g/dL (ref 3.5–5.2)
ALK PHOS: 73 U/L (ref 39–117)
ALT: 13 U/L (ref 0–35)
AST: 14 U/L (ref 0–37)
Bilirubin, Direct: 0.1 mg/dL (ref 0.0–0.3)
TOTAL PROTEIN: 7.7 g/dL (ref 6.0–8.3)
Total Bilirubin: 0.5 mg/dL (ref 0.2–1.2)

## 2015-05-26 LAB — BASIC METABOLIC PANEL
BUN: 10 mg/dL (ref 6–23)
CALCIUM: 9.4 mg/dL (ref 8.4–10.5)
CHLORIDE: 103 meq/L (ref 96–112)
CO2: 28 mEq/L (ref 19–32)
CREATININE: 0.69 mg/dL (ref 0.40–1.20)
GFR: 111.42 mL/min (ref >60.00)
Glucose, Bld: 157 mg/dL — ABNORMAL HIGH (ref 70–99)
Potassium: 4.5 mEq/L (ref 3.5–5.1)
Sodium: 139 mEq/L (ref 135–145)

## 2015-05-26 LAB — LIPID PANEL
CHOLESTEROL: 135 mg/dL (ref 0–200)
HDL: 48 mg/dL (ref >39.00)
LDL CALC: 76 mg/dL (ref 0–99)
NonHDL: 87.05
Total CHOL/HDL Ratio: 3
Triglycerides: 53 mg/dL (ref 0.0–149.0)
VLDL: 10.6 mg/dL (ref 0.0–40.0)

## 2015-05-26 LAB — VITAMIN D 25 HYDROXY (VIT D DEFICIENCY, FRACTURES): VITD: 34.46 ng/mL (ref 30.00–100.00)

## 2015-05-26 LAB — TSH: TSH: 2.25 u[IU]/mL (ref 0.35–4.50)

## 2015-05-26 NOTE — Telephone Encounter (Signed)
I think OK to wait.

## 2015-05-26 NOTE — Telephone Encounter (Signed)
Pt was scheduled 5/17 at 7 am for her cpe.  But this has to be rescheduled due to your meeting. Pt prefers the 7am, and the next available is June 14. Pt states her bp was high, and you wanted to recheck.   But if ok with you to wait until then, pt will take the June 14. Or work her in earlier day at a 7:30 am, (no other cpe slots until 6/14) she may can do that.

## 2015-05-27 LAB — VARICELLA ZOSTER ANTIBODY, IGG: VARICELLA IGG: 623.3 {index} — AB (ref <135.00)

## 2015-05-28 NOTE — Telephone Encounter (Signed)
Left message with pt and also sent message ok to keep 6/14 appointment.

## 2015-06-02 ENCOUNTER — Encounter: Payer: 59 | Admitting: Family Medicine

## 2015-06-21 ENCOUNTER — Ambulatory Visit (INDEPENDENT_AMBULATORY_CARE_PROVIDER_SITE_OTHER): Payer: BLUE CROSS/BLUE SHIELD | Admitting: Family Medicine

## 2015-06-21 ENCOUNTER — Encounter: Payer: Self-pay | Admitting: Family Medicine

## 2015-06-21 VITALS — BP 124/90 | HR 87 | Temp 98.4°F | Ht 66.0 in | Wt 236.0 lb

## 2015-06-21 DIAGNOSIS — Z0000 Encounter for general adult medical examination without abnormal findings: Secondary | ICD-10-CM

## 2015-06-21 DIAGNOSIS — R739 Hyperglycemia, unspecified: Secondary | ICD-10-CM | POA: Diagnosis not present

## 2015-06-21 DIAGNOSIS — Z0001 Encounter for general adult medical examination with abnormal findings: Secondary | ICD-10-CM | POA: Diagnosis not present

## 2015-06-21 LAB — POCT GLYCOSYLATED HEMOGLOBIN (HGB A1C): HEMOGLOBIN A1C: 6.7

## 2015-06-21 NOTE — Progress Notes (Signed)
Subjective:    Patient ID: Mary Romero, female    DOB: 10-13-1955, 60 y.o.   MRN: [id number]  HPI  Patient here for physical exam.  She is followed by gynecologist yearly.  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  She has recently made some significant lifestyle changes with reducing overall calories and especially sugars and starches. She has already lost 7 pounds over the past couple weeks. This was triggered by recent glucose fasting 157. This is the first time she had fasting blood sugar over 126. She's not had any polyuria or polydipsia.   She plans to get follow-up mammogram through her gynecologist.  Has never had shingles vaccine. Tetanus up-to-date. Pap smear up-to-date. Colonoscopy up-to-date. No specific risk factors for hepatitis C.  She's had at least 20 year history of some induration of soft tissue legs bilaterally. 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 She has been frustrated that she has not found anyone that can treat this.  Past Medical History  Diagnosis Date  . 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 TINNITUS 12/22/2008  . ALLERGIC RHINITIS 04/23/2009  . VITAMIN D DEFICIENCY 12/27/2009  . Depression   . Fibroid 2004  . BMI 37.0-37.9,adult 02/15/10  . Irregular menses 02/15/10  . H/O menorrhagia 2002  . ASCUS (atypical squamous cells of undetermined significance) on Pap smear 10/1999  . Perimenopause 09/2001  . Herpes genitalia 09/2001  . H/O dysmenorrhea 10/09/01  . ANEMIA-IRON DEFICIENCY 10/09/01  . H/O vaginal discharge 10/09/01    R/O std  . H/O vaginitis 06/17/02  . Dysuria 2004  . Fatigue   . H/O abdominal pain 03/2007  . PRB (rectal bleeding) 04/2007  . Post-menopausal bleeding 05/2007  . Irregular periods/menstrual cycles 6/09  . BV (bacterial vaginosis) 06/2007  . Hx: UTI (urinary tract infection) 11/2007  . H/O: obesity 12/20/09  .  Endometrial polyp 12/23/09   Past Surgical History  Procedure Laterality Date  . Eye surgery  2006    X 2  . Dilation and curettage of uterus  02/15/10  . Hysteroscopy  02/15/10    reports that she quit smoking about 16 years ago. Her smoking use included Cigarettes. She has a 12.5 pack-year smoking history. She has never used smokeless tobacco. She reports that she drinks alcohol. She reports that she does not use illicit drugs. family history includes Alzheimer's disease in her mother; Cancer in her maternal uncle and other; Diabetes in her mother; Heart disease in her father and maternal aunt; Hypertension in her father and mother. Allergies  Allergen Reactions  . Codeine Sulfate     REACTION: "out of body experience"  . Latex       Review of Systems  Constitutional: Negative for fever, activity change, appetite change, fatigue and unexpected weight change.  HENT: Negative for ear pain, hearing loss, sore throat and trouble swallowing.   Eyes: Negative for visual disturbance.  Respiratory: Negative for cough and shortness of breath.   Cardiovascular: Negative for chest pain and palpitations.  Gastrointestinal: Negative for abdominal pain, diarrhea, constipation and blood in stool.  Genitourinary: Negative for dysuria and hematuria.  Musculoskeletal: Negative for myalgias, back pain and arthralgias.  Skin: Negative for rash.  Neurological: Negative for dizziness, syncope and headaches.  Hematological: Negative for adenopathy.  Psychiatric/Behavioral: Negative for confusion and dysphoric mood.       Objective:   Physical Exam  Constitutional: She is oriented  to person, place, and time. She appears well-developed and well-nourished.  HENT:  Head: Normocephalic and atraumatic.  Eyes: EOM are normal. Pupils are equal, round, and reactive to light.  Neck: Normal range of motion. Neck supple. No thyromegaly present.  Cardiovascular: Normal rate, regular rhythm and normal heart  sounds.   No murmur heard. Pulmonary/Chest: Breath sounds normal. No respiratory distress. She has no wheezes. She has no rales.  Abdominal: Soft. Bowel sounds are normal. She exhibits no distension and no mass. There is no tenderness. There is no rebound and no guarding.  Musculoskeletal: Normal range of motion. She exhibits no edema.  She does have some slightly tender indurated soft tissue lower medial legs bilaterally. This has been very chronic. No pitting edema.  Lymphadenopathy:    She has no cervical adenopathy.  Neurological: She is alert and oriented to person, place, and time. She displays normal reflexes. No cranial nerve deficit.  Skin: No rash noted.  Psychiatric: She has a normal mood and affect. Her behavior is normal. Judgment and thought content normal.          Assessment & Plan:  Physical exam. Labs reviewed. Glucose 157. A1c was checked today is 6.7. We discussed options. She is not interested in medication such as metformin this time. Continue lifestyle modification. Recheck A1c in 3 months. She will look into insurance coverage for shingles vaccine. Continue weight loss efforts.  Eulas Post MD [facility]

## 2015-06-21 NOTE — Progress Notes (Signed)
Pre visit review using our clinic review tool, if applicable. No additional management support is needed unless otherwise documented below in the visit note. 

## 2015-06-30 ENCOUNTER — Encounter: Payer: BLUE CROSS/BLUE SHIELD | Admitting: Family Medicine

## 2015-08-16 LAB — HM PAP SMEAR

## 2015-08-16 LAB — HM MAMMOGRAPHY

## 2015-08-18 ENCOUNTER — Encounter: Payer: Self-pay | Admitting: Emergency Medicine

## 2015-08-23 ENCOUNTER — Ambulatory Visit: Payer: BLUE CROSS/BLUE SHIELD | Admitting: Gynecology

## 2015-09-06 ENCOUNTER — Other Ambulatory Visit: Payer: Self-pay | Admitting: Family Medicine

## 2015-09-17 ENCOUNTER — Ambulatory Visit: Payer: BLUE CROSS/BLUE SHIELD | Attending: Gynecologic Oncology | Admitting: Gynecologic Oncology

## 2015-09-17 ENCOUNTER — Encounter: Payer: Self-pay | Admitting: Gynecologic Oncology

## 2015-09-17 ENCOUNTER — Ambulatory Visit: Payer: BLUE CROSS/BLUE SHIELD

## 2015-09-17 VITALS — BP 144/79 | HR 64 | Temp 98.0°F | Resp 18 | Ht 66.0 in | Wt 226.3 lb

## 2015-09-17 DIAGNOSIS — Z91040 Latex allergy status: Secondary | ICD-10-CM | POA: Diagnosis not present

## 2015-09-17 DIAGNOSIS — Z8619 Personal history of other infectious and parasitic diseases: Secondary | ICD-10-CM | POA: Insufficient documentation

## 2015-09-17 DIAGNOSIS — D509 Iron deficiency anemia, unspecified: Secondary | ICD-10-CM | POA: Diagnosis not present

## 2015-09-17 DIAGNOSIS — N939 Abnormal uterine and vaginal bleeding, unspecified: Secondary | ICD-10-CM | POA: Insufficient documentation

## 2015-09-17 DIAGNOSIS — F329 Major depressive disorder, single episode, unspecified: Secondary | ICD-10-CM | POA: Diagnosis not present

## 2015-09-17 DIAGNOSIS — E669 Obesity, unspecified: Secondary | ICD-10-CM | POA: Insufficient documentation

## 2015-09-17 DIAGNOSIS — Z885 Allergy status to narcotic agent status: Secondary | ICD-10-CM | POA: Diagnosis not present

## 2015-09-17 DIAGNOSIS — D251 Intramural leiomyoma of uterus: Secondary | ICD-10-CM | POA: Insufficient documentation

## 2015-09-17 DIAGNOSIS — Z800 Family history of malignant neoplasm of digestive organs: Secondary | ICD-10-CM | POA: Insufficient documentation

## 2015-09-17 DIAGNOSIS — N951 Menopausal and female climacteric states: Secondary | ICD-10-CM | POA: Diagnosis not present

## 2015-09-17 DIAGNOSIS — Z87891 Personal history of nicotine dependence: Secondary | ICD-10-CM | POA: Insufficient documentation

## 2015-09-17 DIAGNOSIS — Z833 Family history of diabetes mellitus: Secondary | ICD-10-CM | POA: Diagnosis not present

## 2015-09-17 DIAGNOSIS — E559 Vitamin D deficiency, unspecified: Secondary | ICD-10-CM | POA: Insufficient documentation

## 2015-09-17 DIAGNOSIS — Z8249 Family history of ischemic heart disease and other diseases of the circulatory system: Secondary | ICD-10-CM | POA: Diagnosis not present

## 2015-09-17 DIAGNOSIS — Z87440 Personal history of urinary (tract) infections: Secondary | ICD-10-CM | POA: Insufficient documentation

## 2015-09-17 DIAGNOSIS — Z6837 Body mass index (BMI) 37.0-37.9, adult: Secondary | ICD-10-CM | POA: Insufficient documentation

## 2015-09-17 DIAGNOSIS — G47419 Narcolepsy without cataplexy: Secondary | ICD-10-CM | POA: Diagnosis not present

## 2015-09-17 DIAGNOSIS — Z809 Family history of malignant neoplasm, unspecified: Secondary | ICD-10-CM | POA: Diagnosis not present

## 2015-09-17 NOTE — Patient Instructions (Signed)
We will contact you with the results from your biopsy and your [facility] level . Pleases call with any changes , questions or concerns.  Endometrial Biopsy, Care After Refer to this sheet in the next few weeks. These instructions provide you with information on caring for yourself after your procedure. Your health care provider may also give you more specific instructions. Your treatment has been planned according to current medical practices, but problems sometimes occur. Call your health care provider if you have any problems or questions after your procedure. WHAT TO EXPECT AFTER THE PROCEDURE After your procedure, it is typical to have the following:  You may have mild cramping and a small amount of vaginal bleeding for a few days after the procedure. This is normal. HOME CARE INSTRUCTIONS  Only take over-the-counter or prescription medicine as directed by your health care provider.  Do not douche, use tampons, or have sexual intercourse until your health care provider approves.  Follow your health care provider's instructions regarding any activity restrictions, such as strenuous exercise or heavy lifting. SEEK MEDICAL CARE IF:  You have heavy bleeding or bleeding longer than 2 days after the procedure.  You have bad smelling drainage from your vagina.  You have a fever and chills.  Youhave severe lower stomach (abdominal) pain. SEEK IMMEDIATE MEDICAL CARE IF:  You have severe cramps in your stomach or back.  You pass large blood clots.  Your bleeding increases.  You become weak or lightheaded, or you pass out.   This information is not intended to replace advice given to you by your health care provider. Make sure you discuss any questions you have with your health care provider.   Document Released: 10/23/2012 Document Reviewed: 10/23/2012 Elsevier Interactive Patient Education [employer].

## 2015-09-17 NOTE — Progress Notes (Signed)
Consult Note: Gyn-Onc  Consult was requested by Dr. Raphael Romero for the evaluation of Mary Romero 60 y.o. female  CC:  Chief Complaint  Patient presents with  . abnormal uterine bleeding    new consultation    Assessment/Plan:  Mary Romero  is a 60 y.o.  year old with abnormal uterine bleeding.  It was unclear if [name] has transitioned through menopause. We will first check and [place] today to evaluate better for this. If this is low, it suggests that her cyclic bleeding is merely a result of her not yet transitioned through menopause. If this level is high, we will need to ensure we have adequate endometrial evaluation with pathology before assuming there is a nonmalignant source.  We will follow up the results of today's endometrial biopsy.  I'm very low suspicion of an occult uterine sarcoma given the stable size and appearance of her uterine fibroid over the course of a proximally 5 years.   HPI: Mary Romero is a very pleasant 53-year-old para swollen woman who is seen in consultation at the request of Dr. Raphael Romero for persistent uterine bleeding. The patient reports having regular cyclic menstrual bleeding into her 75s. She is now age 66. She reports her menstrual cycles to be 2-3 days long. She changes her pad to 3 times a day. She denies them being a heavy. She denies intermenstrual bleeding, or menorrhagia. She denies dysmenorrhea.  She is known to have uterine fibroids. She states that between 3 and 5 years ago a pelvic ultrasound scan was performed. This identified a 12 cm fibroid. This ultrasound scan was repeated on 09/01/2015 by Dr. Raphael Romero and demonstrated a stable appearance to start 12 cm intramural fibroid, in a 15 cm uterus. The endometrium could not well be characterized due to the presence of a fibroid. The patient reports having endometrial sampling performed approximately 3-5 years ago that was benign.  She reports symptoms of intermittent hot  flashes for several years. Her mother is deceased and she is unclear of what age her mother transitioned through menopause as well as her sister. Her family history is significant for ultimate uncles and aunts who are deceased from cancer but no first degree relatives. She is no history of prior abdominal surgeries.   Current Meds:  Outpatient Encounter Prescriptions as of 09/17/2015  Medication Sig  . Cholecalciferol (VITAMIN D) 2000 units tablet Take 2,000 Units by mouth daily.  . timolol (BETIMOL) 0.25 % ophthalmic solution Place 1-2 drops into the right eye daily.  . valACYclovir (VALTREX) 500 MG tablet Take 500 mg by mouth as needed.   Merril Abbe 10 MCG TABS vaginal tablet   . NAPROXEN PO Take by mouth as needed.   No facility-administered encounter medications on file as of 09/17/2015.     Allergy:  Allergies  Allergen Reactions  . Codeine Sulfate     REACTION: "out of body experience"  . Latex     Social Hx:   Social History   Social History  . Marital status: Married    Spouse name: N/A  . Number of children: N/A  . Years of education: N/A   Occupational History  . Not on file.   Social History Main Topics  . Smoking status: Former Smoker    Packs/day: 0.50    Years: 25.00    Types: Cigarettes    Quit date: 04/25/1999  . Smokeless tobacco: Never Used  . Alcohol use Yes     Comment: occ  .  Drug use: No  . Sexual activity: Yes   Other Topics Concern  . Not on file   Social History Narrative  . No narrative on file    Past Surgical Hx:  Past Surgical History:  Procedure Laterality Date  . DILATION AND CURETTAGE OF UTERUS  02/15/10  . EYE SURGERY  2006   X 2  . HYSTEROSCOPY  02/15/10    Past Medical Hx:  Past Medical History:  Diagnosis Date  . ALLERGIC RHINITIS 04/23/2009  . ANEMIA-IRON DEFICIENCY 10/09/01  . ASCUS (atypical squamous cells of undetermined significance) on Pap smear 10/1999  . BMI 37.0-37.9,adult 02/15/10  . BV (bacterial vaginosis) 06/2007   . Depression   . Dysuria 2004  . Endometrial polyp 12/23/09  . Fatigue   . Fibroid 2004  . H/O abdominal pain 03/2007  . H/O dysmenorrhea 10/09/01  . H/O menorrhagia 2002  . H/O vaginal discharge 10/09/01   R/O std  . H/O vaginitis 06/17/02  . H/O: obesity 12/20/09  . Herpes genitalia 09/2001  . Hx: UTI (urinary tract infection) 11/2007  . Irregular menses 02/15/10  . Irregular periods/menstrual cycles 6/09  . 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 Perimenopause 09/2001  . Post-menopausal bleeding 05/2007  . PRB (rectal bleeding) 04/2007  . TINNITUS 12/22/2008  . VITAMIN D DEFICIENCY 12/27/2009    Past Gynecological History:  No history of abnormal paps. Fibroids. No LMP recorded.  Family Hx:  Family History  Problem Relation Age of Onset  . Cancer Other     stomach, kidney  . Diabetes Mother   . Hypertension Mother   . Alzheimer's disease Mother   . Heart disease Father     CHF  . Hypertension Father   . Heart disease Maternal Aunt   . Cancer Maternal Uncle     Review of Systems:  Constitutional  Feels well,    ENT Normal appearing ears and nares bilaterally Skin/Breast  No rash, sores, jaundice, itching, dryness Cardiovascular  No chest pain, shortness of breath, or edema  Pulmonary  No cough or wheeze.  Gastro Intestinal  No nausea, vomitting, or diarrhoea. No bright red blood per rectum, no abdominal pain, change in bowel movement, or constipation.  Genito Urinary  No frequency, urgency, dysuria,  Musculo Skeletal  No myalgia, arthralgia, joint swelling or pain  Neurologic  No weakness, numbness, change in gait,  Psychology  No depression, anxiety, insomnia.   Vitals:  Blood pressure (!) 144/79, pulse 64, temperature 98 F (36.7 C), temperature source Oral, resp. rate 18, height 5\' 6"  (1.676 m), weight 226 lb 4.8 oz (102.6 kg), SpO2 100 %.  Physical Exam: WD in NAD Neck  Supple NROM, without any enlargements.  Lymph Node Survey No  cervical supraclavicular or inguinal adenopathy Cardiovascular  Pulse normal rate, regularity and rhythm. S1 and S2 normal.  Lungs  Clear to auscultation bilateraly, without wheezes/crackles/rhonchi. Good air movement.  Skin  No rash/lesions/breakdown  Psychiatry  Alert and oriented to person, place, and time  Abdomen  Normoactive bowel sounds, abdomen soft, non-tender and obese without evidence of hernia.  Back No CVA tenderness Genito Urinary  Vulva/vagina: Normal external female genitalia.  No lesions. No discharge or bleeding.  Bladder/urethra:  No lesions or masses, well supported bladder  Vagina: very long  Cervix: Normal appearing, no lesions.  Uterus:  Bulky, 16cm, mobile, no parametrial involvement or nodularity.  Adnexa: no palpable masses. Rectal  Good tone, no masses no cul de sac nodularity.  Extremities  No bilateral cyanosis, clubbing or edema.  PROCEDURE: ENDOMETRIAL BIOPSY Endometrial Biopsy  The procedure was explained.  The speculum was placed and the cervix was grasped with a tenaculum.  The endometrial biopsy was performed with 1 passes of the endometrial biopsy pipelle. The pipelle sounded to 12cm. Moderate tissue was obtained. The uterus sounded to 12 cm.  The patient tolerated procedure     Donaciano Eva, MD  09/17/2015, 10:23 AM

## 2015-09-22 ENCOUNTER — Ambulatory Visit (INDEPENDENT_AMBULATORY_CARE_PROVIDER_SITE_OTHER): Payer: BLUE CROSS/BLUE SHIELD | Admitting: Family Medicine

## 2015-09-22 ENCOUNTER — Telehealth: Payer: Self-pay

## 2015-09-22 VITALS — BP 138/88 | HR 82 | Temp 98.2°F | Ht 66.0 in | Wt 226.5 lb

## 2015-09-22 DIAGNOSIS — R030 Elevated blood-pressure reading, without diagnosis of hypertension: Secondary | ICD-10-CM | POA: Diagnosis not present

## 2015-09-22 DIAGNOSIS — R7303 Prediabetes: Secondary | ICD-10-CM

## 2015-09-22 DIAGNOSIS — Z8739 Personal history of other diseases of the musculoskeletal system and connective tissue: Secondary | ICD-10-CM

## 2015-09-22 DIAGNOSIS — IMO0001 Reserved for inherently not codable concepts without codable children: Secondary | ICD-10-CM

## 2015-09-22 LAB — FOLLICLE STIMULATING HORMONE: FSH: 63.3 m[IU]/mL

## 2015-09-22 LAB — LUTEINIZING HORMONE: LH: 28.3 m[IU]/mL

## 2015-09-22 LAB — POCT GLYCOSYLATED HEMOGLOBIN (HGB A1C): HEMOGLOBIN A1C: 6.3

## 2015-09-22 NOTE — Progress Notes (Signed)
Subjective:     Patient ID: Mary Romero, female   DOB: [birth year], 60 y.o.   MRN: [id number]  HPI Patient here for several issues as follows:  Recent elevated glucose 157. A1c 6.7%. We discussed possible metformin but she decided to work on diet first. She has eliminated sugars significantly. She is not monitoring blood sugars at home. No polyuria or polydipsia.  Borderline elevated blood pressures. Recently went to GYN with blood pressure 144/79. No headaches. No dizziness. Not monitoring blood pressure at home  Long history of subcutaneous thickening of tissue in her legs. She went to dermatologist and years ago had biopsy which this was labeled as "panniculitis ". She does have some discomfort. She has some hyperpigmentation changes on the surface. She has been apparently to multiple dermatologists over the years but has found no resolution. She would like to consider another opinion. She states she has had these lower leg changes for over 20 years. She does not have any similar changes in her upper extremities or trunk region.  Past Medical History:  Diagnosis Date  . ALLERGIC RHINITIS 04/23/2009  . ANEMIA-IRON DEFICIENCY 10/09/01  . ASCUS (atypical squamous cells of undetermined significance) on Pap smear 10/1999  . BMI 37.0-37.9,adult 02/15/10  . BV (bacterial vaginosis) 06/2007  . Depression   . Dysuria 2004  . Endometrial polyp 12/23/09  . Fatigue   . Fibroid 2004  . H/O abdominal pain 03/2007  . H/O dysmenorrhea 10/09/01  . H/O menorrhagia 2002  . H/O vaginal discharge 10/09/01   R/O std  . H/O vaginitis 06/17/02  . H/O: obesity 12/20/09  . Herpes genitalia 09/2001  . Hx: UTI (urinary tract infection) 11/2007  . Irregular menses 02/15/10  . Irregular periods/menstrual cycles 6/09  . 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 Perimenopause 09/2001  . Post-menopausal bleeding 05/2007  . PRB (rectal bleeding) 04/2007  . TINNITUS 12/22/2008  . VITAMIN D DEFICIENCY  12/27/2009   Past Surgical History:  Procedure Laterality Date  . DILATION AND CURETTAGE OF UTERUS  02/15/10  . EYE SURGERY  2006   X 2  . HYSTEROSCOPY  02/15/10    reports that she quit smoking about 16 years ago. Her smoking use included Cigarettes. She has a 12.50 pack-year smoking history. She has never used smokeless tobacco. She reports that she drinks alcohol. She reports that she does not use drugs. family history includes Alzheimer's disease in her mother; Cancer in her maternal uncle and other; Diabetes in her mother; Heart disease in her father and maternal aunt; Hypertension in her father and mother. Allergies  Allergen Reactions  . Codeine Sulfate     REACTION: "out of body experience"  . Latex      Review of Systems  Constitutional: Negative for fatigue.  Eyes: Negative for visual disturbance.  Respiratory: Negative for cough, chest tightness, shortness of breath and wheezing.   Cardiovascular: Negative for chest pain, palpitations and leg swelling.  Endocrine: Negative for polydipsia and polyuria.  Genitourinary: Negative for dysuria.  Neurological: Negative for dizziness, seizures, syncope, weakness, light-headedness and headaches.       Objective:   Physical Exam  Constitutional: She appears well-developed and well-nourished.  Eyes: Pupils are equal, round, and reactive to light.  Neck: Neck supple. No JVD present. No thyromegaly present.  Cardiovascular: Normal rate and regular rhythm.  Exam reveals no gallop.   Pulmonary/Chest: Effort normal and breath sounds normal. No respiratory distress. She has no wheezes. She has no rales.  Musculoskeletal: She exhibits no edema.  Neurological: She is alert.  Skin:  Patient has some mild hyperpigmentation changes lower medial leg. She has thickened subcutaneous tissue to palpation which is slightly tender. No warmth.       Assessment:     #1 type 2 diabetes recently diagnosed  #2 borderline elevated blood  pressure. Repeat reading left arm seated on the medium-size cuff 138/88  #3 chronic bilateral legs subcutaneous thickening-? Panniculitis changes    Plan:     -Repeat A1c=6.3% -Continue weight loss efforts and close monitoring of blood pressure -If blood pressures consistently greater than 140/90 follow-up sooner otherwise 3 months -Set up referral to [place] [facility]

## 2015-09-22 NOTE — Progress Notes (Signed)
Pre visit review using our clinic review tool, if applicable. No additional management support is needed unless otherwise documented below in the visit note. 

## 2015-09-22 NOTE — Telephone Encounter (Signed)
Orders received from [place] to contact the patient to update with endometrial biopsy results being "normal, no cancer seen". And that she is transitioning through menopause according to the lab results.No additional workup needed since there is no cancer. Attempted to contact the patient , no answer , left a detailed message with call back information if additional arise.

## 2015-10-19 ENCOUNTER — Telehealth: Payer: Self-pay | Admitting: Family Medicine

## 2015-10-19 NOTE — Telephone Encounter (Signed)
Pt has been referred to dermatologist and she does not want to go 3 hrs away to [place]. Pt will go to [place]. Please refer to [place]

## 2015-11-01 NOTE — Telephone Encounter (Signed)
Mary Romero please see my annotations below. I accidentally sent to Dr. Elease Hashimoto.

## 2015-11-01 NOTE — Telephone Encounter (Signed)
OK. Does she have anyone specific in mind?  I do not know any specific names of derm in [place]

## 2015-11-01 NOTE — Telephone Encounter (Signed)
Please see annotations below. Can we fax information to dermatology in [facility]?

## 2015-12-29 ENCOUNTER — Encounter: Payer: Self-pay | Admitting: Interventional Cardiology

## 2015-12-29 NOTE — Telephone Encounter (Signed)
This encounter was created in error - please disregard.

## 2015-12-29 NOTE — Telephone Encounter (Signed)
PT RETURNING CALL TO [place] CALL [phone]

## 2016-05-16 ENCOUNTER — Telehealth: Payer: Self-pay | Admitting: Family Medicine

## 2016-05-16 NOTE — Telephone Encounter (Signed)
Left message on machine for patient to returning her call. 

## 2016-05-16 NOTE — Telephone Encounter (Signed)
Pt would like to have a doctors note pt would not tell what day it was for state "just have them to give me a call and I will tell them".

## 2016-05-17 NOTE — Telephone Encounter (Signed)
Find out dates she was out.  If she needs FMLA papers filled out probably best to follow up.  Otherwise OK to provide note if we can get dates.

## 2016-05-17 NOTE — Telephone Encounter (Signed)
Patient is asking for a note to stay out of work because of the passing of her husband.  Note okay or would you like for her to schedule an office visit?

## 2016-05-17 NOTE — Telephone Encounter (Signed)
Pt returned your call.  

## 2016-05-17 NOTE — Telephone Encounter (Signed)
Appointment made 05/19/16

## 2016-05-18 DIAGNOSIS — F4320 Adjustment disorder, unspecified: Secondary | ICD-10-CM

## 2016-05-19 ENCOUNTER — Encounter: Payer: Self-pay | Admitting: Family Medicine

## 2016-05-19 ENCOUNTER — Ambulatory Visit (INDEPENDENT_AMBULATORY_CARE_PROVIDER_SITE_OTHER): Payer: BLUE CROSS/BLUE SHIELD | Admitting: Family Medicine

## 2016-05-19 VITALS — BP 140/90

## 2016-05-19 DIAGNOSIS — F4320 Adjustment disorder, unspecified: Secondary | ICD-10-CM

## 2016-05-19 DIAGNOSIS — F4321 Adjustment disorder with depressed mood: Secondary | ICD-10-CM

## 2016-05-19 NOTE — Progress Notes (Signed)
Pre visit review using our clinic review tool, if applicable. No additional management support is needed unless otherwise documented below in the visit note. 

## 2016-05-19 NOTE — Patient Instructions (Signed)

## 2016-05-19 NOTE — Progress Notes (Signed)
Subjective:     Patient ID: Mary Romero, female   DOB: 04-04-1955, 61 y.o.   MRN: [id number]  HPI Patient is here to discuss possible FMLA paper completion. Her husband died of combinations of aggressive cancer April 20. She's been out of work since April 3. He was seen through hospice. She is in process of trying to set up some counseling through hospice. She's also had some counseling through her pastor in church. She's having some difficulty sleeping intermittently as expected. She has a son nearby who has been very supportive. She feels it would be difficult to go back to work at this point. She states she has lots of "loose ends" that she is tying up.  Past Medical History:  Diagnosis Date  . ALLERGIC RHINITIS 04/23/2009  . ANEMIA-IRON DEFICIENCY 10/09/01  . ASCUS (atypical squamous cells of undetermined significance) on Pap smear 10/1999  . BMI 37.0-37.9,adult 02/15/10  . BV (bacterial vaginosis) 06/2007  . Depression   . Dysuria 2004  . Endometrial polyp 12/23/09  . Fatigue   . Fibroid 2004  . H/O abdominal pain 03/2007  . H/O dysmenorrhea 10/09/01  . H/O menorrhagia 2002  . H/O vaginal discharge 10/09/01   R/O std  . H/O vaginitis 06/17/02  . H/O: obesity 12/20/09  . Herpes genitalia 09/2001  . Hx: UTI (urinary tract infection) 11/2007  . Irregular menses 02/15/10  . Irregular periods/menstrual cycles 6/09  . 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 Perimenopause 09/2001  . Post-menopausal bleeding 05/2007  . PRB (rectal bleeding) 04/2007  . TINNITUS 12/22/2008  . VITAMIN D DEFICIENCY 12/27/2009   Past Surgical History:  Procedure Laterality Date  . DILATION AND CURETTAGE OF UTERUS  02/15/10  . EYE SURGERY  2006   X 2  . HYSTEROSCOPY  02/15/10    reports that she quit smoking about 17 years ago. Her smoking use included Cigarettes. She has a 12.50 pack-year smoking history. She has never used smokeless tobacco. She reports that she drinks alcohol. She reports  that she does not use drugs. family history includes Alzheimer's disease in her mother; Cancer in her maternal uncle and other; Diabetes in her mother; Heart disease in her father and maternal aunt; Hypertension in her father and mother. Allergies  Allergen Reactions  . Codeine Sulfate     REACTION: "out of body experience"  . Latex       Review of Systems  Constitutional: Negative for appetite change and unexpected weight change.  Neurological: Negative for headaches.  Psychiatric/Behavioral: Positive for dysphoric mood and sleep disturbance. Negative for suicidal ideas.       Objective:   Physical Exam  Constitutional: She appears well-developed and well-nourished.  Cardiovascular: Normal rate and regular rhythm.   Pulmonary/Chest: Effort normal and breath sounds normal. No respiratory distress. She has no wheezes. She has no rales.  Psychiatric: She has a normal mood and affect. Her behavior is normal. Judgment and thought content normal.       Assessment:     Grief related to husband recently passing away    Plan:     -We will complete FMLA papers and will keep her out of work April 3 through 06/19/2016. -She is in process setting up hospice counseling  Eulas Post MD [facility] at [facility]

## 2016-05-22 ENCOUNTER — Encounter: Payer: Self-pay | Admitting: Family Medicine

## 2016-06-28 ENCOUNTER — Ambulatory Visit (INDEPENDENT_AMBULATORY_CARE_PROVIDER_SITE_OTHER): Payer: BLUE CROSS/BLUE SHIELD | Admitting: Family Medicine

## 2016-06-28 ENCOUNTER — Encounter: Payer: Self-pay | Admitting: Family Medicine

## 2016-06-28 VITALS — BP 120/84 | HR 60 | Temp 98.9°F | Ht 66.5 in | Wt 215.6 lb

## 2016-06-28 DIAGNOSIS — Z0000 Encounter for general adult medical examination without abnormal findings: Secondary | ICD-10-CM

## 2016-06-28 LAB — HEPATIC FUNCTION PANEL
ALBUMIN: 4.1 g/dL (ref 3.5–5.2)
ALT: 16 U/L (ref 0–35)
AST: 16 U/L (ref 0–37)
Alkaline Phosphatase: 73 U/L (ref 39–117)
Bilirubin, Direct: 0.1 mg/dL (ref 0.0–0.3)
TOTAL PROTEIN: 7.6 g/dL (ref 6.0–8.3)
Total Bilirubin: 0.6 mg/dL (ref 0.2–1.2)

## 2016-06-28 LAB — LIPID PANEL
CHOLESTEROL: 155 mg/dL (ref 0–200)
HDL: 56.6 mg/dL (ref >39.00)
LDL Cholesterol: 88 mg/dL (ref 0–99)
NonHDL: 98.43
Total CHOL/HDL Ratio: 3
Triglycerides: 54 mg/dL (ref 0.0–149.0)
VLDL: 10.8 mg/dL (ref 0.0–40.0)

## 2016-06-28 LAB — CBC WITH DIFFERENTIAL/PLATELET
BASOS PCT: 0.9 % (ref 0.0–3.0)
Basophils Absolute: 0 10*3/uL (ref 0.0–0.1)
EOS PCT: 2.3 % (ref 0.0–5.0)
Eosinophils Absolute: 0.1 10*3/uL (ref 0.0–0.7)
HCT: 38.5 % (ref 36.0–46.0)
HEMOGLOBIN: 12.9 g/dL (ref 12.0–15.0)
Lymphocytes Relative: 34.3 % (ref 12.0–46.0)
Lymphs Abs: 1.6 10*3/uL (ref 0.7–4.0)
MCHC: 33.6 g/dL (ref 30.0–36.0)
MCV: 87.9 fl (ref 78.0–100.0)
MONO ABS: 0.5 10*3/uL (ref 0.1–1.0)
MONOS PCT: 10.7 % (ref 3.0–12.0)
Neutro Abs: 2.4 10*3/uL (ref 1.4–7.7)
Neutrophils Relative %: 51.8 % (ref 43.0–77.0)
Platelets: 246 10*3/uL (ref 150.0–400.0)
RBC: 4.39 Mil/uL (ref 3.87–5.11)
RDW: 14 % (ref 11.5–15.5)
WBC: 4.6 10*3/uL (ref 4.0–10.5)

## 2016-06-28 LAB — BASIC METABOLIC PANEL
BUN: 12 mg/dL (ref 6–23)
CO2: 26 mEq/L (ref 19–32)
Calcium: 9.5 mg/dL (ref 8.4–10.5)
Chloride: 106 mEq/L (ref 96–112)
Creatinine, Ser: 0.63 mg/dL (ref 0.40–1.20)
GFR: 123.3 mL/min (ref >60.00)
Glucose, Bld: 138 mg/dL — ABNORMAL HIGH (ref 70–99)
POTASSIUM: 4 meq/L (ref 3.5–5.1)
SODIUM: 139 meq/L (ref 135–145)

## 2016-06-28 LAB — TSH: TSH: 2.41 u[IU]/mL (ref 0.35–4.50)

## 2016-06-28 LAB — VITAMIN D 25 HYDROXY (VIT D DEFICIENCY, FRACTURES): VITD: 27.58 ng/mL — ABNORMAL LOW (ref 30.00–100.00)

## 2016-06-28 LAB — HEMOGLOBIN A1C: Hgb A1c MFr Bld: 6.5 % (ref 4.6–6.5)

## 2016-06-28 NOTE — Progress Notes (Signed)
Subjective:     Patient ID: Mary Romero, female   DOB: 1955-06-01, 61 y.o.   MRN: [id number]  HPI HEENT for physical exam. Her husband passed away from cancer over month ago. She has had one episode of hospice counseling and plans to continue with that. Overall, she is coping very well. She unfortunately lost her job last week. She'll be looking at alternative employment soon. Her past medical history is significant for vitamin D deficiency, history of GERD, obesity, prediabetes, reported narcolepsy. She's done excellent job with weight loss are the past year. Last A1c 6.3%. No polyuria or polydipsia. Previous history of vitamin D deficiency. Requesting recheck.  We'll need repeat colonoscopy next year. Gets mammograms yearly. Pap smear is up-to-date. Still sees GYN. Nonsmoker. No regular alcohol use.  Past Medical History:  Diagnosis Date  . ALLERGIC RHINITIS 04/23/2009  . ANEMIA-IRON DEFICIENCY 10/09/01  . ASCUS (atypical squamous cells of undetermined significance) on Pap smear 10/1999  . BMI 37.0-37.9,adult 02/15/10  . BV (bacterial vaginosis) 06/2007  . Depression   . Dysuria 2004  . Endometrial polyp 12/23/09  . Fatigue   . Fibroid 2004  . H/O abdominal pain 03/2007  . H/O dysmenorrhea 10/09/01  . H/O menorrhagia 2002  . H/O vaginal discharge 10/09/01   R/O std  . H/O vaginitis 06/17/02  . H/O: obesity 12/20/09  . Herpes genitalia 09/2001  . Hx: UTI (urinary tract infection) 11/2007  . Irregular menses 02/15/10  . Irregular periods/menstrual cycles 6/09  . 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 Perimenopause 09/2001  . Post-menopausal bleeding 05/2007  . PRB (rectal bleeding) 04/2007  . TINNITUS 12/22/2008  . VITAMIN D DEFICIENCY 12/27/2009   Past Surgical History:  Procedure Laterality Date  . DILATION AND CURETTAGE OF UTERUS  02/15/10  . EYE SURGERY  2006   X 2  . HYSTEROSCOPY  02/15/10    reports that she quit smoking about 17 years ago. Her smoking use  included Cigarettes. She has a 12.50 pack-year smoking history. She has never used smokeless tobacco. She reports that she drinks alcohol. She reports that she does not use drugs. family history includes Alzheimer's disease in her mother; Cancer in her maternal uncle and other; Diabetes in her mother; Heart disease in her father and maternal aunt; Hypertension in her father and mother. Allergies  Allergen Reactions  . Codeine Sulfate     REACTION: "out of body experience"  . Latex      Review of Systems  Constitutional: Negative for activity change, appetite change, fatigue, fever and unexpected weight change.  HENT: Negative for ear pain, hearing loss, sore throat and trouble swallowing.   Eyes: Negative for visual disturbance.  Respiratory: Negative for cough and shortness of breath.   Cardiovascular: Negative for chest pain and palpitations.  Gastrointestinal: Negative for abdominal pain, blood in stool, constipation and diarrhea.  Endocrine: Negative for polydipsia and polyuria.  Genitourinary: Negative for dysuria and hematuria.  Musculoskeletal: Positive for arthralgias. Negative for back pain and myalgias.  Skin: Negative for rash.  Neurological: Negative for dizziness, syncope and headaches.  Hematological: Negative for adenopathy.  Psychiatric/Behavioral: Negative for confusion.       Objective:   Physical Exam  Constitutional: She is oriented to person, place, and time. She appears well-developed and well-nourished.  HENT:  Head: Normocephalic and atraumatic.  Eyes: EOM are normal. Pupils are equal, round, and reactive to light.  Neck: Normal range of motion. Neck supple. No thyromegaly present.  Cardiovascular: Normal rate, regular rhythm and normal heart sounds.   No murmur heard. Pulmonary/Chest: Breath sounds normal. No respiratory distress. She has no wheezes. She has no rales.  Abdominal: Soft. Bowel sounds are normal. She exhibits no distension and no mass. There  is no tenderness. There is no rebound and no guarding.  Genitourinary:  Genitourinary Comments: Per gyn   Musculoskeletal: Normal range of motion. She exhibits no edema.  Lymphadenopathy:    She has no cervical adenopathy.  Neurological: She is alert and oriented to person, place, and time. She displays normal reflexes. No cranial nerve deficit.  Skin: No rash noted.  Psychiatric: She has a normal mood and affect. Her behavior is normal. Judgment and thought content normal.       Assessment:     Physical exam. Patient continues to grieve regarding loss of husband over month ago. She has good support and is involved with counseling which she thinks will help. Will need repeat colonoscopy by next year    Plan:     -Obtain screening lab work. Include hepatitis C antibody and 25-hydroxy vitamin D level and A1c. -Colonoscopy next year -Continue with regular GYN follow-up and yearly mammograms  Eulas Post MD [facility] at [facility]

## 2016-06-28 NOTE — Patient Instructions (Signed)
Continue with weight loss efforts You will need repeat colonoscopy by next year. Try icing the right wrist 20 minutes 2-3 times daily.

## 2016-06-29 LAB — HEPATITIS C ANTIBODY: HCV AB: NEGATIVE

## 2016-07-06 ENCOUNTER — Telehealth: Payer: Self-pay

## 2016-07-06 NOTE — Telephone Encounter (Signed)
Patient called to ask about her Vit D. She states that she has been taking 1000U daily and would like to know how much she should be taking since her levels are low.  Dr.Burchette - Please advise. Thanks!

## 2016-07-07 NOTE — Telephone Encounter (Signed)
Spoke with pt and advised. Nothing further needed at this time.  ° °

## 2016-07-07 NOTE — Telephone Encounter (Signed)
Increased to 2000  International.units daily

## 2016-09-25 ENCOUNTER — Ambulatory Visit (INDEPENDENT_AMBULATORY_CARE_PROVIDER_SITE_OTHER): Payer: Self-pay | Admitting: Family Medicine

## 2016-09-25 ENCOUNTER — Encounter: Payer: Self-pay | Admitting: Family Medicine

## 2016-09-25 VITALS — BP 124/80 | HR 65 | Temp 98.7°F | Wt 223.2 lb

## 2016-09-25 DIAGNOSIS — R600 Localized edema: Secondary | ICD-10-CM

## 2016-09-25 MED ORDER — FUROSEMIDE 20 MG PO TABS: 20.0000 mg | ORAL_TABLET | Freq: Every day | ORAL | 5 refills | Status: DC | PRN

## 2016-09-25 NOTE — Progress Notes (Signed)
Subjective:     Patient ID: Mary Romero, female   DOB: 15-Nov-1955, 61 y.o.   MRN: [id number]  HPI Patient seen with bilateral foot and leg edema left greater than right. No history of heart failure. She's noticed symptoms past couple weeks. No dietary changes. Denies any dyspnea with exertion. No orthopnea. She takes occasional naproxen. No other regular occasions. She takes some over-the-counter supplements. She had multiple labs back in June with normal kidney function and normal TSH.Generally, does not eat a lot of sodium  Past Medical History:  Diagnosis Date  . ALLERGIC RHINITIS 04/23/2009  . ANEMIA-IRON DEFICIENCY 10/09/01  . ASCUS (atypical squamous cells of undetermined significance) on Pap smear 10/1999  . BMI 37.0-37.9,adult 02/15/10  . BV (bacterial vaginosis) 06/2007  . Depression   . Dysuria 2004  . Endometrial polyp 12/23/09  . Fatigue   . Fibroid 2004  . H/O abdominal pain 03/2007  . H/O dysmenorrhea 10/09/01  . H/O menorrhagia 2002  . H/O vaginal discharge 10/09/01   R/O std  . H/O vaginitis 06/17/02  . H/O: obesity 12/20/09  . Herpes genitalia 09/2001  . Hx: UTI (urinary tract infection) 11/2007  . Irregular menses 02/15/10  . Irregular periods/menstrual cycles 6/09  . 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 Perimenopause 09/2001  . Post-menopausal bleeding 05/2007  . PRB (rectal bleeding) 04/2007  . TINNITUS 12/22/2008  . VITAMIN D DEFICIENCY 12/27/2009   Past Surgical History:  Procedure Laterality Date  . DILATION AND CURETTAGE OF UTERUS  02/15/10  . EYE SURGERY  2006   X 2  . HYSTEROSCOPY  02/15/10    reports that she quit smoking about 17 years ago. Her smoking use included Cigarettes. She has a 12.50 pack-year smoking history. She has never used smokeless tobacco. She reports that she drinks alcohol. She reports that she does not use drugs. family history includes Alzheimer's disease in her mother; Cancer in her maternal uncle and other; Diabetes  in her mother; Heart disease in her father and maternal aunt; Hypertension in her father and mother. Allergies  Allergen Reactions  . Codeine Sulfate     REACTION: "out of body experience"  . Latex     Review of Systems  Constitutional: Negative for fatigue.  Eyes: Negative for visual disturbance.  Respiratory: Negative for cough, chest tightness, shortness of breath and wheezing.   Cardiovascular: Positive for leg swelling. Negative for chest pain and palpitations.  Neurological: Negative for dizziness, seizures, syncope, weakness, light-headedness and headaches.       Objective:   Physical Exam  Constitutional: She appears well-developed and well-nourished.  Eyes: Pupils are equal, round, and reactive to light.  Neck: Neck supple. No JVD present. No thyromegaly present.  Cardiovascular: Normal rate and regular rhythm.  Exam reveals no gallop.   Pulmonary/Chest: Effort normal and breath sounds normal. No respiratory distress. She has no wheezes. She has no rales.  Musculoskeletal: She exhibits edema.  She has trace pitting edema right leg foot and ankle and 1+ left foot and trace left leg  Neurological: She is alert.       Assessment:     Bilateral leg and foot edema left greater than right. Suspect probably venous stasis related.    Plan:     -Watch sodium intake closely and try to keep less than 2000 mg daily -Consider compression stockings-knee high -Elevate legs frequently -Low-dose furosemide 20 mg 1 daily as needed for edema. Recommended against regular use. She's had previous  intolerance of HCTZ -Follow-up for any progressive edema or other concerns  Eulas Post MD [facility] at [facility]

## 2016-09-25 NOTE — Patient Instructions (Signed)
Edema Edema is an abnormal buildup of fluids in your bodytissues. Edema is somewhatdependent on gravity to pull the fluid to the lowest place in your body. That makes the condition more common in the legs and thighs (lower extremities). Painless swelling of the feet and ankles is common and becomes more likely as you get older. It is also common in looser tissues, like around your eyes. When the affected area is squeezed, the fluid may move out of that spot and leave a dent for a few moments. This dent is called pitting. What are the causes? There are many possible causes of edema. Eating too much salt and being on your feet or sitting for a long time can cause edema in your legs and ankles. Hot weather may make edema worse. Common medical causes of edema include:  Heart failure.  Liver disease.  Kidney disease.  Weak blood vessels in your legs.  Cancer.  An injury.  Pregnancy.  Some medications.  Obesity.  What are the signs or symptoms? Edema is usually painless.Your skin may look swollen or shiny. How is this diagnosed? Your health care provider may be able to diagnose edema by asking about your medical history and doing a physical exam. You may need to have tests such as X-rays, an electrocardiogram, or blood tests to check for medical conditions that may cause edema. How is this treated? Edema treatment depends on the cause. If you have heart, liver, or kidney disease, you need the treatment appropriate for these conditions. General treatment may include:  Elevation of the affected body part above the level of your heart.  Compression of the affected body part. Pressure from elastic bandages or support stockings squeezes the tissues and forces fluid back into the blood vessels. This keeps fluid from entering the tissues.  Restriction of fluid and salt intake.  Use of a water pill (diuretic). These medications are appropriate only for some types of edema. They pull fluid  out of your body and make you urinate more often. This gets rid of fluid and reduces swelling, but diuretics can have side effects. Only use diuretics as directed by your health care provider.  Follow these instructions at home:  Keep the affected body part above the level of your heart when you are lying down.  Do not sit still or stand for prolonged periods.  Do not put anything directly under your knees when lying down.  Do not wear constricting clothing or garters on your upper legs.  Exercise your legs to work the fluid back into your blood vessels. This may help the swelling go down.  Wear elastic bandages or support stockings to reduce ankle swelling as directed by your health care provider.  Eat a low-salt diet to reduce fluid if your health care provider recommends it.  Only take medicines as directed by your health care provider. Contact a health care provider if:  Your edema is not responding to treatment.  You have heart, liver, or kidney disease and notice symptoms of edema.  You have edema in your legs that does not improve after elevating them.  You have sudden and unexplained weight gain. Get help right away if:  You develop shortness of breath or chest pain.  You cannot breathe when you lie down.  You develop pain, redness, or warmth in the swollen areas.  You have heart, liver, or kidney disease and suddenly get edema.  You have a fever and your symptoms suddenly get worse. This information is   not intended to replace advice given to you by your health care provider. Make sure you discuss any questions you have with your health care provider. Document Released: 01/02/2005 Document Revised: 06/10/2015 Document Reviewed: 10/25/2012 Elsevier Interactive Patient Education  2017 Elsevier Inc.  

## 2016-12-27 ENCOUNTER — Ambulatory Visit: Payer: BLUE CROSS/BLUE SHIELD | Admitting: Family Medicine

## 2017-01-03 ENCOUNTER — Ambulatory Visit: Payer: Self-pay | Admitting: Family Medicine

## 2017-02-02 ENCOUNTER — Ambulatory Visit (INDEPENDENT_AMBULATORY_CARE_PROVIDER_SITE_OTHER): Payer: 59 | Admitting: Family Medicine

## 2017-02-02 ENCOUNTER — Encounter: Payer: Self-pay | Admitting: Family Medicine

## 2017-02-02 VITALS — BP 110/80 | HR 61 | Temp 98.3°F | Wt 217.0 lb

## 2017-02-02 DIAGNOSIS — R7309 Other abnormal glucose: Secondary | ICD-10-CM

## 2017-02-02 LAB — POCT GLYCOSYLATED HEMOGLOBIN (HGB A1C): HEMOGLOBIN A1C: 6.3

## 2017-02-02 NOTE — Progress Notes (Signed)
Subjective:     Patient ID: Mary Romero, female   DOB: 1955/08/10, 62 y.o.   MRN: [id number]  HPI Patient here for follow-up type 2 diabetes/hyperglycemia. Edema from last visit the legs has resolved. She has been watching her sugar and carb intake very diligently and has lost about 6 pounds from last visit. Not consistently exercising. She has taken on a new job and is excited about that. She currently is not taking any regular prescription medications. No polyuria or polydipsia  Past Medical History:  Diagnosis Date  . ALLERGIC RHINITIS 04/23/2009  . ANEMIA-IRON DEFICIENCY 10/09/01  . ASCUS (atypical squamous cells of undetermined significance) on Pap smear 10/1999  . BMI 37.0-37.9,adult 02/15/10  . BV (bacterial vaginosis) 06/2007  . Depression   . Dysuria 2004  . Endometrial polyp 12/23/09  . Fatigue   . Fibroid 2004  . H/O abdominal pain 03/2007  . H/O dysmenorrhea 10/09/01  . H/O menorrhagia 2002  . H/O vaginal discharge 10/09/01   R/O std  . H/O vaginitis 06/17/02  . H/O: obesity 12/20/09  . Herpes genitalia 09/2001  . Hx: UTI (urinary tract infection) 11/2007  . Irregular menses 02/15/10  . Irregular periods/menstrual cycles 6/09  . 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 Perimenopause 09/2001  . Post-menopausal bleeding 05/2007  . PRB (rectal bleeding) 04/2007  . TINNITUS 12/22/2008  . VITAMIN D DEFICIENCY 12/27/2009   Past Surgical History:  Procedure Laterality Date  . DILATION AND CURETTAGE OF UTERUS  02/15/10  . EYE SURGERY  2006   X 2  . HYSTEROSCOPY  02/15/10    reports that she quit smoking about 17 years ago. Her smoking use included cigarettes. She has a 12.50 pack-year smoking history. she has never used smokeless tobacco. She reports that she drinks alcohol. She reports that she does not use drugs. family history includes Alzheimer's disease in her mother; Cancer in her maternal uncle and other; Diabetes in her mother; Heart disease in her father  and maternal aunt; Hypertension in her father and mother. Allergies  Allergen Reactions  . Codeine Sulfate     REACTION: "out of body experience"  . Latex      Review of Systems  Constitutional: Negative for fatigue.  Eyes: Negative for visual disturbance.  Respiratory: Negative for cough, chest tightness, shortness of breath and wheezing.   Cardiovascular: Negative for chest pain, palpitations and leg swelling.  Endocrine: Negative for polydipsia and polyuria.  Neurological: Negative for dizziness, seizures, syncope, weakness, light-headedness and headaches.       Objective:   Physical Exam  Constitutional: She appears well-developed and well-nourished.  Eyes: Pupils are equal, round, and reactive to light.  Neck: Neck supple. No JVD present. No thyromegaly present.  Cardiovascular: Normal rate and regular rhythm. Exam reveals no gallop.  No murmur heard. Pulmonary/Chest: Effort normal and breath sounds normal. No respiratory distress. She has no wheezes. She has no rales.  Musculoskeletal: She exhibits no edema.  Neurological: She is alert.       Assessment:     Hyperglycemia. Borderline type 2 diabetes. A1c slightly improved today 6.3%    Plan:     Continue lifestyle management. Reassess A1c at follow-up for physical in 6 months Continue low glycemic diet and increased exercise  Eulas Post MD  Primary Care at [facility]

## 2017-05-01 ENCOUNTER — Telehealth: Payer: Self-pay | Admitting: Family Medicine

## 2017-05-01 NOTE — Telephone Encounter (Signed)
Left message on machine for patient to return our call.  CRM created 

## 2017-05-01 NOTE — Telephone Encounter (Signed)
Copied from [place] [id number]. Topic: Inquiry >> Apr 17, 2017  4:36 PM Cecelia Byars, NT wrote: Reason for [id number] is taking a trip out of the country and would like to what vaccines she needs ,please advise [phone]  >> Apr 17, 2017  4:47 PM Nimmons, Emilio Math, RN wrote: I left a detailed message for pt to call [place] for this information. They can advise what vaccines would be needed for what country they are traveling to and also give injections there.  >> May 01, 2017  1:52 PM Boyd Kerbs wrote: Pt. Is calling to get advise about what shots she needs to go to [place].  Was not sure if would need any or all of what they recommended.  Hep-A Typhoid Cholera Malaria

## 2017-05-01 NOTE — Telephone Encounter (Signed)
Pt advised of notes of Dr. Elease Hashimoto on 05/01/17. Pt verbalized understanding. Appt scheduled 05/09/17 to address vaccines needed for trip out of the country.

## 2017-05-01 NOTE — Telephone Encounter (Signed)
I would offer her follow up here and we can address.  The only thing she may need that we could not provide would be yellow fever vaccine.  We will consult CDC guidelines to determine.

## 2017-05-09 ENCOUNTER — Ambulatory Visit: Payer: 59 | Admitting: Family Medicine

## 2017-05-09 ENCOUNTER — Encounter: Payer: Self-pay | Admitting: Family Medicine

## 2017-05-09 VITALS — BP 134/80 | HR 66 | Temp 98.2°F | Ht 66.5 in | Wt 218.7 lb

## 2017-05-09 DIAGNOSIS — Z7184 Encounter for health counseling related to travel: Secondary | ICD-10-CM

## 2017-05-09 DIAGNOSIS — Z23 Encounter for immunization: Secondary | ICD-10-CM | POA: Diagnosis not present

## 2017-05-09 DIAGNOSIS — Z7189 Other specified counseling: Secondary | ICD-10-CM

## 2017-05-09 MED ORDER — CIPROFLOXACIN HCL 500 MG PO TABS: ORAL_TABLET | ORAL | 0 refills | Status: DC

## 2017-05-09 MED ORDER — TYPHOID VACCINE PO CPDR: DELAYED_RELEASE_CAPSULE | ORAL | 0 refills | Status: DC

## 2017-05-09 MED ORDER — ATOVAQUONE-PROGUANIL HCL 250-100 MG PO TABS: ORAL_TABLET | ORAL | 0 refills | Status: DC

## 2017-05-09 NOTE — Progress Notes (Signed)
Subjective:     Patient ID: Mary Romero, female   DOB: [birth year], 62 y.o.   MRN: [id number]  HPI Patient here to discuss travel medicine issues. She is going to [place] in one month.  Her tetanus is up-to-date. No history of hepatitis A. She'll need malaria prevention. She's not had previous typhoid vaccination. There is no recommendation for yellow fever vaccine unless she is going from country with yellow fever- which she is not. She will be staying in a motel most of the time.  Past Medical History:  Diagnosis Date  . ALLERGIC RHINITIS 04/23/2009  . ANEMIA-IRON DEFICIENCY 10/09/01  . ASCUS (atypical squamous cells of undetermined significance) on Pap smear 10/1999  . BMI 37.0-37.9,adult 02/15/10  . BV (bacterial vaginosis) 06/2007  . Depression   . Dysuria 2004  . Endometrial polyp 12/23/09  . Fatigue   . Fibroid 2004  . H/O abdominal pain 03/2007  . H/O dysmenorrhea 10/09/01  . H/O menorrhagia 2002  . H/O vaginal discharge 10/09/01   R/O std  . H/O vaginitis 06/17/02  . H/O: obesity 12/20/09  . Herpes genitalia 09/2001  . Hx: UTI (urinary tract infection) 11/2007  . Irregular menses 02/15/10  . Irregular periods/menstrual cycles 6/09  . 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 Perimenopause 09/2001  . Post-menopausal bleeding 05/2007  . PRB (rectal bleeding) 04/2007  . TINNITUS 12/22/2008  . VITAMIN D DEFICIENCY 12/27/2009   Past Surgical History:  Procedure Laterality Date  . DILATION AND CURETTAGE OF UTERUS  02/15/10  . EYE SURGERY  2006   X 2  . HYSTEROSCOPY  02/15/10    reports that she quit smoking about 18 years ago. Her smoking use included cigarettes. She has a 12.50 pack-year smoking history. She has never used smokeless tobacco. She reports that she drinks alcohol. She reports that she does not use drugs. family history includes Alzheimer's disease in her mother; Cancer in her maternal uncle and other; Diabetes in her mother; Heart disease in her father  and maternal aunt; Hypertension in her father and mother. Allergies  Allergen Reactions  . Codeine Sulfate     REACTION: "out of body experience"  . Latex      Review of Systems  Constitutional: Negative for chills and fever.       Objective:   Physical Exam  Constitutional: She appears well-developed and well-nourished.  Cardiovascular: Normal rate and regular rhythm.  Pulmonary/Chest: Effort normal and breath sounds normal. No respiratory distress. She has no wheezes. She has no rales.       Assessment:     Travel medicine advice encounter    Plan:     -Discussed prevention of water and food borne illness with handout given -Malarone 250 mg take 1 tablet daily starting 1-2 days prior to travel, during travel, and for 1 week after return -Vivotif 1 capsule every other day for 4 doses -Hepatitis A vaccine given and recommend booster in 6 months -Cipro 500 mg twice daily for 3 days as needed for traveler's diarrhea  Eulas Post MD [facility] at [facility] [place]

## 2017-05-09 NOTE — Patient Instructions (Signed)
Food Poisoning and Traveling Food poisoning is an illness caused by organisms present in something you ate or drank. Some types of food poisoning trigger symptoms quickly. Others may take 1-2 weeks for symptoms to appear. Symptoms of food poisoning include:  Diarrhea.  Cramping.  Fever.  Vomiting.  Dizziness.  Aches and pains.  Before you travel, learn as much as you can about the foodborne illnesses that are common in the areas where you are going. The risk for food poisoning varies from country to country and from one region of the world to another. Countries with a low risk include:  [place].  [place].  [place].  [place].  [place].  Some countries in [place].  Countries with a mid-range risk include:  Countries in [place].  [place].  Some [place] islands.  Countries with a high risk include:  Countries in Asia.  Countries in the [place].  Countries in [place].  [place].  Countries in [place] and [place].  What types of illness can be passed through food and drinks? Most cases of food poisoning are caused by bacteria or viruses, such as:  E. coli.  Campylobacteriosis.  Shigellosis.  Salmonellosis.  Norovirus.  Rotavirus.  Astrovirus.  Food poisoning can also be caused by some microscopic parasites. These are organisms that live off of another larger organism. Illness caused by parasites can take 1-2 weeks to appear and may last several months. The illnesses include:  Giardiasis.  Amebiasis.  Cyclosporiasis.  Cryptosporidiosis.  Medicines are available to treat these infections. How can I decrease my risk of food poisoning while traveling? Good hand hygiene always helps protect your health. Carry small bottles of alcohol-based hand sanitizer. Use it to clean your hands before you eat. Also follow these basic guidelines for eating and drinking while traveling. Foods that are generally safe to  eat:  Food that is thoroughly cooked.  Food that is served hot.  Hard-boiled eggs.  Fruits and vegetables you wash and peel yourself.  Milk or cheese that is treated with high heat (pasteurized).  Foods to avoid:  Raw or undercooked foods.  Raw or runny eggs.  Food that is not hot (such as food that has been on a buffet or picnic table for a while).  Raw fruits or vegetables that have not been washed and peeled.  Other items made with fresh vegetables or fruits, like salad and salsa.  Milk or cheese that is not pasteurized.  Meat from local animals, such as monkeys and bats.  Drinks that are generally safe:  Bottled waters, soda, or sports drinks.  Drinks you know were sealed until you opened them.  Water you know has been treated, boiled, or filtered to remove microorganisms.  Ice from treated or bottled water.  Drinks made with boiling water, such as tea or coffee.  Pasteurized milk.  Drinks to avoid:  Water from the tap or a well.  Water from a fresh water source, such as a stream.  Ice from a tap, well, or fresh water source.  Beverages that include water from a well, tap, or fresh water source.  Milk that is not pasteurized.  Beverages from soda fountains.  What should I do if I think I have developed food poisoning while traveling?  If you have been vomiting or have diarrhea, drink water or other fluids to replace what you lost.  Take over-the-counter medicine to stop diarrhea. Consider packing a supply of antidiarrheal medicine to take with you.    Contact your health care provider if your symptoms do not clear up after a few days. How is food poisoning treated? Most cases of food poisoning go away without treatment within 48 hours. Food poisoning caused by bacteria may be treated with antibiotic medicines.Viruses cannot be treated with antibiotics.Illnesses caused by parasites might respond to antiparasitic medicines. You can also treat symptoms  of food poisoning with medicines to:  Prevent or slow diarrhea (antidiarrheals).  Rehydrate your body. Oral rehydration therapy (ORT) helps your body replace fluids and electrolytes lost in diarrhea or vomit.  Treat rashes caused by diarrhea using hydrocortisone cream.  Should I be proactively treated for food poisoning before I travel? Talk to your health care provider about your personal risk for contracting food poisoning while traveling. Discuss the foodborne illnesses that are common in the areas you plan to visit. Make sure you understand how to use any medicines your health care provider recommends. Some medicines can help prevent food poisoning. These include:  Bismuth subsalicylate (BSS). This is an ingredient in over-the-counter antidiarrheal medicines. It might help some adult travelers prevent illness.  Probiotics or "good" bacteria. Taking probiotics might help prevent some illnesses.  Antibiotics. These protect against bacteria only. Taking antibiotics to prevent food poisoning is usually suggested only for people who have a compromised immune system.  How can I learn more?  Visit a travel medicine clinic or speak with a health care provider who specializes in travel medicine as soon as you know your travel plans.  Check the Travelers' Health section on the website of the Centers for Disease Control and Prevention (CDC): http://wwwnc.cdc.gov/travel This information is not intended to replace advice given to you by your health care provider. Make sure you discuss any questions you have with your health care provider. Document Released: 03/25/2002 Document Revised: 06/01/2015 Document Reviewed: 04/11/2013 Elsevier Interactive Patient Education  2018 Elsevier Inc.  

## 2017-10-24 ENCOUNTER — Other Ambulatory Visit: Payer: Self-pay

## 2017-10-24 ENCOUNTER — Ambulatory Visit (INDEPENDENT_AMBULATORY_CARE_PROVIDER_SITE_OTHER): Payer: 59 | Admitting: Family Medicine

## 2017-10-24 ENCOUNTER — Encounter: Payer: Self-pay | Admitting: Family Medicine

## 2017-10-24 VITALS — BP 138/82 | HR 65 | Temp 98.0°F | Ht 65.0 in | Wt 222.2 lb

## 2017-10-24 DIAGNOSIS — R300 Dysuria: Secondary | ICD-10-CM | POA: Diagnosis not present

## 2017-10-24 DIAGNOSIS — Z0000 Encounter for general adult medical examination without abnormal findings: Secondary | ICD-10-CM | POA: Diagnosis not present

## 2017-10-24 LAB — POCT URINALYSIS DIPSTICK
BILIRUBIN UA: NEGATIVE
Blood, UA: POSITIVE
Glucose, UA: NEGATIVE
Ketones, UA: NEGATIVE
Nitrite, UA: NEGATIVE
PH UA: 6 (ref 5.0–8.0)
Protein, UA: NEGATIVE
Spec Grav, UA: 1.015 (ref 1.010–1.025)
UROBILINOGEN UA: 0.2 U/dL

## 2017-10-24 MED ORDER — DICLOFENAC SODIUM 1 % TD GEL: 2.0000 g | Freq: Four times a day (QID) | TRANSDERMAL | 3 refills | Status: DC

## 2017-10-24 MED ORDER — NITROFURANTOIN MONOHYD MACRO 100 MG PO CAPS
100.0000 mg | ORAL_CAPSULE | Freq: Two times a day (BID) | ORAL | 0 refills | Status: DC
Start: 2017-10-24 — End: 2021-02-15

## 2017-10-24 NOTE — Patient Instructions (Signed)
Consider shingles vaccine (Shingrix) and check on insurance coverage.  Try to lose some weight.

## 2017-10-24 NOTE — Progress Notes (Signed)
Subjective:     Patient ID: Mary Romero, female   DOB: 1955-10-23, 62 y.o.   MRN: [id number]  HPI Patient seen for physical exam.  She sees gynecologist regularly for Pap smears and mammograms.  She is in process of setting up repeat colonoscopy this year.  She has had some recent mild burning with urination.  No fevers or chills.  She saw her gynecologist recently and had urine dipstick which apparently showed some blood but microscopy apparently was negative.  Not clear if culture was done.  Currently takes no regular medications.  Strong family history of diabetes in sisters and mother.  She declines flu vaccine.  No history of shingles vaccine.  She has history of uterine fibroids.  She just reached menopause this year.  She saw her gynecologist couple weeks ago with some vague suprapubic pain.  Plan was to start Vagifem and set up CT abdomen pelvis if not improved in a couple weeks.  Past Medical History:  Diagnosis Date  . ALLERGIC RHINITIS 04/23/2009  . ANEMIA-IRON DEFICIENCY 10/09/01  . ASCUS (atypical squamous cells of undetermined significance) on Pap smear 10/1999  . BMI 37.0-37.9,adult 02/15/10  . BV (bacterial vaginosis) 06/2007  . Depression   . Dysuria 2004  . Endometrial polyp 12/23/09  . Fatigue   . Fibroid 2004  . H/O abdominal pain 03/2007  . H/O dysmenorrhea 10/09/01  . H/O menorrhagia 2002  . H/O vaginal discharge 10/09/01   R/O std  . H/O vaginitis 06/17/02  . H/O: obesity 12/20/09  . Herpes genitalia 09/2001  . Hx: UTI (urinary tract infection) 11/2007  . Irregular menses 02/15/10  . Irregular periods/menstrual cycles 6/09  . 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 Perimenopause 09/2001  . Post-menopausal bleeding 05/2007  . PRB (rectal bleeding) 04/2007  . TINNITUS 12/22/2008  . VITAMIN D DEFICIENCY 12/27/2009   Past Surgical History:  Procedure Laterality Date  . DILATION AND CURETTAGE OF UTERUS  02/15/10  . EYE SURGERY  2006   X 2  .  HYSTEROSCOPY  02/15/10    reports that she quit smoking about 18 years ago. Her smoking use included cigarettes. She has a 12.50 pack-year smoking history. She has never used smokeless tobacco. She reports that she drinks alcohol. She reports that she does not use drugs. family history includes Alzheimer's disease in her mother; Cancer in her maternal uncle and other; Diabetes in her mother; Heart disease in her father and maternal aunt; Hypertension in her father and mother. Allergies  Allergen Reactions  . Codeine Sulfate     REACTION: "out of body experience"  . Latex      Review of Systems  Constitutional: Negative for activity change, appetite change, fatigue, fever and unexpected weight change.  HENT: Negative for ear pain, hearing loss, sore throat and trouble swallowing.   Eyes: Negative for visual disturbance.  Respiratory: Negative for cough and shortness of breath.   Cardiovascular: Negative for chest pain and palpitations.  Gastrointestinal: Negative for abdominal pain, blood in stool, constipation and diarrhea.  Genitourinary: Positive for dysuria. Negative for difficulty urinating, flank pain and hematuria.  Musculoskeletal: Negative for arthralgias, back pain and myalgias.  Skin: Negative for rash.  Neurological: Negative for dizziness, syncope and headaches.  Hematological: Negative for adenopathy.  Psychiatric/Behavioral: Negative for confusion and dysphoric mood.       Objective:   Physical Exam  Constitutional: She appears well-developed and well-nourished.  HENT:  Right Ear: External ear normal.  Left  Ear: External ear normal.  Mouth/Throat: Oropharynx is clear and moist.  Eyes: Pupils are equal, round, and reactive to light.  Neck: Neck supple. No JVD present. No thyromegaly present.  Cardiovascular: Normal rate and regular rhythm. Exam reveals no gallop.  Pulmonary/Chest: Effort normal and breath sounds normal. No respiratory distress. She has no wheezes.  She has no rales.  Genitourinary:  Genitourinary Comments: Per gyn  Musculoskeletal: She exhibits no edema.  Neurological: She is alert.       Assessment:     Physical exam.  Several health maintenance issues addressed as below    Plan:     -Flu vaccine recommended and patient declines -She will check on insurance coverage for shingles vaccine -Repeat colonoscopy has been scheduled (per pt) -She will continue with regular GYN follow-ups for mammograms -Check lab work.  Include A1c with strong family history of diabetes and past history of mild hyperglycemia.  Patient also requesting repeat vitamin D level. -Patient requesting diclofenac gel for knee arthritis.  We reviewed potential side effects.  May use 3 times daily to 4 times daily as needed  Eulas Post MD [facility] at [facility]

## 2017-10-25 LAB — BASIC METABOLIC PANEL
BUN: 12 mg/dL (ref 6–23)
CO2: 27 mEq/L (ref 19–32)
CREATININE: 0.67 mg/dL (ref 0.40–1.20)
Calcium: 9.2 mg/dL (ref 8.4–10.5)
Chloride: 105 mEq/L (ref 96–112)
GFR: 114.35 mL/min (ref >60.00)
Glucose, Bld: 124 mg/dL — ABNORMAL HIGH (ref 70–99)
Potassium: 3.8 mEq/L (ref 3.5–5.1)
Sodium: 140 mEq/L (ref 135–145)

## 2017-10-25 LAB — HEPATIC FUNCTION PANEL
ALT: 14 U/L (ref 0–35)
AST: 17 U/L (ref 0–37)
Albumin: 4 g/dL (ref 3.5–5.2)
Alkaline Phosphatase: 66 U/L (ref 39–117)
BILIRUBIN DIRECT: 0.1 mg/dL (ref 0.0–0.3)
TOTAL PROTEIN: 7.5 g/dL (ref 6.0–8.3)
Total Bilirubin: 0.4 mg/dL (ref 0.2–1.2)

## 2017-10-25 LAB — LIPID PANEL
CHOLESTEROL: 151 mg/dL (ref 0–200)
HDL: 57.3 mg/dL (ref >39.00)
LDL Cholesterol: 80 mg/dL (ref 0–99)
NonHDL: 93.55
TRIGLYCERIDES: 68 mg/dL (ref 0.0–149.0)
Total CHOL/HDL Ratio: 3
VLDL: 13.6 mg/dL (ref 0.0–40.0)

## 2017-10-25 LAB — CBC WITH DIFFERENTIAL/PLATELET
BASOS ABS: 0.1 10*3/uL (ref 0.0–0.1)
Basophils Relative: 1.5 % (ref 0.0–3.0)
EOS PCT: 2.3 % (ref 0.0–5.0)
Eosinophils Absolute: 0.1 10*3/uL (ref 0.0–0.7)
HEMATOCRIT: 38.5 % (ref 36.0–46.0)
Hemoglobin: 12.9 g/dL (ref 12.0–15.0)
LYMPHS PCT: 38.5 % (ref 12.0–46.0)
Lymphs Abs: 2.2 10*3/uL (ref 0.7–4.0)
MCHC: 33.6 g/dL (ref 30.0–36.0)
MCV: 87.6 fl (ref 78.0–100.0)
MONOS PCT: 9.2 % (ref 3.0–12.0)
Monocytes Absolute: 0.5 10*3/uL (ref 0.1–1.0)
Neutro Abs: 2.7 10*3/uL (ref 1.4–7.7)
Neutrophils Relative %: 48.5 % (ref 43.0–77.0)
PLATELETS: 227 10*3/uL (ref 150.0–400.0)
RBC: 4.4 Mil/uL (ref 3.87–5.11)
RDW: 14 % (ref 11.5–15.5)
WBC: 5.7 10*3/uL (ref 4.0–10.5)

## 2017-10-25 LAB — URINALYSIS, MICROSCOPIC ONLY

## 2017-10-25 LAB — TSH: TSH: 2.15 u[IU]/mL (ref 0.35–4.50)

## 2017-10-25 LAB — HEMOGLOBIN A1C: HEMOGLOBIN A1C: 6.1 % (ref 4.6–6.5)

## 2017-10-25 LAB — URINE CULTURE
MICRO NUMBER: 91215262
SPECIMEN QUALITY:: ADEQUATE

## 2017-10-25 LAB — VITAMIN D 25 HYDROXY (VIT D DEFICIENCY, FRACTURES): VITD: 29.18 ng/mL — ABNORMAL LOW (ref 30.00–100.00)

## 2017-10-29 ENCOUNTER — Other Ambulatory Visit (HOSPITAL_COMMUNITY): Payer: Self-pay | Admitting: Obstetrics and Gynecology

## 2017-10-29 ENCOUNTER — Other Ambulatory Visit (HOSPITAL_COMMUNITY): Payer: Self-pay | Admitting: Pulmonary Disease

## 2017-10-29 ENCOUNTER — Telehealth: Payer: Self-pay | Admitting: Family Medicine

## 2017-10-29 DIAGNOSIS — R109 Unspecified abdominal pain: Secondary | ICD-10-CM

## 2017-10-29 NOTE — Telephone Encounter (Signed)
Copied from [place] [id number]. Topic: General - Other >> Oct 29, 2017  4:45 PM Valla Leaver wrote: Reason for CRM: Patient needs lab results from 10/09. Patient needs all results sent Dr. Raphael Gibney w/ [facility] ob/gyn and Dr. Benson Norway w/ [facility]. Does not have fax#.

## 2017-10-31 NOTE — Telephone Encounter (Signed)
Called patient yesterday and let her know that I have faxed the lab results to the doctors that she requested. Also patient asked to have labs mailed to her home and these were mailed yesterday. Gave patient lab results over the phone. Patient verbalized an understanding.

## 2017-11-01 ENCOUNTER — Encounter (HOSPITAL_COMMUNITY): Payer: Self-pay

## 2017-11-01 ENCOUNTER — Ambulatory Visit (HOSPITAL_COMMUNITY): Payer: 59

## 2018-11-30 ENCOUNTER — Other Ambulatory Visit: Payer: Self-pay | Admitting: Family Medicine

## 2018-12-24 ENCOUNTER — Telehealth: Payer: Self-pay | Admitting: *Deleted

## 2018-12-24 NOTE — Telephone Encounter (Signed)
We can discuss at physical.

## 2018-12-24 NOTE — Telephone Encounter (Signed)
Copied from [place] [id number]. Topic: General - Other >> Dec 24, 2018  3:49 PM Erick Blinks wrote: Reason for CRM: Pt wants to add Vitamin D lab and Blood type (advised that Blood type is available at the hospital) to her CPE

## 2019-01-03 ENCOUNTER — Telehealth: Payer: Self-pay

## 2019-01-03 ENCOUNTER — Ambulatory Visit (INDEPENDENT_AMBULATORY_CARE_PROVIDER_SITE_OTHER): Payer: 59 | Admitting: Family Medicine

## 2019-01-03 ENCOUNTER — Encounter: Payer: Self-pay | Admitting: Family Medicine

## 2019-01-03 ENCOUNTER — Other Ambulatory Visit: Payer: Self-pay

## 2019-01-03 VITALS — BP 128/62 | HR 70 | Temp 97.6°F | Wt 204.0 lb

## 2019-01-03 DIAGNOSIS — Z789 Other specified health status: Secondary | ICD-10-CM

## 2019-01-03 DIAGNOSIS — Z0000 Encounter for general adult medical examination without abnormal findings: Secondary | ICD-10-CM

## 2019-01-03 LAB — CBC WITH DIFFERENTIAL/PLATELET
Basophils Absolute: 0.1 10*3/uL (ref 0.0–0.1)
Basophils Relative: 1.1 % (ref 0.0–3.0)
Eosinophils Absolute: 0.3 10*3/uL (ref 0.0–0.7)
Eosinophils Relative: 6.5 % — ABNORMAL HIGH (ref 0.0–5.0)
HCT: 38 % (ref 36.0–46.0)
Hemoglobin: 12.6 g/dL (ref 12.0–15.0)
Lymphocytes Relative: 29.9 % (ref 12.0–46.0)
Lymphs Abs: 1.4 10*3/uL (ref 0.7–4.0)
MCHC: 33.2 g/dL (ref 30.0–36.0)
MCV: 89.3 fl (ref 78.0–100.0)
Monocytes Absolute: 0.4 10*3/uL (ref 0.1–1.0)
Monocytes Relative: 8.9 % (ref 3.0–12.0)
Neutro Abs: 2.5 10*3/uL (ref 1.4–7.7)
Neutrophils Relative %: 53.6 % (ref 43.0–77.0)
Platelets: 212 10*3/uL (ref 150.0–400.0)
RBC: 4.26 Mil/uL (ref 3.87–5.11)
RDW: 14.7 % (ref 11.5–15.5)
WBC: 4.7 10*3/uL (ref 4.0–10.5)

## 2019-01-03 LAB — BASIC METABOLIC PANEL
BUN: 15 mg/dL (ref 6–23)
CO2: 29 mEq/L (ref 19–32)
Calcium: 9.1 mg/dL (ref 8.4–10.5)
Chloride: 105 mEq/L (ref 96–112)
Creatinine, Ser: 0.61 mg/dL (ref 0.40–1.20)
GFR: 119.44 mL/min (ref >60.00)
Glucose, Bld: 111 mg/dL — ABNORMAL HIGH (ref 70–99)
Potassium: 3.9 mEq/L (ref 3.5–5.1)
Sodium: 141 mEq/L (ref 135–145)

## 2019-01-03 LAB — HEPATIC FUNCTION PANEL
ALT: 14 U/L (ref 0–35)
AST: 16 U/L (ref 0–37)
Albumin: 4 g/dL (ref 3.5–5.2)
Alkaline Phosphatase: 76 U/L (ref 39–117)
Bilirubin, Direct: 0.1 mg/dL (ref 0.0–0.3)
Total Bilirubin: 0.4 mg/dL (ref 0.2–1.2)
Total Protein: 7.3 g/dL (ref 6.0–8.3)

## 2019-01-03 LAB — LIPID PANEL
Cholesterol: 199 mg/dL (ref 0–200)
HDL: 53.7 mg/dL (ref >39.00)
LDL Cholesterol: 134 mg/dL — ABNORMAL HIGH (ref 0–99)
NonHDL: 145.3
Total CHOL/HDL Ratio: 4
Triglycerides: 57 mg/dL (ref 0.0–149.0)
VLDL: 11.4 mg/dL (ref 0.0–40.0)

## 2019-01-03 LAB — VITAMIN D 25 HYDROXY (VIT D DEFICIENCY, FRACTURES): VITD: 55.12 ng/mL (ref 30.00–100.00)

## 2019-01-03 LAB — TSH: TSH: 1.96 u[IU]/mL (ref 0.35–4.50)

## 2019-01-03 LAB — HEMOGLOBIN A1C: Hgb A1c MFr Bld: 5.5 % (ref 4.6–6.5)

## 2019-01-03 NOTE — Telephone Encounter (Signed)
Copied from [place] [id number]. Topic: General - Other >> Jan 03, 2019  9:47 AM Keene Breath wrote: Reason for CRM: Patient called to ask about the antibody test for COVID.  CB# [phone]

## 2019-01-03 NOTE — Patient Instructions (Signed)

## 2019-01-03 NOTE — Progress Notes (Signed)
Subjective:     Patient ID: Mary Romero, female   DOB: 12-Mar-1955, 63 y.o.   MRN: [id number]  HPI Sharol is seen for physical exam.  She just went through menopause last year.  Minimal hot flashes.  She is in process of establishing with another gynecologist.  She gets Pap smears and mammograms yearly through them.  Her colonoscopy is up-to-date.  She had colonoscopy last year.  She has not had shingles vaccine but will consider.  She declines flu vaccine.  Will need pneumonia vaccine by next year.  Currently doing a "keto "diet.  She plans to liberalize this some soon.  Generally feels well.  Positive family history of type 2 diabetes in mother.  Her A1c last year was 6.1% and she would like to have recheck.  She has had previous low vitamin D and request repeat on that as well.  She does take vitamin D supplement.  She has lost some weight since last year as documented below  Past Medical History:  Diagnosis Date  . ALLERGIC RHINITIS 04/23/2009  . ANEMIA-IRON DEFICIENCY 10/09/01  . ASCUS (atypical squamous cells of undetermined significance) on Pap smear 10/1999  . BMI 37.0-37.9,adult 02/15/10  . BV (bacterial vaginosis) 06/2007  . Depression   . Dysuria 2004  . Endometrial polyp 12/23/09  . Fatigue   . Fibroid 2004  . H/O abdominal pain 03/2007  . H/O dysmenorrhea 10/09/01  . H/O menorrhagia 2002  . H/O vaginal discharge 10/09/01   R/O std  . H/O vaginitis 06/17/02  . H/O: obesity 12/20/09  . Herpes genitalia 09/2001  . Hx: UTI (urinary tract infection) 11/2007  . Irregular menses 02/15/10  . Irregular periods/menstrual cycles 6/09  . 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 Perimenopause 09/2001  . Post-menopausal bleeding 05/2007  . PRB (rectal bleeding) 04/2007  . TINNITUS 12/22/2008  . VITAMIN D DEFICIENCY 12/27/2009   Past Surgical History:  Procedure Laterality Date  . DILATION AND CURETTAGE OF UTERUS  02/15/10  . EYE SURGERY  2006   X 2  . HYSTEROSCOPY   02/15/10    reports that she quit smoking about 19 years ago. Her smoking use included cigarettes. She has a 12.50 pack-year smoking history. She has never used smokeless tobacco. She reports current alcohol use. She reports that she does not use drugs. family history includes Alzheimer's disease in her mother; Cancer in her maternal uncle and another family member; Diabetes in her mother; Heart disease in her father and maternal aunt; Hypertension in her father and mother. Allergies  Allergen Reactions  . Codeine Sulfate     REACTION: "out of body experience"  . Latex    Wt Readings from Last 3 Encounters:  01/03/19 204 lb (92.5 kg)  10/24/17 222 lb 3.2 oz (100.8 kg)  05/09/17 218 lb 11.2 oz (99.2 kg)     Review of Systems  Constitutional: Negative for activity change, appetite change, fatigue, fever and unexpected weight change.  HENT: Negative for ear pain, hearing loss, sore throat and trouble swallowing.   Eyes: Negative for visual disturbance.  Respiratory: Negative for cough and shortness of breath.   Cardiovascular: Negative for chest pain and palpitations.  Gastrointestinal: Negative for abdominal pain, blood in stool, constipation and diarrhea.  Endocrine: Negative for polydipsia and polyuria.  Genitourinary: Negative for dysuria and hematuria.  Musculoskeletal: Negative for arthralgias, back pain and myalgias.  Skin: Negative for rash.  Neurological: Negative for dizziness, syncope and headaches.  Hematological: Negative  for adenopathy.  Psychiatric/Behavioral: Negative for confusion and dysphoric mood.       Objective:   Physical Exam Constitutional:      Appearance: She is well-developed.  HENT:     Head: Normocephalic and atraumatic.  Eyes:     Pupils: Pupils are equal, round, and reactive to light.  Neck:     Thyroid: No thyromegaly.  Cardiovascular:     Rate and Rhythm: Normal rate and regular rhythm.     Heart sounds: Normal heart sounds. No murmur.   Pulmonary:     Effort: No respiratory distress.     Breath sounds: Normal breath sounds. No wheezing or rales.  Abdominal:     General: Bowel sounds are normal. There is no distension.     Palpations: Abdomen is soft. There is no mass.     Tenderness: There is no abdominal tenderness. There is no guarding or rebound.  Musculoskeletal:        General: Normal range of motion.     Cervical back: Normal range of motion and neck supple.  Lymphadenopathy:     Cervical: No cervical adenopathy.  Skin:    Findings: No rash.  Neurological:     Mental Status: She is alert and oriented to person, place, and time.     Cranial Nerves: No cranial nerve deficit.     Deep Tendon Reflexes: Reflexes normal.  Psychiatric:        Behavior: Behavior normal.        Thought Content: Thought content normal.        Judgment: Judgment normal.        Assessment:     Physical exam.  Patient has history of obesity but has had some good success this past year with weight loss.  Generally feels well.  We discussed the following preventative issues    Plan:     -Flu vaccine recommended but declined -Discussed Shingrix vaccine and she will check on insurance coverage -Pneumonia vaccine by next year -She will continue GYN follow-up for Pap smears and mammogram -Colonoscopy up-to-date -Obtain follow-up screening labs  Eulas Post MD [facility] at [facility]

## 2019-01-05 ENCOUNTER — Encounter: Payer: Self-pay | Admitting: Family Medicine

## 2019-01-07 ENCOUNTER — Other Ambulatory Visit: Payer: Self-pay

## 2019-01-07 DIAGNOSIS — E7800 Pure hypercholesterolemia, unspecified: Secondary | ICD-10-CM

## 2019-01-07 NOTE — Addendum Note (Signed)
Addended by: Eulas Post on: 01/07/2019 10:29 AM   Modules accepted: Orders

## 2019-01-07 NOTE — Telephone Encounter (Signed)
Can you order the covid antibody test for this patient? Future order for 19mth to a year. Patient is coming back for repeat Lipid panel also. Thank you!

## 2019-01-07 NOTE — Telephone Encounter (Signed)
I have placed order for Covid IgG   Not sure if insurance is covering these

## 2019-04-07 DIAGNOSIS — Z961 Presence of intraocular lens: Secondary | ICD-10-CM | POA: Diagnosis not present

## 2019-04-07 DIAGNOSIS — H2512 Age-related nuclear cataract, left eye: Secondary | ICD-10-CM | POA: Diagnosis not present

## 2019-04-07 DIAGNOSIS — H4031X3 Glaucoma secondary to eye trauma, right eye, severe stage: Secondary | ICD-10-CM | POA: Diagnosis not present

## 2019-05-15 DIAGNOSIS — Z1239 Encounter for other screening for malignant neoplasm of breast: Secondary | ICD-10-CM | POA: Diagnosis not present

## 2019-05-15 DIAGNOSIS — R350 Frequency of micturition: Secondary | ICD-10-CM | POA: Diagnosis not present

## 2019-05-15 DIAGNOSIS — B009 Herpesviral infection, unspecified: Secondary | ICD-10-CM | POA: Diagnosis not present

## 2019-05-15 DIAGNOSIS — E8941 Symptomatic postprocedural ovarian failure: Secondary | ICD-10-CM | POA: Diagnosis not present

## 2019-05-15 DIAGNOSIS — Z01419 Encounter for gynecological examination (general) (routine) without abnormal findings: Secondary | ICD-10-CM | POA: Diagnosis not present

## 2019-05-15 DIAGNOSIS — Z1231 Encounter for screening mammogram for malignant neoplasm of breast: Secondary | ICD-10-CM | POA: Diagnosis not present

## 2019-05-27 DIAGNOSIS — H401121 Primary open-angle glaucoma, left eye, mild stage: Secondary | ICD-10-CM | POA: Diagnosis not present

## 2019-05-27 DIAGNOSIS — H4031X3 Glaucoma secondary to eye trauma, right eye, severe stage: Secondary | ICD-10-CM | POA: Diagnosis not present

## 2019-06-23 ENCOUNTER — Ambulatory Visit: Payer: No Typology Code available for payment source | Admitting: Family Medicine

## 2019-07-18 ENCOUNTER — Ambulatory Visit: Payer: No Typology Code available for payment source | Admitting: Family Medicine

## 2019-07-24 DIAGNOSIS — H4031X3 Glaucoma secondary to eye trauma, right eye, severe stage: Secondary | ICD-10-CM | POA: Diagnosis not present

## 2019-07-24 DIAGNOSIS — H401121 Primary open-angle glaucoma, left eye, mild stage: Secondary | ICD-10-CM | POA: Diagnosis not present

## 2019-08-25 DIAGNOSIS — Z20822 Contact with and (suspected) exposure to covid-19: Secondary | ICD-10-CM | POA: Diagnosis not present

## 2019-08-29 ENCOUNTER — Ambulatory Visit: Payer: No Typology Code available for payment source | Admitting: Family Medicine

## 2019-08-29 ENCOUNTER — Other Ambulatory Visit: Payer: Self-pay

## 2019-08-29 ENCOUNTER — Encounter: Payer: Self-pay | Admitting: Family Medicine

## 2019-08-29 VITALS — BP 126/64 | HR 65 | Temp 98.1°F | Wt 186.0 lb

## 2019-08-29 DIAGNOSIS — E785 Hyperlipidemia, unspecified: Secondary | ICD-10-CM | POA: Diagnosis not present

## 2019-08-29 LAB — LIPID PANEL
Cholesterol: 185 mg/dL (ref <200)
HDL: 58 mg/dL (ref >=50)
LDL Cholesterol (Calc): 114 mg/dL (calc) — ABNORMAL HIGH
Non-HDL Cholesterol (Calc): 127 mg/dL (calc) (ref <130)
Total CHOL/HDL Ratio: 3.2 (calc) (ref <5.0)
Triglycerides: 50 mg/dL (ref <150)

## 2019-08-29 NOTE — Patient Instructions (Signed)

## 2019-08-29 NOTE — Progress Notes (Signed)
Established Patient Office Visit  Subjective:  Patient ID: Mary Romero, female    DOB: 25-Nov-1955  Age: 64 y.o. MRN: [id number]  CC:  Chief Complaint  Patient presents with  . Follow-up    pt is here for 6 month follow up     HPI Mary Romero presents for follow-up hyperlipidemia. She is continued with modified keto diet and is doing extremely well. She is lost another almost 20 pounds since December. She feels very good overall. She is also doing some intermittent fasting. She is staying very active. Her total cholesterol is 199 with LDL 134 and triglycerides of 57. She is not aware of any family history of premature CAD. She does have a brother with heart failure but she is not sure the basis for his heart failure. She tries to follow a low saturated fat diet  Past Medical History:  Diagnosis Date  . ALLERGIC RHINITIS 04/23/2009  . ANEMIA-IRON DEFICIENCY 10/09/01  . ASCUS (atypical squamous cells of undetermined significance) on Pap smear 10/1999  . BMI 37.0-37.9,adult 02/15/10  . BV (bacterial vaginosis) 06/2007  . Depression   . Dysuria 2004  . Endometrial polyp 12/23/09  . Fatigue   . Fibroid 2004  . H/O abdominal pain 03/2007  . H/O dysmenorrhea 10/09/01  . H/O menorrhagia 2002  . H/O vaginal discharge 10/09/01   R/O std  . H/O vaginitis 06/17/02  . H/O: obesity 12/20/09  . Herpes genitalia 09/2001  . Hx: UTI (urinary tract infection) 11/2007  . Irregular menses 02/15/10  . Irregular periods/menstrual cycles 6/09  . 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 Perimenopause 09/2001  . Post-menopausal bleeding 05/2007  . PRB (rectal bleeding) 04/2007  . TINNITUS 12/22/2008  . VITAMIN D DEFICIENCY 12/27/2009    Past Surgical History:  Procedure Laterality Date  . DILATION AND CURETTAGE OF UTERUS  02/15/10  . EYE SURGERY  2006   X 2  . HYSTEROSCOPY  02/15/10    Family History  Problem Relation Age of Onset  . Cancer Other        stomach, kidney  .  Diabetes Mother   . Hypertension Mother   . Alzheimer's disease Mother   . Heart disease Father        CHF  . Hypertension Father   . Heart disease Maternal Aunt   . Cancer Maternal Uncle     Social History   Socioeconomic History  . Marital status: Widowed    Spouse name: Not on file  . Number of children: Not on file  . Years of education: Not on file  . Highest education level: Not on file  Occupational History  . Not on file  Tobacco Use  . Smoking status: Former Smoker    Packs/day: 0.50    Years: 25.00    Pack years: 12.50    Types: Cigarettes    Quit date: 04/25/1999    Years since quitting: 20.3  . Smokeless tobacco: Never Used  Vaping Use  . Vaping Use: Never used  Substance and Sexual Activity  . Alcohol use: Yes    Comment: occ  . Drug use: No  . Sexual activity: Yes  Other Topics Concern  . Not on file  Social History Narrative  . Not on file   Social Determinants of Health   Financial Resource Strain:   . Difficulty of Paying Living Expenses:   Food Insecurity:   . Worried About Charity fundraiser in the  Last Year:   . [place] in the Last Year:   Transportation Needs:   . Film/video editor (Medical):   Marland Kitchen Lack of Transportation (Non-Medical):   Physical Activity:   . Days of Exercise per Week:   . Minutes of Exercise per Session:   Stress:   . Feeling of Stress :   Social Connections:   . Frequency of Communication with Friends and Family:   . Frequency of Social Gatherings with Friends and Family:   . Attends Religious Services:   . Active Member of Clubs or Organizations:   . Attends Archivist Meetings:   Marland Kitchen Marital Status:   Intimate Partner Violence:   . Fear of Current or Ex-Partner:   . Emotionally Abused:   Marland Kitchen Physically Abused:   . Sexually Abused:     Outpatient Medications Prior to Visit  Medication Sig Dispense Refill  . Cholecalciferol (VITAMIN D) 2000 units tablet Take 2,000 Units by mouth daily.     . diclofenac Sodium (VOLTAREN) 1 % GEL APPLY 2 GRAMS TO AFFECTED AREA 4 TIMES A DAY 50 g 0  . NAPROXEN PO Take by mouth as needed.    . nitrofurantoin, macrocrystal-monohydrate, (MACROBID) 100 MG capsule Take 1 capsule (100 mg total) by mouth 2 (two) times daily. 10 capsule 0  . timolol (BETIMOL) 0.25 % ophthalmic solution Place 1-2 drops into the right eye daily.    . valACYclovir (VALTREX) 500 MG tablet Take 500 mg by mouth as needed.      No facility-administered medications prior to visit.    Allergies  Allergen Reactions  . Codeine Sulfate     REACTION: "out of body experience"  . Latex     ROS Review of Systems  Constitutional: Negative for fatigue.  Eyes: Negative for visual disturbance.  Respiratory: Negative for cough, chest tightness, shortness of breath and wheezing.   Cardiovascular: Negative for chest pain, palpitations and leg swelling.  Neurological: Negative for dizziness, seizures, syncope, weakness, light-headedness and headaches.      Objective:    Physical Exam Vitals reviewed.  Constitutional:      Appearance: Normal appearance.  Cardiovascular:     Rate and Rhythm: Normal rate and regular rhythm.  Pulmonary:     Effort: Pulmonary effort is normal.     Breath sounds: Normal breath sounds.  Musculoskeletal:     Right lower leg: No edema.     Left lower leg: No edema.  Neurological:     Mental Status: She is alert.     BP 126/64 (BP Location: Left Arm, Patient Position: Sitting, Cuff Size: Normal)   Pulse 65   Temp 98.1 F (36.7 C) (Oral)   Wt 186 lb (84.4 kg)   SpO2 97%   BMI 30.95 kg/m  Wt Readings from Last 3 Encounters:  08/29/19 186 lb (84.4 kg)  01/03/19 204 lb (92.5 kg)  10/24/17 222 lb 3.2 oz (100.8 kg)     Health Maintenance Due  Topic Date Due  . COVID-19 Vaccine (1) Never done  . HIV Screening  Never done    There are no preventive care reminders to display for this patient.  Lab Results  Component Value Date   TSH  1.96 01/03/2019   Lab Results  Component Value Date   WBC 4.7 01/03/2019   HGB 12.6 01/03/2019   HCT 38.0 01/03/2019   MCV 89.3 01/03/2019   PLT 212.0 01/03/2019   Lab Results  Component Value Date  NA 141 01/03/2019   K 3.9 01/03/2019   CO2 29 01/03/2019   GLUCOSE 111 (H) 01/03/2019   BUN 15 01/03/2019   CREATININE 0.61 01/03/2019   BILITOT 0.4 01/03/2019   ALKPHOS 76 01/03/2019   AST 16 01/03/2019   ALT 14 01/03/2019   PROT 7.3 01/03/2019   ALBUMIN 4.0 01/03/2019   CALCIUM 9.1 01/03/2019   GFR 119.44 01/03/2019   Lab Results  Component Value Date   CHOL 199 01/03/2019   Lab Results  Component Value Date   HDL 53.70 01/03/2019   Lab Results  Component Value Date   LDLCALC 134 (H) 01/03/2019   Lab Results  Component Value Date   TRIG 57.0 01/03/2019   Lab Results  Component Value Date   CHOLHDL 4 01/03/2019   Lab Results  Component Value Date   HGBA1C 5.5 01/03/2019      Assessment & Plan:   Problem List Items Addressed This Visit    None    Visit Diagnoses    Hyperlipidemia, unspecified hyperlipidemia type    -  Primary   Relevant Orders   Lipid panel    -Continue low saturated fat diet -Continue healthy weight loss program -Recheck fasting lipid panel  No orders of the defined types were placed in this encounter.   Follow-up: No follow-ups on file.    Carolann Littler, MD

## 2019-09-14 DIAGNOSIS — Z20822 Contact with and (suspected) exposure to covid-19: Secondary | ICD-10-CM | POA: Diagnosis not present

## 2019-11-21 DIAGNOSIS — Z20822 Contact with and (suspected) exposure to covid-19: Secondary | ICD-10-CM | POA: Diagnosis not present

## 2019-12-01 DIAGNOSIS — R69 Illness, unspecified: Secondary | ICD-10-CM | POA: Diagnosis not present

## 2020-01-17 DIAGNOSIS — Z1152 Encounter for screening for COVID-19: Secondary | ICD-10-CM | POA: Diagnosis not present

## 2020-03-24 DIAGNOSIS — H401121 Primary open-angle glaucoma, left eye, mild stage: Secondary | ICD-10-CM | POA: Diagnosis not present

## 2020-03-24 DIAGNOSIS — H4031X3 Glaucoma secondary to eye trauma, right eye, severe stage: Secondary | ICD-10-CM | POA: Diagnosis not present

## 2020-03-25 DIAGNOSIS — H4031X3 Glaucoma secondary to eye trauma, right eye, severe stage: Secondary | ICD-10-CM | POA: Diagnosis not present

## 2020-03-25 DIAGNOSIS — H401121 Primary open-angle glaucoma, left eye, mild stage: Secondary | ICD-10-CM | POA: Diagnosis not present

## 2020-04-05 DIAGNOSIS — R0781 Pleurodynia: Secondary | ICD-10-CM | POA: Diagnosis not present

## 2020-04-05 DIAGNOSIS — S63619A Unspecified sprain of unspecified finger, initial encounter: Secondary | ICD-10-CM | POA: Diagnosis not present

## 2020-04-05 DIAGNOSIS — M25511 Pain in right shoulder: Secondary | ICD-10-CM | POA: Diagnosis not present

## 2020-04-19 DIAGNOSIS — M25511 Pain in right shoulder: Secondary | ICD-10-CM | POA: Diagnosis not present

## 2020-04-19 DIAGNOSIS — R0781 Pleurodynia: Secondary | ICD-10-CM | POA: Diagnosis not present

## 2020-04-19 DIAGNOSIS — S63619A Unspecified sprain of unspecified finger, initial encounter: Secondary | ICD-10-CM | POA: Diagnosis not present

## 2020-05-20 DIAGNOSIS — B009 Herpesviral infection, unspecified: Secondary | ICD-10-CM | POA: Diagnosis not present

## 2020-05-20 DIAGNOSIS — Z13820 Encounter for screening for osteoporosis: Secondary | ICD-10-CM | POA: Diagnosis not present

## 2020-05-20 DIAGNOSIS — E8941 Symptomatic postprocedural ovarian failure: Secondary | ICD-10-CM | POA: Diagnosis not present

## 2020-05-20 DIAGNOSIS — Z1239 Encounter for other screening for malignant neoplasm of breast: Secondary | ICD-10-CM | POA: Diagnosis not present

## 2020-05-20 DIAGNOSIS — Z01419 Encounter for gynecological examination (general) (routine) without abnormal findings: Secondary | ICD-10-CM | POA: Diagnosis not present

## 2020-05-20 DIAGNOSIS — M810 Age-related osteoporosis without current pathological fracture: Secondary | ICD-10-CM | POA: Diagnosis not present

## 2020-05-20 DIAGNOSIS — Z1231 Encounter for screening mammogram for malignant neoplasm of breast: Secondary | ICD-10-CM | POA: Diagnosis not present

## 2020-05-27 DIAGNOSIS — H4031X3 Glaucoma secondary to eye trauma, right eye, severe stage: Secondary | ICD-10-CM | POA: Diagnosis not present

## 2020-05-27 DIAGNOSIS — Z961 Presence of intraocular lens: Secondary | ICD-10-CM | POA: Diagnosis not present

## 2020-05-27 DIAGNOSIS — H2512 Age-related nuclear cataract, left eye: Secondary | ICD-10-CM | POA: Diagnosis not present

## 2020-05-27 DIAGNOSIS — H401121 Primary open-angle glaucoma, left eye, mild stage: Secondary | ICD-10-CM | POA: Diagnosis not present

## 2020-06-29 DIAGNOSIS — Z20822 Contact with and (suspected) exposure to covid-19: Secondary | ICD-10-CM | POA: Diagnosis not present

## 2020-09-29 DIAGNOSIS — H401121 Primary open-angle glaucoma, left eye, mild stage: Secondary | ICD-10-CM | POA: Diagnosis not present

## 2020-09-29 DIAGNOSIS — H4031X3 Glaucoma secondary to eye trauma, right eye, severe stage: Secondary | ICD-10-CM | POA: Diagnosis not present

## 2020-10-07 DIAGNOSIS — R21 Rash and other nonspecific skin eruption: Secondary | ICD-10-CM | POA: Diagnosis not present

## 2020-10-19 DIAGNOSIS — Z8249 Family history of ischemic heart disease and other diseases of the circulatory system: Secondary | ICD-10-CM | POA: Diagnosis not present

## 2020-10-19 DIAGNOSIS — B009 Herpesviral infection, unspecified: Secondary | ICD-10-CM | POA: Diagnosis not present

## 2020-10-19 DIAGNOSIS — R030 Elevated blood-pressure reading, without diagnosis of hypertension: Secondary | ICD-10-CM | POA: Diagnosis not present

## 2020-10-19 DIAGNOSIS — R52 Pain, unspecified: Secondary | ICD-10-CM | POA: Diagnosis not present

## 2020-10-19 DIAGNOSIS — Z7722 Contact with and (suspected) exposure to environmental tobacco smoke (acute) (chronic): Secondary | ICD-10-CM | POA: Diagnosis not present

## 2020-10-19 DIAGNOSIS — Z791 Long term (current) use of non-steroidal anti-inflammatories (NSAID): Secondary | ICD-10-CM | POA: Diagnosis not present

## 2020-10-19 DIAGNOSIS — Z87891 Personal history of nicotine dependence: Secondary | ICD-10-CM | POA: Diagnosis not present

## 2020-10-19 DIAGNOSIS — Z809 Family history of malignant neoplasm, unspecified: Secondary | ICD-10-CM | POA: Diagnosis not present

## 2020-10-19 DIAGNOSIS — H409 Unspecified glaucoma: Secondary | ICD-10-CM | POA: Diagnosis not present

## 2020-10-19 DIAGNOSIS — E669 Obesity, unspecified: Secondary | ICD-10-CM | POA: Diagnosis not present

## 2020-10-19 DIAGNOSIS — M1990 Unspecified osteoarthritis, unspecified site: Secondary | ICD-10-CM | POA: Diagnosis not present

## 2020-10-19 DIAGNOSIS — Z6834 Body mass index (BMI) 34.0-34.9, adult: Secondary | ICD-10-CM | POA: Diagnosis not present

## 2020-10-22 ENCOUNTER — Ambulatory Visit: Payer: No Typology Code available for payment source | Admitting: Family Medicine

## 2020-11-11 DIAGNOSIS — L0232 Furuncle of buttock: Secondary | ICD-10-CM | POA: Diagnosis not present

## 2020-11-11 DIAGNOSIS — B9689 Other specified bacterial agents as the cause of diseases classified elsewhere: Secondary | ICD-10-CM | POA: Diagnosis not present

## 2020-11-16 DIAGNOSIS — H401121 Primary open-angle glaucoma, left eye, mild stage: Secondary | ICD-10-CM | POA: Diagnosis not present

## 2020-11-16 DIAGNOSIS — H4031X3 Glaucoma secondary to eye trauma, right eye, severe stage: Secondary | ICD-10-CM | POA: Diagnosis not present

## 2020-12-07 DIAGNOSIS — H401121 Primary open-angle glaucoma, left eye, mild stage: Secondary | ICD-10-CM | POA: Diagnosis not present

## 2020-12-07 DIAGNOSIS — H4031X3 Glaucoma secondary to eye trauma, right eye, severe stage: Secondary | ICD-10-CM | POA: Diagnosis not present

## 2021-01-18 DIAGNOSIS — H401121 Primary open-angle glaucoma, left eye, mild stage: Secondary | ICD-10-CM | POA: Diagnosis not present

## 2021-01-18 DIAGNOSIS — H4031X3 Glaucoma secondary to eye trauma, right eye, severe stage: Secondary | ICD-10-CM | POA: Diagnosis not present

## 2021-01-19 DIAGNOSIS — D251 Intramural leiomyoma of uterus: Secondary | ICD-10-CM | POA: Diagnosis not present

## 2021-01-19 DIAGNOSIS — R030 Elevated blood-pressure reading, without diagnosis of hypertension: Secondary | ICD-10-CM | POA: Diagnosis not present

## 2021-01-19 DIAGNOSIS — R69 Illness, unspecified: Secondary | ICD-10-CM | POA: Diagnosis not present

## 2021-01-19 DIAGNOSIS — N939 Abnormal uterine and vaginal bleeding, unspecified: Secondary | ICD-10-CM | POA: Diagnosis not present

## 2021-01-19 DIAGNOSIS — Z113 Encounter for screening for infections with a predominantly sexual mode of transmission: Secondary | ICD-10-CM | POA: Diagnosis not present

## 2021-01-25 ENCOUNTER — Other Ambulatory Visit: Payer: Self-pay | Admitting: Obstetrics & Gynecology

## 2021-01-25 ENCOUNTER — Other Ambulatory Visit (HOSPITAL_COMMUNITY): Payer: Self-pay | Admitting: Obstetrics & Gynecology

## 2021-01-25 DIAGNOSIS — N939 Abnormal uterine and vaginal bleeding, unspecified: Secondary | ICD-10-CM

## 2021-02-07 ENCOUNTER — Other Ambulatory Visit: Payer: Self-pay | Admitting: Obstetrics & Gynecology

## 2021-02-07 DIAGNOSIS — C541 Malignant neoplasm of endometrium: Secondary | ICD-10-CM | POA: Diagnosis not present

## 2021-02-07 DIAGNOSIS — D251 Intramural leiomyoma of uterus: Secondary | ICD-10-CM | POA: Diagnosis not present

## 2021-02-07 DIAGNOSIS — Z124 Encounter for screening for malignant neoplasm of cervix: Secondary | ICD-10-CM | POA: Diagnosis not present

## 2021-02-07 DIAGNOSIS — N950 Postmenopausal bleeding: Secondary | ICD-10-CM | POA: Diagnosis not present

## 2021-02-07 HISTORY — PX: ENDOMETRIAL BIOPSY: SHX622

## 2021-02-09 DIAGNOSIS — Z0001 Encounter for general adult medical examination with abnormal findings: Secondary | ICD-10-CM | POA: Diagnosis not present

## 2021-02-09 DIAGNOSIS — Z8616 Personal history of COVID-19: Secondary | ICD-10-CM | POA: Diagnosis not present

## 2021-02-09 DIAGNOSIS — Z1331 Encounter for screening for depression: Secondary | ICD-10-CM | POA: Diagnosis not present

## 2021-02-15 ENCOUNTER — Telehealth: Payer: Self-pay

## 2021-02-15 ENCOUNTER — Other Ambulatory Visit: Payer: Self-pay

## 2021-02-15 ENCOUNTER — Ambulatory Visit: Payer: Medicare HMO | Admitting: Obstetrics and Gynecology

## 2021-02-15 ENCOUNTER — Encounter: Payer: Self-pay | Admitting: Obstetrics and Gynecology

## 2021-02-15 VITALS — BP 130/84 | HR 70 | Ht 66.5 in | Wt 216.0 lb

## 2021-02-15 DIAGNOSIS — C55 Malignant neoplasm of uterus, part unspecified: Secondary | ICD-10-CM

## 2021-02-15 NOTE — Progress Notes (Signed)
GYNECOLOGY  VISIT   HPI: 66 y.o.   Widowed  [place]  female   [phone] with Patient's last menstrual period was 12/22/2020 (exact date).   here for  abnormal uterine bleeding and fibroids.  Referred by Dr. Junius Roads.   Bleeding daily and having cramping since December 22, 2020.   Pad change 5- 6 times per day.   Her prior LMP was September, 2022 per patient.  She has only skipped 2 - 3 months at a time with respect to frequency of her cycles.  She states she never stopped having her cycles in her 28s.   Patient sees Dr. Alesia Richards at [facility]. She had a sonogram in February 07, 2021, and she was told she had a fibroid.  Had EMB done and patient received the pathology report, but does not understand what it means.  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  She is currently taking Provera to control her bleeding.  She has an appointment tomorrow to talk with Dr. Alesia Richards.   GYNECOLOGIC HISTORY: Patient's last menstrual period was 12/22/2020 (exact date). Contraception:  none Menopausal hormone therapy:  Provera 10 mg Last mammogram:  within last Year with [facility]--normal Last pap smear: 02-07-21 pend. No abnormals        OB History     Gravida  5   Para  4   Term      Preterm      AB  1   Living  4      SAB      IAB  1   Ectopic      Multiple      Live Births                 Patient Active Problem List   Diagnosis Date Noted   GERD (gastroesophageal reflux disease) 06/07/2012   Fibroids 02/26/2012   Prediabetes 08/04/2011   Abnormal glucose measurement 08/04/2011   Obesity 06/01/2011   Vitamin D deficiency 12/27/2009   ALLERGIC RHINITIS 04/23/2009   PREPATELLAR BURSITIS 01/01/2009   ANEMIA-IRON DEFICIENCY 12/22/2008   NARCOLEPSY CONDS CLASS ELSW WITHOUT CATAPLEXY 12/22/2008   TINNITUS 12/22/2008   ACUTE BRONCHITIS 12/22/2008    Perimenopause 09/16/2001    Past Medical History:  Diagnosis Date   ALLERGIC RHINITIS 04/23/2009   ANEMIA-IRON DEFICIENCY 10/09/01   ASCUS (atypical squamous cells of undetermined significance) on Pap smear 10/1999   BMI 37.0-37.9,adult 02/15/10   BV (bacterial vaginosis) 06/2007   Depression    Dysuria 2004   Endometrial polyp 12/23/09   Fatigue    Fibroid 2004   H/O abdominal pain 03/2007   H/O dysmenorrhea 10/09/01   H/O menorrhagia 2002   H/O vaginal discharge 10/09/01   R/O std   H/O vaginitis 06/17/02   H/O: obesity 12/20/09   Herpes genitalia 09/2001   Hx: UTI (urinary tract infection) 11/2007   Irregular menses 02/15/10   Irregular periods/menstrual cycles 6/09   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     diclofenac Sodium (VOLTAREN) 1 % GEL APPLY 2 GRAMS TO AFFECTED AREA 4 TIMES A DAY 50 g 0   latanoprost (XALATAN) 0.005 % ophthalmic solution 1 drop at bedtime.     medroxyPROGESTERone (PROVERA) 10 MG tablet Take by mouth.     NAPROXEN PO Take by mouth as needed.     timolol (TIMOPTIC) 0.25 % ophthalmic solution Apply to eye.     timolol (TIMOPTIC) 0.5 % ophthalmic solution timolol maleate 0.5 % eye drops  INSTILL 1 DROP IN BOTH EYES EVERY MORNING     valACYclovir (VALTREX) 500 MG tablet Take 500 mg by mouth as needed.      No current facility-administered medications for this visit.     ALLERGIES: Codeine sulfate and Latex  Family History  Problem Relation Age of Onset    Cancer Other        stomach, kidney   Diabetes Mother    Hypertension Mother    Alzheimer's disease Mother    Heart disease Father        CHF   Hypertension Father    Heart disease Maternal Aunt    Cancer Maternal Uncle     Social History   Socioeconomic History   Marital status: Widowed    Spouse name: Not on file   Number of children: Not on file   Years of education: Not on file   Highest education level: Not on file  Occupational History   Not on file  Tobacco Use   Smoking status: Former    Packs/day: 0.50    Years: 25.00    Pack years: 12.50    Types: Cigarettes    Quit date: 04/25/1999    Years since quitting: 21.8   Smokeless tobacco: Never  Vaping Use   Vaping Use: Never used  Substance and Sexual Activity   Alcohol use: Yes    Alcohol/week: 1.0 standard drink    Types: 1 Glasses of wine per week    Comment: occ   Drug use: No   Sexual activity: Yes  Other Topics Concern   Not on file  Social History Narrative   Not on file   Social Determinants of Health   Financial Resource Strain: Not on file  Food Insecurity: Not on file  Transportation Needs: Not on file  Physical Activity: Not on file  Stress: Not on file  Social Connections: Not on file  Intimate Partner Violence: Not on file    Review of Systems  All other systems reviewed and are negative.  PHYSICAL EXAMINATION:    BP 130/84    Pulse 70    Ht 5' 6.5" (1.689 m)    Wt 216 lb (98 kg)    LMP 12/22/2020 (Exact Date)    SpO2 99%    BMI 34.34 kg/m     General appearance: alert, cooperative and appears stated age Lungs: clear to auscultation bilaterally Heart: regular rate and rhythm Abdomen: soft, non-tender, mass to 3 cm above the umbilicus, mobile, nontender. No abnormal inguinal nodes palpated  Pelvic: External genitalia:  no lesions              Urethra:  normal appearing urethra with no masses, tenderness or lesions              Bartholins and Skenes: normal                 Vagina:  normal appearing vagina with normal color and discharge, no lesions              Cervix: no lesions                Bimanual Exam:  Uterus:  23 week size, mobile.               Adnexa: no mass, fullness, tenderness.   Difficult to palpate separately from the uterus.              Chaperone was present for exam:  Estill Bamberg, CMA  ASSESSMENT  Uterine malignancy, poorly differentiated.  23 week size uterus.  PLAN  Patient and I discussed her endometrial biopsy result and her large uterine mass.  She indicates understanding that she has a uterine cancer.  We discussed that she would need consultation from a gynecologic oncologist to define her treatment for this, which is expected to include hysterectomy with removal of her tubes and ovaries, and staging biopsies.  She is aware that she can pursue this care at the [facility] through Dr. Valarie Cones or that she may go to a university of choice in the region.  I recommended she bring a support person with her to the consultation. She will follow up with Dr. Alesia Richards.  I will also reach out to the patient's gynecologist so that she knows her patient was seen by me today.  I extended my support to the patient if she needs anything further.    An After Visit Summary was printed and given to the patient.

## 2021-02-15 NOTE — Telephone Encounter (Signed)
The gynecologic oncologist I discussed with patient is Dr. Valarie Cones at the [facility].   Today, I tried to contact the patient's primary gynecologist, Dr. Alesia Richards, at [facility]. I was not able to reach her to provider her with information that her patient received a diagnosis of uterine cancer with my consultation today.

## 2021-02-15 NOTE — Telephone Encounter (Signed)
Patient said at office visit today that Dr. Quincy Simmonds recommended an oncologist for her to see. She cannot recall the name.

## 2021-02-16 DIAGNOSIS — C541 Malignant neoplasm of endometrium: Secondary | ICD-10-CM

## 2021-02-16 NOTE — Telephone Encounter (Signed)
I called Dr. Fonnie Jarvis office today and left a message on her CMA's voice mail regarding my visit with her patient yesterday.   It is not possible to reach a provider directly through the office.   You may close this encounter.

## 2021-02-16 NOTE — Telephone Encounter (Signed)
I spoke with patient and informed her.  Patient said she did speak with Dr. Alesia Richards yesterday about her visit here and did tell her Dr. Quincy Simmonds would be calling. I explained Dr. Quincy Simmonds called but was unable to reach her provider.

## 2021-02-17 ENCOUNTER — Telehealth: Payer: Self-pay | Admitting: *Deleted

## 2021-02-17 NOTE — Telephone Encounter (Signed)
Spoke with the patient and scheduled a new patient appt with Dr Berline Lopes on 2/9 at 3:15 pm. Patient given the address and phone number for the clinic; along with the policy for mask and visitors

## 2021-02-18 ENCOUNTER — Telehealth: Payer: PRIVATE HEALTH INSURANCE

## 2021-02-19 NOTE — Telephone Encounter
Appointment Accommodation Request      Appointment Type: New     Reason for sooner request: Per pt, received uterine cancer diagnosis 1/31, and would like to be seen sooner by Dr. Raymon Mutton if possible.     Date/Time Requested (If any): Per pt, any day/time.    Last seen by MD: NP    Any Symptoms:  []  Yes  [x]  No       If yes, what symptoms are you experiencing:   o Duration of symptoms (how long):     Patient or caller was offered an appointment but declined.    Patient or caller was advised to seek emergency services if conditions are urgent or emergent.    Patient or caller has been notified of the turnaround time of 1-2 business (days).

## 2021-02-21 ENCOUNTER — Telehealth: Payer: PRIVATE HEALTH INSURANCE

## 2021-02-21 NOTE — Telephone Encounter
Call Back Request      Reason for call back: Per Pt, received uterine cancer diagnosis on 1/31 and has an appt on 03/28/2021 with Dr. Raymon Mutton, but Pt wants to know if Dr. Velta Addison can offer a sooner appt.     Any Symptoms:  []  Yes  [x]  No       If yes, what symptoms are you experiencing:    o Duration of symptoms (how long):    o Have you taken medication for symptoms (OTC or Rx):      If call was taken outside of clinic hours:    [] Patient or caller has been notified that this message was sent outside of normal clinic hours.     [] Patient or caller has been warm transferred to the physician's answering service. If applicable, patient or caller informed to please call us back if symptoms progress.  Patient or caller has been notified of the turnaround time of 1-2 business day(s).

## 2021-02-22 ENCOUNTER — Encounter: Payer: Self-pay | Admitting: Gynecologic Oncology

## 2021-02-24 ENCOUNTER — Inpatient Hospital Stay: Payer: Medicare HMO | Attending: Gynecologic Oncology | Admitting: Gynecologic Oncology

## 2021-02-24 ENCOUNTER — Other Ambulatory Visit: Payer: Self-pay

## 2021-02-24 ENCOUNTER — Encounter: Payer: Self-pay | Admitting: Gynecologic Oncology

## 2021-02-24 VITALS — BP 127/62 | HR 87 | Temp 98.2°F | Resp 16 | Ht 67.72 in | Wt 212.0 lb

## 2021-02-24 DIAGNOSIS — C541 Malignant neoplasm of endometrium: Secondary | ICD-10-CM

## 2021-02-24 DIAGNOSIS — Z6832 Body mass index (BMI) 32.0-32.9, adult: Secondary | ICD-10-CM | POA: Diagnosis not present

## 2021-02-24 DIAGNOSIS — E669 Obesity, unspecified: Secondary | ICD-10-CM | POA: Insufficient documentation

## 2021-02-24 DIAGNOSIS — E559 Vitamin D deficiency, unspecified: Secondary | ICD-10-CM | POA: Insufficient documentation

## 2021-02-24 DIAGNOSIS — G47419 Narcolepsy without cataplexy: Secondary | ICD-10-CM | POA: Insufficient documentation

## 2021-02-24 DIAGNOSIS — Z79899 Other long term (current) drug therapy: Secondary | ICD-10-CM | POA: Insufficient documentation

## 2021-02-24 DIAGNOSIS — D259 Leiomyoma of uterus, unspecified: Secondary | ICD-10-CM | POA: Diagnosis not present

## 2021-02-24 DIAGNOSIS — H409 Unspecified glaucoma: Secondary | ICD-10-CM | POA: Insufficient documentation

## 2021-02-24 NOTE — Patient Instructions (Signed)
It was a pleasure meeting you today. Please don't hesitate to reach out if I can help with anything moving forward. Our clinic number is [phone].

## 2021-02-24 NOTE — Progress Notes (Signed)
GYNECOLOGIC ONCOLOGY NEW PATIENT CONSULTATION   Patient Name: Mary Romero  Patient Age: 66 y.o. 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  I discussed with the patient and her daughter (who joined by phone) recent ultrasound (I only have the report, am unable to see images) as well as biopsy. While she has had a long history of uterine fibroids, recent change in bleeding pattern prompted EMB showing high grade uterine cancer. I reviewed suspected diagnosis of serous carcinoma although noted that there is some difficulty in interpretation and so definitive diagnosis will be deferred until more tissue is available.  I noticed in [place] that the patient has an appointment with a gyn oncologist at [facility] in March. I asked her about this. She is worried about surgical recovery and cancer treatment here in [place] given her relative lack of social/family support. Given her social support here (one son, lives alone) and after discussing with her children, she has made the decision to relocate to [place] where one daughter lives (and the other will soon be moving).  She has a ticket for Monday and was able to reschedule her gyn onc appointment for next Tuesday.   We discussed typical treatment of endometrial cancer. In the setting of a high grade carcinoma, I recommend CT imaging to assess for metastatic disease. While we discussed the possibility of getting imaging here, I think it will be difficult to schedule a CT in the next 4 days. Ideally, she would have this imaging where her treatment will take place.   Pending imaging, we discussed initial treatment would include surgical staging, including total hysterectomy, bilateral salpingo-oophorectomy, lymph node assessment, possible tumor debulking. Decisions about  additional procedures would be made at the time of the patient's surgery and any intra-operative pathology review if undertaken.   She asked about minimally invasive surgery. Given size of her fibroid uterus, she will require a laparotomy for specimen removal.   We discussed that she will likely require adjuvant treatment, but the details of what that will entail will be dictated by her final pathology and stage.  The patient was given copies of her ultrasound report and pathology report to take with her for the appointment in [place] next week.   I offered to be available for any part of her care moving forward.   A copy of this note was sent to the patient's referring provider.   55 minutes of total time was spent for this patient encounter, including preparation, face-to-face counseling with the patient and coordination of care, and documentation of the encounter.   Jeral Pinch, MD  Division of Gynecologic Oncology  Department of Obstetrics and Gynecology  [facility]  ___________________________________________  Chief Complaint: Chief Complaint  Patient presents with   Adenocarcinoma of endometrium [facility])    History of Present Illness:  Mary Romero is a 66 y.o. y.o. female who is seen in consultation at the request of Dr. Alesia Richards for an evaluation of newly diagnosed uterine cancer.  Patient denies ever truly having menses stopped.  She began taking oral estrogen for vaginal dryness about 10 or 12 years ago.  She would not take this daily but cyclically.  She was not on any progesterone to oppose the estrogen during that time.  For those 10 or 12 years, she would have menstrual-like bleeding every 1  to 3 months that would last for several days.  She has a longstanding history of uterine fibroids.  She was followed by Dr. Raphael Gibney for some time with stable appearance of her fibroids.  She was seen by my prior partner, Dr. Denman George, in 2017 in the setting of  abnormal uterine bleeding.  An [place] was recommended at that visit to better evaluate her menopause status and an endometrial biopsy was performed.  FSH and LH were in menopausal range and her endometrial biopsy showed inactive endometrium with squamous metaplasia.  Most recently, starting on December 7, the patient notes steady and daily bleeding.  She uses 2 super pads at a time which she describes as thin.  She changes them every couple of hours and they are mostly saturated.  She sometimes will get a little bit of blood on her underwear but denies soiling her clothing.  She has some occasional passage of tiny clots, denies larger clot passage.  Endorses some pain and cramping that she describes as being menstrual-like and not daily.  Patient saw her OB/GYN on 1/23.  Provera was given due to bleeding at that time.  Patient underwent pelvic ultrasound exam on 1/23 at [facility].  This showed an anteverted uterus measuring 13 cm with endometrium difficult to evaluate given large fibroid.  A large fibroid measuring 13.2 x 11.6 x 14.4 cm extending through entire uterus, possible intramural, is noted.  There is some fluid within the lower uterine segment along the internal cervical os.  Bilateral ovaries are suboptimally seen.  No free fluid noted.  Endometrial biopsy on that same day returned showing poorly differentiated high-grade malignancy.  IHC shows tumor cells are positive for p16, PAX8, patchy staining for cyclin D1, CK8 and CD56.  IHC for cytokeratin AE1/AE3, ER and CD99 are negative.  P53 shows a clonal null expression pattern.  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  She endorses a good appetite without nausea or emesis.  She denies any recent weight changes.  She reports normal bowel and bladder function.  Family history is notable for multiple family members on her father side of the family  including uncles and aunts with kidney, stomach, liver, and brain cancer.  She has a paternal cousin with pancreatic cancer as well as a maternal cousin with pancreatic cancer.  Denies any GYN or breast cancer in her family.  Patient lives alone in [place].  She has 1 son who is here in [place], 1 son in [place], 1 daughter in [place], and another daughter moving to [place] in April.  PAST MEDICAL HISTORY:  Past Medical History:  Diagnosis Date   ALLERGIC RHINITIS 04/23/2009   ANEMIA-IRON DEFICIENCY 10/09/2001   ASCUS (atypical squamous cells of undetermined significance) on Pap smear 10/17/1999   BMI 37.0-37.9,adult 02/15/2010   BV (bacterial vaginosis) 06/17/2007   Depression    Dysuria 01/16/2002   Endometrial polyp 12/23/2009   Fatigue    Fibroid 01/16/2002   Glaucoma    H/O abdominal pain 03/17/2007   H/O dysmenorrhea 10/09/2001   H/O menorrhagia 01/17/2000   H/O vaginal discharge 10/09/2001   R/O std   H/O vaginitis 06/17/2002   H/O: obesity 12/20/2009   Herpes genitalia 09/16/2001   Hx: UTI (urinary tract infection) 11/17/2007   Irregular menses 02/15/2010   Irregular periods/menstrual cycles 06/17/2007   NARCOLEPSY CONDS CLASS ELSW WITHOUT CATAPLEXY 12/22/2008   Perimenopause 09/16/2001   Post-menopausal bleeding 05/17/2007  PRB (rectal bleeding) 04/17/2007   TINNITUS 12/22/2008   VITAMIN D DEFICIENCY 12/27/2009     PAST SURGICAL HISTORY:  Past Surgical History:  Procedure Laterality Date   DILATION AND CURETTAGE OF UTERUS  02/15/2010   ENDOMETRIAL BIOPSY  02/07/2021   EYE SURGERY  01/17/2004   X 2   HYSTEROSCOPY  02/15/2010    OB/GYN HISTORY:  OB History  Gravida Para Term Preterm AB Living  5 4     1 4   SAB IAB Ectopic Multiple Live Births    1          # Outcome Date GA Lbr Len/2nd Weight Sex Delivery Anes PTL Lv  5 IAB           4 Para           3 Para           2 Para           1 Para             No LMP recorded.  Age at  menarche: 36 Age at menopause: Likely in her mid 75s based on prior hormone testing here in 2017 Hx of HRT: Was on oral unopposed estrogen intermittently for 10/12 years Hx of STDs: Yes, has history of herpes Last pap: 01/2018 -negative: Had a Pap smear on 02/07/2021 showing adenocarcinoma, favor endometrial origin, high risk HPV negative History of abnormal pap smears: Denies  SCREENING STUDIES:  Last mammogram: 2021  Last colonoscopy: 2019 Last bone mineral density: Has not had  MEDICATIONS: Outpatient Encounter Medications as of 02/24/2021  Medication Sig   Cholecalciferol (VITAMIN D) 2000 units tablet Take 2,000 Units by mouth daily.   diclofenac Sodium (VOLTAREN) 1 % GEL APPLY 2 GRAMS TO AFFECTED AREA 4 TIMES A DAY   ferrous sulfate 324 MG TBEC Take 65 mg by mouth.   latanoprost (XALATAN) 0.005 % ophthalmic solution 1 drop at bedtime.   NAPROXEN PO Take by mouth as needed.   timolol (TIMOPTIC) 0.25 % ophthalmic solution Apply to eye.   valACYclovir (VALTREX) 500 MG tablet Take 500 mg by mouth as needed.    medroxyPROGESTERone (PROVERA) 10 MG tablet Take by mouth. (Patient not taking: Reported on 02/22/2021)   timolol (TIMOPTIC) 0.5 % ophthalmic solution timolol maleate 0.5 % eye drops  INSTILL 1 DROP IN BOTH EYES EVERY MORNING (Patient not taking: Reported on 02/22/2021)   No facility-administered encounter medications on file as of 02/24/2021.    ALLERGIES:  Allergies  Allergen Reactions   Codeine Sulfate     REACTION: "out of body experience"   Latex Hives     FAMILY HISTORY:  Family History  Problem Relation Age of Onset   Diabetes Mother    Hypertension Mother    Alzheimer's disease Mother    Heart disease Father        CHF   Hypertension Father    Heart disease Maternal Aunt    Cancer Maternal Uncle    Cancer Other        stomach, kidney   Pancreatic cancer Cousin    Colon cancer Neg Hx    Breast cancer Neg Hx    Ovarian cancer Neg Hx    Endometrial cancer Neg Hx     Prostate cancer Neg Hx      SOCIAL HISTORY:  Social Connections: Not on file    REVIEW OF SYSTEMS:  Pertinent positives include abdominal pain, blood in urine, pelvic pain, back pain. Denies appetite  changes, fevers, chills, fatigue, unexplained weight changes. Denies hearing loss, neck lumps or masses, mouth sores, ringing in ears or voice changes. Denies cough or wheezing.  Denies shortness of breath. Denies chest pain or palpitations. Denies leg swelling. Denies abdominal distention, pain, blood in stools, constipation, diarrhea, nausea, vomiting, or early satiety. Denies pain with intercourse, dysuria, frequency, or incontinence. Denies hot flashes or vaginal discharge.   Denies joint pain, or muscle pain/cramps. Denies itching, rash, or wounds. Denies dizziness, headaches, numbness or seizures. Denies swollen lymph nodes or glands, denies easy bruising or bleeding. Denies anxiety, depression, confusion, or decreased concentration.  Physical Exam:  Vital Signs for this encounter:  Blood pressure 127/62, pulse 87, temperature 98.2 F (36.8 C), temperature source Oral, resp. rate 16, height 5' 7.72" (1.72 m), weight 212 lb (96.2 kg), SpO2 100 %. Body mass index is 32.5 kg/m. General: Alert, oriented, no acute distress.  HEENT: Normocephalic, atraumatic. Sclera anicteric.  Chest: Unlabored breathing on room air.  LABORATORY AND RADIOLOGIC DATA:  Outside medical records were reviewed to synthesize the above history, along with the history and physical obtained during the visit.   Lab Results  Component Value Date   WBC 4.7 01/03/2019   HGB 12.6 01/03/2019   HCT 38.0 01/03/2019   PLT 212.0 01/03/2019   GLUCOSE 111 (H) 01/03/2019   CHOL 185 08/29/2019   TRIG 50 08/29/2019   HDL 58 08/29/2019   LDLCALC 114 (H) 08/29/2019   ALT 14 01/03/2019   AST 16 01/03/2019   NA 141 01/03/2019   K 3.9 01/03/2019   CL 105 01/03/2019   CREATININE 0.61 01/03/2019   BUN 15  01/03/2019   CO2 29 01/03/2019   TSH 1.96 01/03/2019   HGBA1C 5.5 01/03/2019

## 2021-02-25 NOTE — Telephone Encounter
Spoke to patient and notified her that CT scan will be ordered at the time of her appointment.

## 2021-02-25 NOTE — Telephone Encounter
Call Back Request      Reason for call back: Pt coming from the [facility] to see Dr. Raymon Mutton on Tuesday, Feb 14th for consult and she was advised by her oncologist to contact the office to ask if a CT scan be scheduled before she arrives.      Any Symptoms:  []  Yes  [x]  No       If yes, what symptoms are you experiencing:    o Duration of symptoms (how long):    o Have you taken medication for symptoms (OTC or Rx):      If call was taken outside of clinic hours:    [] Patient or caller has been notified that this message was sent outside of normal clinic hours.     [] Patient or caller has been warm transferred to the physician's answering service. If applicable, patient or caller informed to please call us back if symptoms progress.  Patient or caller has been notified of the turnaround time of 1-2 business day(s).

## 2021-02-28 ENCOUNTER — Telehealth: Payer: PRIVATE HEALTH INSURANCE

## 2021-02-28 NOTE — Consults
PATIENT: Alisha Orr  MRN: [id number]  DOB: [birth year]  DATE OF SERVICE: 03/01/2021    REFERRING PRACTITIONER: Referral, Self  PRIMARY CARE PROVIDER: No primary care provider on file.          Subjective:      History of Present Illness:  Alisha Orr is a 66 y.o. G21P4 female with history of uterine fibroids who presents for newly diagnosed endometrial adenocarcinoma. I am asked by Dr. Hoover Browns to evaluate Alisha Orr in consultation today. Pertinent information and medical history was obtained from the patient and additional history was available from review of the medical records.     - 2017: Consultation with Dr. Andrey Farmer for AUB. FSH and LH were in menopausal range and her endometrial biopsy showed inactive endometrium with squamous metaplasia.    Was on unopposed estrogen for 10 years.     - 12/2020: Vaginal bleeding.   - 02/07/2021: Still experiencing vaginal bleeding.     On exam, enlarged 20 week size uterus.      Pelvic US: Endometrium difficult to evaluate due to large fibroid (13 x 12 x 14 cm). Large fibroid visualized extending throughout entire uterus, possible intramural. There appears to be some fluid with the LUS along internal cervical os, wrapping along side the fibroid.     Pap smear revealed adenocarcinoma, favoring endometrial origin. HPV negative.     EMB showed poorly differentiated high-grade malignancy.  IHC shows tumor cells are positive for p16, PAX8, patchy staining for cyclin D1, CK8 and CD56. IHC for cytokeratin AE1/AE3, ER and CD99 are negative. P53 shows a clonal null expression pattern. 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    Pap smear with adenocarcinoma. HPV negative.    Rx medroxyprogesterone 20 mg TID x 7 days -> 1x/day x 7 days. No longer on hormones.    Still experiencing vaginal bleeding. Taking Hiram Gash Oil for pain.     Family history is notable for multiple family members on her father side of the family including uncles and aunts with kidney, stomach, liver, and brain cancer. She has a paternal cousin with pancreatic cancer as well as a maternal cousin with pancreatic cancer. No one in the family has had genetic testing done.     Here with daughter. Patient is from [place]. Here for treatment. Daughter lives in [place].     Patient is a Conservation officer, nature.     History of HSC D&C 01/2010.     OB/GYN history: Menstrual History:   OB History   No obstetric history on file.        No past medical history on file.  No past surgical history on file.  Outpatient Medications Prior to Visit   Medication Sig   ? ferrous sulfate 324 MG TBEC Take 1 tablet (324 mg total) by mouth.   ? latanoprost 0.005% ophthalmic solution INSTILL 1 DROP INTO BOTH EYES AT BEDTIME   ? naproxen 500 mg tablet naproxen 500 mg tablet   TAKE 1 TABLET BY MOUTH TWICE A DAY AS NEEDED   ? timolol 0.5% ophthalmic solution timolol maleate 0.5 % eye drops   INSTILL 1 DROP IN BOTH EYES EVERY MORNING     No facility-administered medications prior to visit.     Allergies   Allergen Reactions   ? Codeine      No family history on file.  Social History     Socioeconomic History   ?  Marital status: Unknown   Tobacco Use   ? Smoking status: Former     Types: Cigarettes   ? Smokeless tobacco: Never       Review of Systems: + vaginal bleeding. A 14-system review of systems was performed and is negative except as stated in the history of present illness.    Objective:     Physical Exam:  BP 135/80  ~ Pulse 86  ~ Temp 36.2 ?C (97.1 ?F) (Forehead)  ~ Ht 5' 7'' (1.702 m)     A medical chaperone was present for all portions of the exam/procedure: Alisha Orr LVN.    Gen: AAOx3; NAD  Bilateral breasts: Normal, non-tender, no masses, no breast discharge  Abdomen: fibroid uterus palpated 5 mm above umbilicus, non-tender  No groin adenopathy  Normal female external genitalia. Normal cervix. No tumor on cervix or in the vagina.  On pelvic exam, palpated large, distorted uterus, about 20 cm in size.  On rectovaginal exam, septum is free. Nothing obvious in the parametrium.     Pathology:  02/07/2021: Pap smear with HPV co-testing      02/07/2021: EMB      Radiology:  02/07/2021: Pelvic Ultrasound      Assessment:   Alisha Orr is a 66 y.o. female with newly diagnosed endometrial adenocarcinoma based on outside work-up.      Plan/ Recommendation:   1) Reviewed outside 01/2021 pap showed adenocarcinoma (favoring endometrial origin) and HPV co-testing was negative.   2) Patient signed STAT requisition form to obtain outside 01/2021 pap smear and EMB.   3) Diagnosis of endometrial adenocarcinoma reviewed. Recommendation is to have surgery, if possible. Surgery would entail removal of ovaries, uterus, tubes, biopsy of lymph nodes and omentum.  Patient understands that by removing the uterus, patient can no longer carry a pregnancy. Patient also understands the risk of blood transfusion. Risks of surgery were discussed with patient including but not limited to risk of bleeding, infection, damage to adjacent organs, wound opening, wound infection.  Discussed recovery takes about 6-8 weeks and full recovery from any major operation can take up to 6 months. Depending on findings, additional treatment might be indicated in the form of chemotherapy and/or radiation. Surgical consents were signed electronically.  We discussed if formal review of Alisha Orr differs from outside diagnosis, I will contact patient to discuss treatment plan changes.  4) Ordered pre-operative PET/CT to assess for metastatic disease. Provided scheduling number.   5) Pre-operative labs were ordered.   6) Obtain pre-operative clearance from PCP. STAT referral entrred.   7) Referred to genetic counseling/testing given personal/family history of cancer. Provided scheduling number.   8) Communication sent to [facility] surgery scheduling team to schedule surgery.  9) Patient consented to and signed research study forms.      Alisha Orr has had all questions answered satisfactorily and is in agreement with this recommended plan of care.     SCRIBE SIGNATURE(S):  I, Simeon Craft, have assisted Dr.Sanaz Memarzadeh with the documentation for Alisha Orr on 03/01/2021 at 3:42 PM.      Total time in consultation with the patient was approximately *** minutes. Greater than 50% of this time was spent in counseling and/or coordinating care for this patient.      cc No primary care provider on file.         Author: Melody Comas, MD, PhD   Professor  Division of Gynecologic Oncology  Department of Obstetrics and Gynecology  03/01/2021 3:18 PM

## 2021-02-28 NOTE — Telephone Encounter
Is patient stating this is a medical emergency?  []  Yes  [x]  No     If yes, instruct patient to call 911    Patient states Red Flag Symptoms    Symptom: Abdominal Pain - Female - Not Pregnant  Outcome: Urgent Call - Do NOT Schedule Appointment  Reason: Getting worse      Additional Details: Patient called in requesting to be seen today rather than tomorrow if possible, because she is experiencing discomfort and abdominal pain.   Call warm transferred to PDL: []  Yes  [x]  No    Call Received by Practice Representative: Advised patient to go to emergency services, because is not established. Patient states she will go to emergency services.

## 2021-03-01 ENCOUNTER — Ambulatory Visit: Payer: PRIVATE HEALTH INSURANCE

## 2021-03-01 ENCOUNTER — Non-Acute Institutional Stay: Payer: PRIVATE HEALTH INSURANCE

## 2021-03-01 ENCOUNTER — Telehealth: Payer: PRIVATE HEALTH INSURANCE

## 2021-03-01 ENCOUNTER — Inpatient Hospital Stay: Payer: PRIVATE HEALTH INSURANCE

## 2021-03-01 DIAGNOSIS — C541 Malignant neoplasm of endometrium: Secondary | ICD-10-CM

## 2021-03-01 NOTE — Telephone Encounter
PDL Call to Clinic    Reason for Call: Pt stated she was calling to confirm appointment time for today. Pt was advised of the time of her appointment and pt stated her appointment was to be at [place]. Pt was advised her previous cancelled appointment was at [facility] but rescheduled to today at 12:30 per her request for a sooner appointemnt. Pt requested to speak to someone in office about her appointment as she believes she was misinformed. PDL to office.    Appointment Related?  [x]  Yes  []  No     If yes;  Date:03/01/21  Time:9a    Call warm transferred to PDL: [x]  Yes  []  No    Call Received by [place]    If call not answered/not accepted, call received by Patient Services Representative:

## 2021-03-02 ENCOUNTER — Telehealth: Payer: PRIVATE HEALTH INSURANCE

## 2021-03-02 NOTE — Telephone Encounter
Per Dr. Raymon Mutton, requested slides from [facility] and provided Fed Ex number to mailed to [place].

## 2021-03-02 NOTE — Telephone Encounter
Diagnosis- C54.1 (ICD-10-CM) - Endometrial cancer (HCC/RAF)/Consult for intravenous iron and genetic testing    [x] - Newly Diagnosed    [] - Second opinion  [] - Continuation of care     Type of insurance/Is auth needed? If so, status of auth? [employer] MCR Assign PPO/No auth needed     Date/Time of when appt was scheduled: TBD     Referring MD/ph. Number: Dr. Otis Peak Memarzadeh/CBN: [phone]     Status of medical records   [] - Care Connect  [] - Epic care everywhere   [x] - Outside facility/ office/ Hospital- Patient has been provided our fax number and medical records will be faxed     Where new patient paperwork is supposed to be sent  [] - Office   [x] - Mailed to home      Any other pertinent information: The patients daughter stated that the patient is being referred for a consutl for  intravenous iron and genetic testing   CBN: [phone]    Thank You

## 2021-03-02 NOTE — Telephone Encounter
I called pt back and left msg on her vm that we are reaching out to her bec of the referral with gen consult.  Also mentioned that pt has [employer] [place] [employer] MCR Assign that does not cover the gen consult and pt has to pay $340 out-of-pocket or she can go to [employer] [phone] w/o paying $340.  Asked pt to call us back if she has any questions.    Thank you!

## 2021-03-02 NOTE — Telephone Encounter
HELLO      NEW PATIENT APPT SCHEDULED.   DATE AND TIME OF APPT: 03/21/2021   PHYSICIAN DIAGNOSIS: ENDOMETRIAL CANCER    [place] Patient :Alisha Orr   PLEASE SEND NEW PATIENT PACKET.   EMAIL: [email or web address]     [place]

## 2021-03-02 NOTE — Telephone Encounter
Call Back Request      Reason for call back:  Patient is requesting a call back regarding referral to Hematology & Oncology. She was unable to schedule she was told there was an error with the referral. Please call patient when it is corrected.     Any Symptoms:  []  Yes  [x]  No       If yes, what symptoms are you experiencing:    o Duration of symptoms (how long):    o Have you taken medication for symptoms (OTC or Rx):      If call was taken outside of clinic hours:    [] Patient or caller has been notified that this message was sent outside of normal clinic hours.     [] Patient or caller has been warm transferred to the physician's answering service. If applicable, patient or caller informed to please call us back if symptoms progress.  Patient or caller has been notified of the turnaround time of 1-2 business day(s).

## 2021-03-02 NOTE — Telephone Encounter
Message to Practice/Provider      Message: Pt is returning missed call from the office. No voice message left.    Return call is not being requested by the patient or caller.    Patient or caller has been notified of the turnaround time of 1-2 business day(s).

## 2021-03-02 NOTE — Telephone Encounter
-----   Message from Jeanie Cooks, MD, PhD sent at 03/01/2021  3:52 PM PST -----  ATTN: ob/gyn surgery - urgent high grade uterine cancer    Location: [facility]   Day/Time: Thursday, Monday afternoon   Procedure: imed  Case Length: 4 hours   Anesthesia: GET, epidural  Diagnosis: high grade endometrial cancer  Class: admit  Special Needs: Spiphy    PMD: yes- needs one at [facility]  PEPC: yes  LABS: ordered  Time off: not working

## 2021-03-03 ENCOUNTER — Telehealth: Payer: PRIVATE HEALTH INSURANCE

## 2021-03-03 ENCOUNTER — Ambulatory Visit: Payer: PRIVATE HEALTH INSURANCE

## 2021-03-03 ENCOUNTER — Non-Acute Institutional Stay: Payer: PRIVATE HEALTH INSURANCE

## 2021-03-03 MED ORDER — AMOXICILLIN 500 MG PO CAPS
500 mg | ORAL_CAPSULE | Freq: Three times a day (TID) | ORAL | 0 refills | Status: AC
Start: 2021-03-03 — End: 2021-03-07

## 2021-03-03 NOTE — Telephone Encounter
-----   Message from Jeanie Cooks, MD, PhD sent at 03/01/2021  3:57 PM PST -----  I will also will need outside slides reviewed. Biopsy done on 01/28/2021. Thank you  ----- Message -----  From: Jeanie Cooks, MD, PhD  Sent: 03/01/2021   3:54 PM PST  To: Jeanie Cooks, MD, PhD,  Guerry Bruin, #    ATTN: ob/gyn surgery - urgent high grade uterine cancer    Location: [facility]   Day/Time: Thursday, Monday afternoon   Procedure: imed  Case Length: 4 hours   Anesthesia: GET, epidural  Diagnosis: high grade endometrial cancer  Class: admit  Special Needs: Spiphy    PMD: yes- needs one at [facility]  PEPC: yes  LABS: ordered  Time off: not working

## 2021-03-03 NOTE — Patient Instructions
[facility] Discharge Instructions    Thank you for visiting the Preoperative [facility] and [facility] today. An anesthesiologist is a doctor who provides care to patients like you during surgery. During your visit today, your medical condition and medications have been carefully reviewed by one of our anesthesiologists. Your anesthesiologist asks that you follow the following instructions pertaining to your upcoming procedure:    Medications:    Please DO TAKE the following medications before your surgery or procedure:    Please continue to take your scheduled medications as prescribed    You may use enough water to comfortably swallow any pills on the morning of surgery.    Please DO NOT TAKE the following medications before your surgery or procedure:    Not applicable    Hydration:  It is also important to remain well hydrated before surgery. Please drink clear liquids up until 2 hours prior to your arrival time at [facility]. Clear liquids include any liquid you can see completely through: water, pedialyte, gatorade, black coffee, tea no milk.  No solid foods after 12AM the night before your surgery    Diagnostic Studies:  N/A    Consultations:  N/A    Smoking:  N/A    Nutrition:  N/A    Exercise:  It is important that you remain as active as possible prior to your procedure.     Other Instructions:  Please make sure to follow up with your PCP appointment Sunday 02/19 for your PreOp and EKG.  Please follow the ''Important Instructions Regarding Your Surgery'' that was given to you in your surgeon's office and given to you today.

## 2021-03-03 NOTE — Telephone Encounter
PDL Call to Clinic    Reason for Call: Pt calling has some questions for surgery regarding pet scan she will be having on 2/23    Appointment Related?  [x]  Yes  []  No     If yes;  Date:2/23  Time:    Call warm transferred to PDL: []  Yes  [x]  No    Call Received by Clinic Representative: Transferred no answer     If call not answered/not accepted, call received by Patient Services Representative:

## 2021-03-03 NOTE — Telephone Encounter
Reached out to Pinole at [place] [phone] to confirm if pathology slide request has been received and processed. LVM w [facility].

## 2021-03-04 NOTE — Telephone Encounter
Call Back Request      Reason for call back:  Pt states she needs to have a CT scan prior surgery, but they are trying to schedule her on the same day as surgery.So she is not if this is something that can be done on the same day. Please give Pt a call to advise on how to proceed, thank you.     Any Symptoms:  []  Yes  [x]  No       If yes, what symptoms are you experiencing:    o Duration of symptoms (how long):    o Have you taken medication for symptoms (OTC or Rx):      If call was taken outside of clinic hours:    [] Patient or caller has been notified that this message was sent outside of normal clinic hours.     [] Patient or caller has been warm transferred to the physician's answering service. If applicable, patient or caller informed to please call us back if symptoms progress.  Patient or caller has been notified of the turnaround time of 1-2 business day(s).

## 2021-03-04 NOTE — Telephone Encounter
PDL Call to Clinic    Reason for Call:Patient requested to speak to John regarding (Pet) scan.    Appointment Related?  []  Yes  []  No     If yes;  Date:  Time:    Call warm transferred to PDL: [x]  Yes  []  No    Call Received by [place]  If call not answered/not accepted, call received by Patient Services Representative:

## 2021-03-04 NOTE — Telephone Encounter
PDL Call to Clinic    Reason for Call: pt requesting to speak to [facility] regarding msg below.     Appointment Related?  []  Yes  [x]  No     If yes;  Date:  Time:    Call warm transferred to PDL: [x]  Yes  []  No    Call Received by Clinic Representative: John    If call not answered/not accepted, call received by Patient Services Representative:

## 2021-03-06 ENCOUNTER — Institutional Professional Consult (permissible substitution): Payer: PRIVATE HEALTH INSURANCE

## 2021-03-06 ENCOUNTER — Inpatient Hospital Stay: Payer: PRIVATE HEALTH INSURANCE

## 2021-03-06 DIAGNOSIS — Z01818 Encounter for other preprocedural examination: Secondary | ICD-10-CM

## 2021-03-06 NOTE — Progress Notes
PATIENT: Alisha Orr  MRN: [id number]  DOB: [birth year]  DATE OF SERVICE: 03/06/2021    CHIEF COMPLAINT:   Chief Complaint   Patient presents with   ? Pre-op Exam     Hysterectomy date 03/10/21 with Dr Graylin Shiver     Uterine cancer.  TAHBSO with possible omentum removal as well.      No past medical history on file.    No past surgical history on file.    Social History     Tobacco Use   Smoking Status Former   ? Types: Cigarettes   Smokeless Tobacco Never       No family history on file.    Social History     Substance and Sexual Activity   Alcohol Use None       Social History     Substance and Sexual Activity   Drug Use Not on file       Allergies   Allergen Reactions   ? Codeine Other (See Comments)     REACTION: ''out of body experience''  REACTION: ''out of body experience''  REACTION: ''out of body experience''  REACTION: ''out of body experience''     ? Latex Hives       Patient Active Problem List   Diagnosis   ? Adenocarcinoma of endometrium (HCC/RAF)   ? Allergic rhinitis   ? BMI 32.0-32.9,adult   ? Obesity   ? Dyspareunia   ? Dysphagia   ? Disequilibrium   ? Dysphonia   ? Ear ringing   ? Endometrial cancer (HCC/RAF)   ? GERD (gastroesophageal reflux disease)   ? Hearing loss, sensorineural, asymmetrical   ? History of detached retina repair   ? Impaired hearing   ? Iron deficiency anemia, unspecified   ? Laryngopharyngeal reflux   ? Left lower quadrant pain   ? Leiomyoma of uterus, unspecified   ? 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 Menopausal symptom   ? Prediabetes   ? Secondary open-angle glaucoma   ? Temporomandibular joint disorder   ? Vitamin D deficiency         Current Outpatient Medications:   ?  ferrous sulfate 324 MG TBEC, Take 1 tablet (324 mg total) by mouth., Disp: , Rfl:   ?  latanoprost 0.005% ophthalmic solution, INSTILL 1 DROP INTO BOTH EYES AT BEDTIME, Disp: , Rfl:   ?  naproxen 500 mg tablet, naproxen 500 mg tablet  TAKE 1 TABLET BY MOUTH TWICE A DAY AS NEEDED, Disp: , Rfl:   ?  timolol 0.5% ophthalmic solution, timolol maleate 0.5 % eye drops  INSTILL 1 DROP IN BOTH EYES EVERY MORNING, Disp: , Rfl:     Health Maintenance   Topic Date Due   ? COVID-19 Vaccine(Tracks primary and booster doses, not sup/immunocomp) (1) Never done   ? Hepatitis B Screening  Never done   ? Hepatitis C Screening  Never done   ? Shingles (Shingrix) Vaccine (1 of 2) Never done   ? Breast Ca Screening: MAMMOGRAM  Never done   ? Colorectal Cancer Screening  Never done   ? Annual Preventive Wellness Visit  Never done   ? Advance Directive  Never done   ? Pneumococcal Vaccine (1 - PCV) Never done   ? Osteoporosis Early Detection DEXA Scan  Never done   ? Tdap/Td Vaccine (2 - Td or Tdap) 04/24/2020   ? Influenza Vaccine (1) 09/16/2020   ? Prediabetes Screening (See hover text)  03/01/2024  Patient Care Team:  Trena Platt., DO as PCP - General (Family Medicine)      Subjective:      Alisha Orr is a 66 y.o. female.    Review of Systems   Constitutional: Negative.  Negative for activity change, appetite change, chills, diaphoresis, fatigue, fever and unexpected weight change.   HENT: Negative.  Negative for congestion, dental problem, drooling, ear discharge, ear pain, facial swelling, hearing loss, mouth sores, nosebleeds, postnasal drip, rhinorrhea, sinus pressure, sinus pain, sneezing, sore throat, tinnitus, trouble swallowing and voice change.    Eyes: Negative.  Negative for photophobia, pain, discharge, redness, itching and visual disturbance.   Respiratory: Negative.  Negative for apnea, cough, choking, chest tightness, shortness of breath, wheezing and stridor.    Cardiovascular: Negative.  Negative for chest pain, palpitations and leg swelling.   Gastrointestinal: Negative.  Negative for abdominal distention, abdominal pain, anal bleeding, blood in stool, constipation, diarrhea, nausea, rectal pain and vomiting.   Endocrine: Negative.  Negative for cold intolerance, heat intolerance, polydipsia, polyphagia and polyuria.   Genitourinary: Negative.  Negative for decreased urine volume, difficulty urinating, dyspareunia, dysuria, enuresis, flank pain, frequency, genital sores, hematuria, menstrual problem, pelvic pain, urgency, vaginal bleeding, vaginal discharge and vaginal pain.   Musculoskeletal: Negative.  Negative for arthralgias, back pain, gait problem, joint swelling, myalgias, neck pain and neck stiffness.   Skin: Negative.  Negative for color change, pallor, rash and wound.   Allergic/Immunologic: Negative.  Negative for environmental allergies, food allergies and immunocompromised state.   Neurological: Negative.  Negative for dizziness, tremors, seizures, syncope, facial asymmetry, speech difficulty, weakness, light-headedness, numbness and headaches.   Hematological: Negative.  Negative for adenopathy. Does not bruise/bleed easily.   Psychiatric/Behavioral: Negative.  Negative for agitation, behavioral problems, confusion, decreased concentration, dysphoric mood, hallucinations, self-injury, sleep disturbance and suicidal ideas. The patient is not nervous/anxious and is not hyperactive.    All other systems reviewed and are negative.        Objective:      Physical Exam  Vitals reviewed.   Constitutional:       General: She is not in acute distress.     Appearance: Normal appearance. She is well-developed and normal weight. She is not ill-appearing, toxic-appearing or diaphoretic.      Comments: BP 109/75  ~ Pulse 98  ~ Temp 36.5 ?C (97.7 ?F) (Tympanic)  ~ Ht 5' 7'' (1.702 m)  ~ Wt 209 lb 9.6 oz (95.1 kg)  ~ SpO2 97%  ~ BMI 32.83 kg/m?      HENT:      Head: Normocephalic and atraumatic.      Right Ear: Tympanic membrane, ear canal and external ear normal. There is no impacted cerumen.      Left Ear: Tympanic membrane, ear canal and external ear normal. There is no impacted cerumen.      Nose: Nose normal. No congestion or rhinorrhea.      Mouth/Throat:      Mouth: Mucous membranes are moist.      Pharynx: Oropharynx is clear. No oropharyngeal exudate or posterior oropharyngeal erythema.   Eyes:      General: No scleral icterus.        Right eye: No discharge.         Left eye: No discharge.      Extraocular Movements: Extraocular movements intact.      Conjunctiva/sclera: Conjunctivae normal.      Pupils: Pupils are equal, round, and reactive to light.  Neck:      Thyroid: No thyromegaly.      Vascular: No carotid bruit.   Cardiovascular:      Rate and Rhythm: Normal rate and regular rhythm.      Pulses: Normal pulses.      Heart sounds: Normal heart sounds. No murmur heard.    No friction rub. No gallop.   Pulmonary:      Effort: Pulmonary effort is normal. No respiratory distress.      Breath sounds: Normal breath sounds. No stridor. No wheezing, rhonchi or rales.   Chest:      Chest wall: No tenderness.   Abdominal:      General: Abdomen is flat. Bowel sounds are normal. There is no distension.      Palpations: Abdomen is soft. There is no mass.      Tenderness: There is no abdominal tenderness. There is no right CVA tenderness, left CVA tenderness, guarding or rebound.      Hernia: No hernia is present.   Musculoskeletal:         General: No swelling, tenderness, deformity or signs of injury. Normal range of motion.      Cervical back: Normal range of motion and neck supple. No rigidity. No muscular tenderness.      Right lower leg: No edema.      Left lower leg: No edema.   Lymphadenopathy:      Cervical: No cervical adenopathy.   Skin:     General: Skin is warm and dry.      Capillary Refill: Capillary refill takes less than 2 seconds.      Coloration: Skin is not jaundiced or pale.      Findings: No bruising, erythema, lesion or rash.   Neurological:      General: No focal deficit present.      Mental Status: She is alert and oriented to person, place, and time.      Cranial Nerves: No cranial nerve deficit.      Sensory: No sensory deficit.      Motor: No weakness or abnormal muscle tone.      Coordination: Coordination normal.      Gait: Gait normal.      Deep Tendon Reflexes: Reflexes are normal and symmetric. Reflexes normal.   Psychiatric:         Mood and Affect: Mood normal.         Behavior: Behavior normal.         Thought Content: Thought content normal.         Judgment: Judgment normal.             ASSESSMENT AND PLAN     1. Preop examination    2. Adenocarcinoma of endometrium (HCC/RAF)      EKG: NSR @ 95 BPM, NO ST/T-WAVE CHANGES, NO ARRHYTHMIA  CXR: ORDERED  LABS: COMPLETED ALREADY, MILD ANEMIA AT 10.9 - CONTINUE IRON every other day.  REST OF LABS LOOKED GOOD    NO ACUTE ISSUE OR CONCERN, CLEARED FOR PROCEDURE    Orders Placed This Encounter   ? XR chest pa+lat (2 views)   ? ECG 12-Lead Clinic Performed       Wt Readings from Last 5 Encounters:   03/06/21 209 lb 9.6 oz (95.1 kg)     BP Readings from Last 5 Encounters:   03/06/21 109/75   03/01/21 135/80       No results found for: HGBA1C  No results found for:  CHOL, CHOLDLCAL, CHOLHDL, TRIGLY   Creatinine   Date Value Ref Range Status   03/01/2021 0.62 0.60 - 1.30 mg/dL Final        PHQ-9 Results  Depression Screening (Patient Health Questionnaire PHQ) 03/06/2021   PHQ-2: Feeling down, depressed, or hopeless No   PHQ-2: Little interest or pleassure in doing things No       GAD-7 Results  No flowsheet data found.    DAST Results  No flowsheet data found.    Audit-C results  No flowsheet data found.      No follow-ups on file.  The above plan of care, diagnosis, orders, and follow-up were discussed with the patient.  Questions related to this recommended plan of care were answered.  Lonzo Cloud. Karolee Ohs, DO  03/06/2021    Author:  Lonzo Cloud. Lakitha Gordy 03/06/2021 3:09 PM

## 2021-03-08 ENCOUNTER — Telehealth: Payer: PRIVATE HEALTH INSURANCE

## 2021-03-08 ENCOUNTER — Non-Acute Institutional Stay: Payer: PRIVATE HEALTH INSURANCE

## 2021-03-08 NOTE — Telephone Encounter
Appointment Accommodation Request      Appointment Type: PreOp    Reason for sooner request: Pt states Dr. Raymon Mutton  wanted to see her before her surgery on 2/23, and  after her CT scan. Pt is scheduled for her CT scan tomorrow 2/22 @ 7:30am. Pt would like to know if Dr. Raymon Mutton would still like to see her before the surgery. Pt saw PCP on 2/19. Please contact Pt as soon as possible to advise/schedule.     Date/Time Requested (If any): as soon as possible.    Last seen by MD: 03/01/2021    Any Symptoms:  []  Yes  [x]  No       If yes, what symptoms are you experiencing:   o Duration of symptoms (how long):     Patient or caller was offered an appointment but declined.    Patient or caller was advised to seek emergency services if conditions are urgent or emergent.    Patient or caller has been notified of the turnaround time of 1-2 business (days).

## 2021-03-08 NOTE — Telephone Encounter
HELLO     PATIENT CALLED IN REQUESTING AN INFUSION APPOITNMENT BE SET:     AVAILABLE: SOON AS POSSIBLE   RX: IRON     PLEASE CALL   PH: [place]

## 2021-03-09 ENCOUNTER — Institutional Professional Consult (permissible substitution): Payer: PRIVATE HEALTH INSURANCE

## 2021-03-09 ENCOUNTER — Ambulatory Visit: Payer: PRIVATE HEALTH INSURANCE

## 2021-03-09 ENCOUNTER — Telehealth: Payer: PRIVATE HEALTH INSURANCE

## 2021-03-09 ENCOUNTER — Inpatient Hospital Stay: Payer: PRIVATE HEALTH INSURANCE

## 2021-03-09 ENCOUNTER — Non-Acute Institutional Stay: Payer: PRIVATE HEALTH INSURANCE

## 2021-03-09 DIAGNOSIS — Z01818 Encounter for other preprocedural examination: Secondary | ICD-10-CM

## 2021-03-09 DIAGNOSIS — C541 Malignant neoplasm of endometrium: Secondary | ICD-10-CM

## 2021-03-09 MED ADMIN — PET ISOTOPE 18-F FDG: 13.7 | INTRAVENOUS | @ 16:00:00 | Stop: 2021-03-09

## 2021-03-09 MED ADMIN — IOHEXOL 350 MG/ML IV SOLN: 90 mL | INTRAVENOUS | @ 17:00:00 | Stop: 2021-03-09

## 2021-03-09 MED ADMIN — BARIUM SULFATE 2.1% PO SUSP PLAIN: 450 mL | ORAL | @ 16:00:00 | Stop: 2021-03-09

## 2021-03-09 NOTE — Telephone Encounter
Called patient and no answer again.    She needs to come to ED as her scan shows bilateral pulmonary emboli.

## 2021-03-09 NOTE — Telephone Encounter
Orders Request    What is being requested? (Tests, Labs, Imaging, etc.): urinalysis     Reason for the request:   Symptom: Urine Symptoms  Outcome: Urgent Call - Do NOT Schedule Appointment  Reason: Severe pain when passing urine (peeing)    Where does the patient want to be seen?  [facility]    ? If outside [facility], what is the fax number to the facility?      Has the patient seen their doctor for this matter? No     Last office visit: 03/06/21    Patient or caller was offered an appointment but declined.    Patient or caller has been notified of the turnaround time of 1-2 business day(s).

## 2021-03-09 NOTE — Telephone Encounter
No answer (third try).

## 2021-03-09 NOTE — Telephone Encounter
Called patient a second time, no answer.

## 2021-03-09 NOTE — Telephone Encounter
I have spoken with Ms. Granada and her daughter by phone today. Results of recent imaging was reviewed in detail. We discussed that there is likely evidence of a pulmonary embolus. Recommend that patient goes to the [facility] ER today for evaluation and initiation of treatment. Also we reviewed imaging shows diffuse metastatic disease. Given these findings and concerns for a pulmonary embolus will cancel surgery scheduled for tomorrow. We reviewed that we will review her case at the [facility] multidisciplinary tumor board and make recommendations for treatment plan which will likely entail systemic therapy. All questions were addressed. Ms. Hubner confirmed that she will be going to the [facility] ER today.

## 2021-03-09 NOTE — Telephone Encounter
Call Back Request      Reason for call back: Per pt stated she had chest x-ray done and came back clear ready for surgery, now Dr.Memarzadeh stated she had a blood clot so pt is requesting to know where it was found requesting call back from Doctor, please advise, thank you.    Any Symptoms:  []  Yes  [x]  No       If yes, what symptoms are you experiencing:    o Duration of symptoms (how long):    o Have you taken medication for symptoms (OTC or Rx):      If call was taken outside of clinic hours:    [] Patient or caller has been notified that this message was sent outside of normal clinic hours.     [] Patient or caller has been warm transferred to the physician's answering service. If applicable, patient or caller informed to please call us back if symptoms progress.  Patient or caller has been notified of the turnaround time of 1-2 business day(s).

## 2021-03-09 NOTE — Telephone Encounter
PDL Call to Clinic    Reason for Call: Patient is following up regarding previous message. Patient is frustrated as she has surgery scheduled for 5:00 a.m. tomorrow and has not had any questions answered. Patient states she spoke to the surgery coordinator yesterday as she had questions about the surgery as well and was told she would get a call from the clinical team but has not heard from anyone.     Appointment Related?  [x]  Yes  []  No     If yes;  Date:  Time:    Call warm transferred to PDL: []  Yes  [x]  No    Call Received by Clinic Representative: Delana Meyer and Barnett Applebaum, transferred to surgery coordinator with no answer 2x.     If call not answered/not accepted, call received by Patient Services Representative:

## 2021-03-09 NOTE — Telephone Encounter
Appointment Accommodation Request      Appointment Type: Pre op    Reason for sooner request:Patient states her surgery is tomorrow and she has been calling since Friday for an appt with her and she has yet to receive a call. Informed pt Doctor has not been in since Tuesday 2/14 and that I will send a message for an appt with Doctor [facility] collage     Date/Time Requested (If any): Today     Last seen by MD: 2/14   Yes  [x]  No       If yes, what symptoms are you experiencing:   o Duration of symptoms (how long):   Patient or caller has been notified of the turnaround time of 1- business (day).

## 2021-03-09 NOTE — Telephone Encounter
PDL Call to Clinic    Reason for Call: Patient states she had a procedure scheduled for tomorrow, but has not had a per op appt.  She would like to know if she should be seeing the Dr before her procedure tomorrow?    Appointment Related?  []  Yes  [x]  No     If yes;  Date:  Time:    Call warm transferred to PDL: []  Yes  [x]  No    Call Received by Clinic Representative:    [place] - transferred to Surgery Coordinator- No answer    If call not answered/not accepted, call received by Patient Services Representative:

## 2021-03-09 NOTE — Telephone Encounter
LVM for patient. Will try back shortly.    PET/CT done this morning and shows B/L pulmonary embolism (verbal report given to Dr. Raymon Mutton who relayed message to me). Will instruct patient to go to ED immediately.    Jacelyn Grip PA-C

## 2021-03-09 NOTE — Telephone Encounter
PDL Call to Clinic    Reason for Call: Pt returning [place] calls.    Per Jenny Reichmann stated she is currently not in clinic and will route message to Janett Billow to return Pt's call.    Appointment Related?  []  Yes  [x]  No     If yes;  Date:  Time:    Call warm transferred to PDL: []  Yes  [x]  No    Call Received by Clinic Representative: John    If call not answered/not accepted, call received by Patient Services Representative:

## 2021-03-09 NOTE — Telephone Encounter
Please see below. Patient is requesting a call back.

## 2021-03-10 ENCOUNTER — Ambulatory Visit: Payer: PRIVATE HEALTH INSURANCE

## 2021-03-10 ENCOUNTER — Non-Acute Institutional Stay: Payer: PRIVATE HEALTH INSURANCE

## 2021-03-10 LAB — COVID-19 PCR/TMA

## 2021-03-10 MED ORDER — AMOXICILLIN 500 MG PO CAPS
500 mg | ORAL_CAPSULE | Freq: Three times a day (TID) | ORAL | 0 refills | Status: AC
Start: 2021-03-10 — End: 2021-03-12

## 2021-03-10 NOTE — Telephone Encounter
Attempted to call pt back, left msg advising no current treatment orders in pt chart.  Recommended pt to f/u w/MD

## 2021-03-10 NOTE — Telephone Encounter
Reply by: Jeanie Cooks  Prescription is sent to her pharmacy. Kindly notify our patient. Attempted to call her but no answer.

## 2021-03-10 NOTE — Telephone Encounter
Call Back Request      Reason for call back: Patient said Dr was supposed to put in RX for amoxacillin and pharmacy stating they dont have it. I dont see it was ordered. Please inform pt once ordered    Any Symptoms:  []  Yes  []  No       If yes, what symptoms are you experiencing:    o Duration of symptoms (how long):    o Have you taken medication for symptoms (OTC or Rx):      If call was taken outside of clinic hours:    [] Patient or caller has been notified that this message was sent outside of normal clinic hours.     [] Patient or caller has been warm transferred to the physician's answering service. If applicable, patient or caller informed to please call us back if symptoms progress.  Patient or caller has been notified of the turnaround time of 1-2 business day(s).

## 2021-03-11 ENCOUNTER — Ambulatory Visit: Payer: PRIVATE HEALTH INSURANCE

## 2021-03-11 DIAGNOSIS — C7951 Secondary malignant neoplasm of bone: Secondary | ICD-10-CM

## 2021-03-11 DIAGNOSIS — C779 Secondary and unspecified malignant neoplasm of lymph node, unspecified: Secondary | ICD-10-CM

## 2021-03-11 DIAGNOSIS — I824Y9 Acute embolism and thrombosis of unspecified deep veins of unspecified proximal lower extremity: Secondary | ICD-10-CM

## 2021-03-11 DIAGNOSIS — I2699 Other pulmonary embolism without acute cor pulmonale: Secondary | ICD-10-CM

## 2021-03-11 DIAGNOSIS — D509 Iron deficiency anemia, unspecified: Secondary | ICD-10-CM

## 2021-03-11 NOTE — Telephone Encounter
Please schedule patient when they call back

## 2021-03-12 NOTE — Progress Notes
PATIENT: Alisha Orr  MRN: [id number]  DOB: [birth year]  DATE OF SERVICE: 03/11/2021    CHIEF COMPLAINT:   Chief Complaint   Patient presents with   ? Follow-up     PET/CT showed PE and DVT.  Also showed lymph mets and humeral head mets from uterine cancer.       F/u from ER admission for PE/DVT.  Is on lovenox treatment.      Past Medical History:   Diagnosis Date   ? Acute deep vein thrombosis (DVT) of proximal vein of lower extremity (HCC/RAF) 03/11/2021   ? 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    Social History     Tobacco Use   Smoking Status Former   ? Types: Cigarettes   Smokeless Tobacco Never       No family history on file.    Social History     Substance and Sexual Activity   Alcohol Use None       Social History     Substance and Sexual Activity   Drug Use Not on file       Allergies   Allergen Reactions   ? Codeine Other (See Comments)     REACTION: ''out of body experience''  REACTION: ''out of body experience''  REACTION: ''out of body experience''  REACTION: ''out of body experience''     ? Latex Hives       Patient Active Problem List   Diagnosis   ? Adenocarcinoma of endometrium (HCC/RAF)   ? Allergic rhinitis   ? BMI 32.0-32.9,adult   ? Obesity   ? Dyspareunia   ? Dysphagia   ? Disequilibrium   ? Dysphonia   ? Ear ringing   ? Endometrial cancer (HCC/RAF)   ? GERD (gastroesophageal reflux disease)   ? Hearing loss, sensorineural, asymmetrical   ? History of detached retina repair   ? Impaired hearing   ? Iron deficiency anemia, unspecified   ? Laryngopharyngeal reflux   ? Left lower quadrant pain   ? Leiomyoma of uterus, unspecified   ? 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 Menopausal symptom   ? Prediabetes   ? Secondary open-angle glaucoma   ? Temporomandibular joint disorder   ? Vitamin D deficiency   ? 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 Acute deep vein thrombosis (DVT) of proximal vein of lower extremity (HCC/RAF)         Current Outpatient Medications:   ?  cefuroxime 500 mg tablet, Take 1 tablet (500 mg total) by mouth., Disp: , Rfl:   ?  cholecalciferol 125 mcg (5000 units) tablet, Take 1 tablet (125 mcg total) by mouth., Disp: , Rfl:   ?  enoxaparin 100 mg/mL injection, Inject 0.95 mLs (95 mg total) under the skin., Disp: , Rfl:   ?  ferrous sulfate 324 MG TBEC, Take 1 tablet (324 mg total) by mouth., Disp: , Rfl:   ?  latanoprost 0.005% ophthalmic solution, INSTILL 1 DROP INTO BOTH EYES AT BEDTIME, Disp: , Rfl:   ?  naproxen 500 mg tablet, naproxen 500 mg tablet  TAKE 1 TABLET BY MOUTH TWICE A DAY AS NEEDED, Disp: , Rfl:   ?  timolol 0.5% ophthalmic solution, timolol maleate 0.5 % eye drops  INSTILL 1 DROP IN BOTH EYES EVERY  MORNING, Disp: , Rfl:   ?  ferrous sulfate 324 MG TBEC, Take 1 tablet (324 mg total) by mouth., Disp: , Rfl:   ?  fish oil 1000 mg capsule, Take 1 capsule (1,000 mg total) by mouth., Disp: , Rfl:   No current facility-administered medications for this visit.    Health Maintenance   Topic Date Due   ? COVID-19 Vaccine(Tracks primary and booster doses, not sup/immunocomp) (1) Never done   ? Hepatitis B Screening  Never done   ? Hepatitis C Screening  Never done   ? Shingles (Shingrix) Vaccine (1 of 2) Never done   ? Breast Ca Screening: MAMMOGRAM  Never done   ? Colorectal Cancer Screening  Never done   ? Annual Preventive Wellness Visit  Never done   ? Advance Directive  Never done   ? Pneumococcal Vaccine (1 - PCV) Never done   ? Osteoporosis Early Detection DEXA Scan  Never done   ? Tdap/Td Vaccine (2 - Td or Tdap) 04/24/2020   ? Influenza Vaccine (1) 09/16/2020   ? Prediabetes Screening (See hover text)  03/09/2024       Patient Care Team:  Trena Platt., DO as PCP - General (Family Medicine)      Subjective:      Alisha Orr is a 66 y.o. female.    Review of Systems   Constitutional: Positive for activity change and fatigue. Negative for appetite change, chills, diaphoresis, fever and unexpected weight change.   HENT: Negative.  Negative for congestion, dental problem, drooling, ear discharge, ear pain, facial swelling, hearing loss, mouth sores, nosebleeds, postnasal drip, rhinorrhea, sinus pressure, sinus pain, sneezing, sore throat, tinnitus, trouble swallowing and voice change.    Eyes: Negative.  Negative for photophobia, pain, discharge, redness, itching and visual disturbance.   Respiratory: Negative.  Negative for apnea, cough, choking, chest tightness, shortness of breath, wheezing and stridor.    Cardiovascular: Negative.  Negative for chest pain, palpitations and leg swelling.   Gastrointestinal: Negative.  Negative for abdominal distention, abdominal pain, anal bleeding, blood in stool, constipation, diarrhea, nausea, rectal pain and vomiting.   Endocrine: Negative.  Negative for cold intolerance, heat intolerance, polydipsia, polyphagia and polyuria.   Genitourinary: Negative.  Negative for decreased urine volume, difficulty urinating, dyspareunia, dysuria, enuresis, flank pain, frequency, genital sores, hematuria, menstrual problem, pelvic pain, urgency, vaginal bleeding, vaginal discharge and vaginal pain.   Musculoskeletal: Negative.  Negative for arthralgias, back pain, gait problem, joint swelling, myalgias, neck pain and neck stiffness.   Skin: Negative.  Negative for color change, pallor, rash and wound.   Allergic/Immunologic: Negative.  Negative for environmental allergies, food allergies and immunocompromised state.   Neurological: Negative.  Negative for dizziness, tremors, seizures, syncope, facial asymmetry, speech difficulty, weakness, light-headedness, numbness and headaches.   Hematological: Negative.  Negative for adenopathy. Does not bruise/bleed easily.   Psychiatric/Behavioral: Positive for dysphoric mood. Negative for agitation, behavioral problems, confusion, decreased concentration, hallucinations, self-injury, sleep disturbance and suicidal ideas. The patient is not nervous/anxious and is not hyperactive.    All other systems reviewed and are negative.        Objective:      Physical Exam  Vitals reviewed.   Constitutional:       General: She is not in acute distress.     Appearance: Normal appearance. She is well-developed and normal weight. She is not ill-appearing or toxic-appearing.      Comments: BP 95/63  ~ Pulse 77  ~  Temp 36.6 ?C (97.8 ?F) (Tympanic)  ~ Ht 5' 7'' (1.702 m)  ~ Wt 209 lb (94.8 kg)  ~ SpO2 97%  ~ BMI 32.73 kg/m?      HENT:      Head: Normocephalic and atraumatic.      Right Ear: External ear normal.      Left Ear: External ear normal.      Nose: Nose normal.   Eyes:      Conjunctiva/sclera: Conjunctivae normal.   Pulmonary:      Effort: Pulmonary effort is normal.   Abdominal:      General: Abdomen is flat.      Palpations: Abdomen is soft.   Musculoskeletal:      Cervical back: Normal range of motion and neck supple. No rigidity. No muscular tenderness.   Lymphadenopathy:      Cervical: No cervical adenopathy.   Skin:     General: Skin is warm and dry.   Neurological:      General: No focal deficit present.      Mental Status: She is alert and oriented to person, place, and time.   Psychiatric:         Mood and Affect: Mood normal.         Behavior: Behavior normal.         Thought Content: Thought content normal.         Judgment: Judgment normal.             ASSESSMENT AND PLAN     1. Acute pulmonary embolism, unspecified pulmonary embolism type, unspecified whether acute cor pulmonale present (HCC/RAF)    2. Iron deficiency anemia, unspecified iron deficiency anemia type    3. Adenocarcinoma of endometrium (HCC/RAF)    4. 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 Acute deep vein thrombosis (DVT) of proximal vein of lower extremity, unspecified laterality (HCC/RAF) Refer to hematology to evaluate and consider treatment switch lovenox to NOAC or not.    Refer to pulmonology for optimizing lung capacity during likely continued treatment for metastatic uterine cancer  Follow up with gyn-onc regarding any changes to treatment plan for metastatic uterine cancer    Orders Placed This Encounter   ? Referral to Hematology   ? Referral to Pulmonary Disease   ? enoxaparin 100 mg/mL injection   ? cefuroxime 500 mg tablet   ? cholecalciferol 125 mcg (5000 units) tablet   ? fish oil 1000 mg capsule   ? ferrous sulfate 324 MG TBEC       Wt Readings from Last 5 Encounters:   03/11/21 209 lb (94.8 kg)   03/06/21 209 lb 9.6 oz (95.1 kg)     BP Readings from Last 5 Encounters:   03/11/21 95/63   03/06/21 109/75   03/01/21 135/80       No results found for: HGBA1C  No results found for: CHOL, CHOLDLCAL, CHOLHDL, TRIGLY   Creatinine   Date Value Ref Range Status   03/01/2021 0.62 0.60 - 1.30 mg/dL Final        PHQ-9 Results  Depression Screening (Patient Health Questionnaire PHQ) 03/06/2021   PHQ-2: Feeling down, depressed, or hopeless No   PHQ-2: Little interest or pleassure in doing things No       GAD-7 Results  No flowsheet data found.    DAST Results  No flowsheet data found.    Audit-C results  No flowsheet data found.      No follow-ups on file.  The above plan of care, diagnosis, orders, and follow-up were discussed with the patient.  Questions related to this recommended plan of care were answered.  Lonzo Cloud. Karolee Ohs, DO  03/11/2021    Author:  Lonzo Cloud. Crandall Harvel 03/11/2021 4:20 PM

## 2021-03-15 ENCOUNTER — Non-Acute Institutional Stay: Payer: PRIVATE HEALTH INSURANCE

## 2021-03-15 ENCOUNTER — Telehealth: Payer: PRIVATE HEALTH INSURANCE

## 2021-03-15 ENCOUNTER — Ambulatory Visit: Payer: PRIVATE HEALTH INSURANCE

## 2021-03-15 NOTE — Telephone Encounter
PDL Call to Clinic    Reason for Call: Daughter [name] following up on below request to change today's 1p appt to a VV.     Appointment Related?  [x]  Yes  []  No     If yes;  Date: 2/28  Time:1p    Call warm transferred to PDL: [x]  Yes  []  No    Call Received by Clinic Representative: Maidah     If call not answered/not accepted, call received by Patient Services Representative:

## 2021-03-15 NOTE — Progress Notes
PATIENT: Alisha Orr  MRN: [id number]  DOB: [birth year]  DATE OF SERVICE: 03/15/2021    REFERRING PRACTITIONER: No ref. provider found  PRIMARY CARE PROVIDER: Trena Platt., DO            Subjective:       History of Present Illness:  I am seeing Alisha Orr in follow up today. Takia is a 66 y.o. female with a history of stage IVB high grade uterine carcinoma. Alisha Orr was seen on 03/01/2021.    Tumor History: ?  - 2017: Consultation with Dr. Andrey Farmer for AUB. FSH and LH were in menopausal range and her endometrial biopsy showed inactive endometrium with squamous metaplasia. Was on unopposed estrogen for 10 years. ?  - 12/2020: Vaginal bleeding.   - 02/07/2021: Still experiencing vaginal bleeding. ?  ?  Pap smear revealed adenocarcinoma, favoring endometrial origin. HPV negative.   Pelvic US: Endometrium difficult to evaluate due to large fibroid (13 x 12 x 14 cm). Large fibroid visualized extending throughout entire uterus, possible intramural. There appears to be some fluid with the LUS along internal cervical os, wrapping along side the fibroid.   - 02/15/2021: On exam, enlarged 20 week size uterus.  - 03/01/2021: PET/CT: Markedly enlarged myomatous uterus with intensely FDG-avid expansile endometrial lesion corresponding to known malignancy. Intensely FDG avid confluent retroperitoneal and bilateral iliac chain lymphadenopathy as well as at least 3 FDG avid intrathoracic nodal metastases, and right humeral head metastasis. Bilateral multifocal pulmonary emboli. No evidence of right heart strain, but clot burden appears significant.  - 03/11/2021: [place] [facility] - here to review    03/15/2021: Here for follow up.  Has started treatment with lovenox for newly diagnosed PE. She has been seen by her PCP and also referred to pulmonology.    Past Medical History:   Diagnosis Date   ? Acute deep vein thrombosis (DVT) of proximal vein of lower extremity (HCC/RAF) 03/11/2021   ? 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  Outpatient Medications Prior to Visit   Medication Sig   ? cefuroxime 500 mg tablet Take 1 tablet (500 mg total) by mouth.   ? cholecalciferol 125 mcg (5000 units) tablet Take 1 tablet (125 mcg total) by mouth.   ? enoxaparin 100 mg/mL injection Inject 0.95 mLs (95 mg total) under the skin.   ? ferrous sulfate 324 MG TBEC Take 1 tablet (324 mg total) by mouth.   ? ferrous sulfate 324 MG TBEC Take 1 tablet (324 mg total) by mouth.   ? fish oil 1000 mg capsule Take 1 capsule (1,000 mg total) by mouth.   ? latanoprost 0.005% ophthalmic solution INSTILL 1 DROP INTO BOTH EYES AT BEDTIME   ? naproxen 500 mg tablet naproxen 500 mg tablet   TAKE 1 TABLET BY MOUTH TWICE A DAY AS NEEDED   ? timolol 0.5% ophthalmic solution timolol maleate 0.5 % eye drops   INSTILL 1 DROP IN BOTH EYES EVERY MORNING     No facility-administered medications prior to visit.     Allergies   Allergen Reactions   ? Codeine Other (See Comments)     REACTION: ''out of body experience''  REACTION: ''out of body experience''  REACTION: ''out of body experience''  REACTION: ''out of body experience''     ? Latex Hives     No family history on file.  Social History     Socioeconomic  History   ? Marital status: Other   Tobacco Use   ? Smoking status: Former     Types: Cigarettes   ? Smokeless tobacco: Never       Review of Systems: A 14-system review of systems was performed and is negative except as stated in the history of present illness.      Objective:      Physical Exam:  There were no vitals taken for this visit.    A medical chaperone was present for all portions of the exam/procedure:     Gen: AAOx3; NAD  No exam as this is a telehealth visit.     Lab Review:  Component      Latest Ref Rng & Units 03/01/2021   CA-125      0 - 35 U/mL 10       Pathology:  02/07/2021: Pap smear with HPV co-testing  ?    02/07/2021: EMB  FINAL DIAGNOSIS       ENDOMETRIUM (BIOPSY) [[id number]; 02/07/2021]:  - Poorly differentiated high grade malignant neoplasm, see comment  ?  Comment: Sections show solid sheets of a high grade necrotic tumor with small, round tumor cells showing high N/C ratio, hyperchromasia, and high mitotic activity.  Immunohistochemically tumor cells show negative CK AE1/3 and ER, positive PAX8, P16, and cyclin D1, null-type aberrant P53, and some neuroendocrine expression (CD56).  This uterine tumor has a broad differential diagnosis which includes undifferentiated carcinoma, high grade sarcoma, or a sarcoma component of MMMT.    ?  Preliminary findings discussed with Dr. Melody Comas (03/08/2021, 5:45 pm).       Radiology:  03/09/2021: PET/CT  ONCOLOGIC FINDINGS:  ?  History of biopsy-proven endometrial adenocarcinoma with:    ?  - Enlarged myomatous uterus with multiple fibroids, the largest a submucosal anterior body fibroid measuring 10 x 14.4 cm, with an expansile heterogeneous intensely FDG avid endometrial mass, corresponding to the known malignancy;      -Intensely FDG avid lymphadenopathy including multiple intrathoracic nodes (left supraclavicular, left upper paratracheal and paraesophageal) and confluent retroperitoneal and bilateral iliac chain lymph nodes  ?  -Intensely FDG avid focus in the right humeral head measuring 14 x 18 mm (6-71, SUV max 13.4).  ?  -Mild perihepatic ascites and presacral fluid stranding without definite peritoneal nodularity.  ?  ?  TARGET LESIONS:  ?  -Right external iliac lymph node: 24 x 36 mm (6-303, SUV max 12.9).  ?  -Left para-aortic lymph node conglomerate: 44 x 53 mm (6-230, SUV max 15.7).  ?  ?  ADDITIONAL FINDINGS:  ?  BASE OF HEAD AND NECK:    Mucosal retention cyst in the left maxillary sinus.  Left thyroid nodule.  ?  CHEST:  Lungs: Punctate subpleural pulmonary nodules in the right upper lobe (7-846) and the left lower lobe (8-246).  Pleura: Unremarkable.  Cardiovascular: Extensive filling defects in the main and all bilateral lobar pulmonary artery branches. No definite CT findings of right heart strain.  ?  ABDOMEN/PELVIS:   Liver: Unremarkable.  Gallbladder and bile ducts: Unremarkable.  Spleen, pancreas, adrenals: Unremarkable.  Kidneys and ureters: Unremarkable.  Bowel: Unremarkable. Normal appendix.  Bladder: Underdistended with circumferential wall thickening out of proportion to degree of distention.  Vessels: IVC compressed by large expansile uterus.  Peritoneum: No masses.  Abdominal wall: Unremarkable.  ?  MUSCULOSKELETAL:  See above.  ?  ?  IMPRESSION:   1.  Markedly enlarged myomatous uterus with intensely FDG-avid expansile endometrial  lesion corresponding to known malignancy.  2.  Intensely FDG avid confluent retroperitoneal and bilateral iliac chain lymphadenopathy as well as at least 3 FDG avid intrathoracic nodal metastases, and right humeral head metastasis.  3.  Bilateral multifocal pulmonary emboli. No evidence of right heart strain, but clot burden appears significant.  4.  The urinary bladder is thickened for the degree of underdistention. Correlate with urinalysis.  ?  Findings were discussed with Dr. Lafonda Mosses via phone at 11:44 AM.  ?  ?  Thank you for allowing [facility] to assist in the care of this patient.   ?  This study has been reviewed jointly by Radiologist Dr. Alexia Freestone and Nuclear Medicine Physician Dr. Lamonte Richer The above constitutes a synthesis of both the CT and PET findings and the physicians have reviewed and are in agreement with the report.  ?  Dictated by: Danella Penton   03/09/2021 11:46 AM       Assessment:     Alisha Orr is a 66 y.o. female with a history of stage IVB high grade uterine carcinoma.      Plan/ Recommendation:   1) Treatment for PE has been initiated- on lovenox.  2) Has scheduled appointment with Dr. Myna Hidalgo to plan and initiate neoadjuvant chemotherapy.   3) Referral has been placed for genetics- recommend follow up.  4) Reviewed consideration for image guided biopsy to yield additional tissue for diagnostic purposes and molecular testing including MMR status. Alisha Orr has opted to proceed. Orders placed.   5) Result of recent PET/CT scan were reviewed in detail- findings are consistent with metastatic disease. She denies any pain on the right side where a bone metastasis is seen. We reviewed based on findings recommendation is to proceed with neoadjuvant chemotherapy and reassess for interval cytoreduction if a favorable response to therapy is seen.      I have recommended to Alisha Orr that we follow up in 6 weeks. Alisha Orr and family members have had all questions answered satisfactorily and are in agreement with this recommended plan of care.    Total time in consultation with the patient was approximately 30 minutes. Greater than 50% of this time was spent in counseling and/or coordinating care for this patient.        cc Trena Platt., DO         Author: Melody Comas, MD, PhD   Professor  Division of Gynecologic Oncology  Department of Obstetrics and Gynecology  03/15/2021 5:19 PM

## 2021-03-15 NOTE — Telephone Encounter
Call Back Request      Reason for call back: Pt requesting if follow up appt today @1  could be switched to vv or telephone encounter, please advise, thank you.     Any Symptoms:  []  Yes  [x]  No       If yes, what symptoms are you experiencing:    o Duration of symptoms (how long):    o Have you taken medication for symptoms (OTC or Rx):      If call was taken outside of clinic hours:    [] Patient or caller has been notified that this message was sent outside of normal clinic hours.     [] Patient or caller has been warm transferred to the physician's answering service. If applicable, patient or caller informed to please call us back if symptoms progress.  Patient or caller has been notified of the turnaround time of 1-2 business day(s).

## 2021-03-15 NOTE — Progress Notes
Hematology Oncology Note    Patient name:  Alisha Orr  MRN:  [id number]  DOB:  [birth year]  CSN: [id number]  Date of encounter: 03/15/2021  Referring provider: No ref. provider found  PCP: Trena Platt., DO  Provider: Kandis Fantasia, MD      PENDING NOTE    Reason for visit/cc: metastatic high grade uterine carcinoma    HPI:  History of uterine fibroids referred to oncology clinic for newly diagnosed metastatic high grade uterine cancer.  Course complicated by PE.  Patient with strong family history of cancer.  Patient was on unopposed estrogen for 10-12 years.        12/2020: vaginal bleeding     02/07/2021: Still experiencing vaginal bleeding. ?  On exam, enlarged 20 week size uterus.  ?  Pelvic US: Endometrium difficult to evaluate due to large fibroid (13 x 12 x 14 cm). Large fibroid visualized extending throughout entire uterus, possible intramural. There appears to be some fluid with the LUS along internal cervical os, wrapping along side the fibroid.     02/07/21: ENDOMETRIUM (BIOPSY): [place] review  - Poorly differentiated high grade malignant neoplasm  Comment: Sections show solid sheets of a high grade necrotic tumor with small, round tumor cells showing high N/C ratio, hyperchromasia, and high mitotic activity.  Immunohistochemically tumor cells show negative CK AE1/3 and ER, positive PAX8, P16, and cyclin D1, null-type aberrant P53, and some neuroendocrine expression (CD56).  This uterine tumor has a broad differential diagnosis which includes undifferentiated carcinoma, high grade sarcoma, or a sarcoma component of MMMT    03/09/21: PET with markedly enlarged myomatous uterus with intensely FDG-avid expansile endometrial lesion corresponding to known malignancy. Intensely FDG avid confluent retroperitoneal and bilateral iliac chain lymphadenopathy as well as at least 3 FDG avid intrathoracic nodal metastases, and right humeral head metastasis. Bilateral multifocal pulmonary emboli. No evidence of right heart strain, but clot burden appears significant. The urinary bladder is thickened for the degree of underdistention. Correlate with urinalysis    03/09/21: presented to outside ER for evaluation of PE and discharged on lovenox  03/10/21: LE ultrasound with positive for acute partially occlusive DVT of the left posterior tibial vein. No evidence of deep or superficial venous thpombosis in the right lower extremity        Subjective:      lovenox  abx for UTI, f/u culture     ECOG performance status:    Objective:  Current medications:  Outpatient Medications Prior to Visit   Medication Sig   ? cefuroxime 500 mg tablet Take 1 tablet (500 mg total) by mouth.   ? cholecalciferol 125 mcg (5000 units) tablet Take 1 tablet (125 mcg total) by mouth.   ? enoxaparin 100 mg/mL injection Inject 0.95 mLs (95 mg total) under the skin.   ? ferrous sulfate 324 MG TBEC Take 1 tablet (324 mg total) by mouth.   ? ferrous sulfate 324 MG TBEC Take 1 tablet (324 mg total) by mouth.   ? fish oil 1000 mg capsule Take 1 capsule (1,000 mg total) by mouth.   ? latanoprost 0.005% ophthalmic solution INSTILL 1 DROP INTO BOTH EYES AT BEDTIME   ? naproxen 500 mg tablet naproxen 500 mg tablet   TAKE 1 TABLET BY MOUTH TWICE A DAY AS NEEDED   ? timolol 0.5% ophthalmic solution timolol maleate 0.5 % eye drops   INSTILL 1 DROP IN BOTH EYES EVERY MORNING     No facility-administered medications prior to  visit.       Allergies:  Allergies   Allergen Reactions   ? Codeine Other (See Comments)     REACTION: ''out of body experience''  REACTION: ''out of body experience''  REACTION: ''out of body experience''  REACTION: ''out of body experience''     ? Latex Hives       Review of Systems:  A 14 point review of systems was negative., except as per HPI     Past Medical History:  Past Medical History:   Diagnosis Date   ? Acute deep vein thrombosis (DVT) of proximal vein of lower extremity (HCC/RAF) 03/11/2021   ? 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    Social History:  Social History     Tobacco Use   ? Smoking status: Former     Types: Cigarettes   ? Smokeless tobacco: Never   Substance Use Topics   ? Alcohol use: Not on file     Family History:  No family history on file.    Vitals:  BP: --  Temp: --  Temp Source: --  Heart Rate: --  Resp: --  SpO2: --  Height: --  Weight: --    Physical Exam:  General: Appears well-developed, well-nourished and close to stated age.   Head: Normocephalic, atraumatic.  Eyes: Without icterus.   ENT: Oropharynx is clear, mucus membranes are moist.  No oral ulcers noted.   Neck: Supple. Trachea midline.   CV: Regular in rate and rhythm, no murmurs or gallops.   Chest: Clear to auscultation bilaterally without wheezing or rhonchi. Respiratory effort appears normal.   Abdomen: Soft, nontender and nondistended. No organomegaly is appreciated  Musculoskeletal: No edema. No cyanosis. Extremities are warm and well-perfused.   Neurologic: Gait appears normal. Oriented to person, place and time.   Hematologic: No bruising, purpura or petechiae are noted.   Dermatologic: No rashes appreciated.   Lymphatic: No palpable cervical, supraclavicular, axillary adenopathy appreciated.   Psychiatric: Affect appropriate.  Pleasant and conversant.     Recent Labs:  Lab Results   Component Value Date    NA 139 03/01/2021    K 4.1 03/01/2021    CL 100 03/01/2021    CO2 23 03/01/2021    CREAT 0.62 03/01/2021    BUN 10 03/01/2021    GLUCOSE 111 (H) 03/01/2021    CALCIUM 9.1 03/01/2021     Lab Results   Component Value Date    WBC 6.28 03/01/2021    HGB 10.9 (L) 03/01/2021    HCT 33.4 (L) 03/01/2021    MCV 89.5 03/01/2021    PLT 298 03/01/2021    NEUTPCT 63.5 03/01/2021    NEUTABS 3.99 03/01/2021    MONOPCT 10.8 03/01/2021    MONOABS 0.68 03/01/2021     Lab Results   Component Value Date    TOTPRO 7.5 03/01/2021    ALBUMIN 4.1 03/01/2021 BILITOT 0.3 03/01/2021    AST 69 (H) 03/01/2021    ALT 13 03/01/2021    ALKPHOS 66 03/01/2021       Impression and Recommendations:  Alisha Orr is a 66 y.o. 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  Course complicated by PE/DVT.         -plan for neoadjuvant carboplatin plus paclitaxel.  Consider pembro   -monitor CBC with diff,  CMP, TSH, d-dimer    -CA125 not a good marker  -per pathology tissue necrotic.  Agree with LN biopsy for MMR, Her2, NGS  -referred to genetics  -recommend bisphosphonate therapy in setting of bone mets    -dental clearance    -consider palliative xrt to bone if symptomatic   -lovenox  -refer to cards  -port?  -IVC filter?  -Simms Mann?  -palliative?  -return precautions given       Symptom management:  -nausea: zofran prn, hydration prn  -pain:  -diarrhea: imodium prn  -constipation: miralax prn, senna prn      Treatment team:  -Dr Perfecto Kingdom  -Dr Melody Comas       Return to clinic:  No follow-ups on file.    Orders:  No orders of the defined types were placed in this encounter.        Attending Physician: Kandis Fantasia, MD.  Author: Kandis Fantasia, MD

## 2021-03-16 ENCOUNTER — Ambulatory Visit: Payer: PRIVATE HEALTH INSURANCE

## 2021-03-16 ENCOUNTER — Telehealth: Payer: PRIVATE HEALTH INSURANCE

## 2021-03-16 NOTE — Telephone Encounter
Call Back Request      Reason for call back: Patient needs to reschedule appointment on 03/17/21, she has a schedule conflict.    Any Symptoms:  []  Yes  [x]  No       If yes, what symptoms are you experiencing:    o Duration of symptoms (how long):    o Have you taken medication for symptoms (OTC or Rx):      If call was taken outside of clinic hours:    [] Patient or caller has been notified that this message was sent outside of normal clinic hours.     [] Patient or caller has been warm transferred to the physician's answering service. If applicable, patient or caller informed to please call us back if symptoms progress.  Patient or caller has been notified of the turnaround time of 1-2 business day(s).

## 2021-03-16 NOTE — Telephone Encounter
Called pt - VM full - will try again later.

## 2021-03-16 NOTE — Telephone Encounter
Appointment Accommodation Request      Appointment Type: NEW-GYN  Reason for sooner request: Endometrial cancer     Date/Time Requested (If any): no preference     Last seen by MD: New    Any Symptoms:  []  Yes  [x]  No       If yes, what symptoms are you experiencing:   o Duration of symptoms (how long):     Patient or caller was offered an appointment but declined.    Patient or caller was advised to seek emergency services if conditions are urgent or emergent.    Patient or caller has been notified of the turnaround time of 1-2 business (days).

## 2021-03-17 ENCOUNTER — Non-Acute Institutional Stay: Payer: PRIVATE HEALTH INSURANCE

## 2021-03-17 ENCOUNTER — Telehealth: Payer: PRIVATE HEALTH INSURANCE

## 2021-03-17 ENCOUNTER — Ambulatory Visit: Payer: PRIVATE HEALTH INSURANCE

## 2021-03-17 NOTE — Telephone Encounter
Pt has been r/s  

## 2021-03-17 NOTE — Telephone Encounter
PDL Call to Clinic    Reason for Call: patients daughter is returning call for [facility].       Appointment Related?  [x]  Yes  []  No     If yes; TBD   Date:  Time:    Call warm transferred to PDL: [x]  Yes  []  No    Call Received by Clinic Representative: Danielle     If call not answered/not accepted, call received by Patient Services Representative:

## 2021-03-18 ENCOUNTER — Non-Acute Institutional Stay: Payer: PRIVATE HEALTH INSURANCE

## 2021-03-18 ENCOUNTER — Inpatient Hospital Stay: Payer: PRIVATE HEALTH INSURANCE

## 2021-03-18 DIAGNOSIS — C541 Malignant neoplasm of endometrium: Secondary | ICD-10-CM

## 2021-03-21 ENCOUNTER — Non-Acute Institutional Stay: Payer: PRIVATE HEALTH INSURANCE

## 2021-03-21 ENCOUNTER — Ambulatory Visit: Payer: PRIVATE HEALTH INSURANCE

## 2021-03-22 ENCOUNTER — Ambulatory Visit: Payer: PRIVATE HEALTH INSURANCE

## 2021-03-22 DIAGNOSIS — R1110 Vomiting, unspecified: Secondary | ICD-10-CM

## 2021-03-22 DIAGNOSIS — R197 Diarrhea, unspecified: Secondary | ICD-10-CM

## 2021-03-22 DIAGNOSIS — C779 Secondary and unspecified malignant neoplasm of lymph node, unspecified: Secondary | ICD-10-CM

## 2021-03-22 DIAGNOSIS — E860 Dehydration: Secondary | ICD-10-CM

## 2021-03-22 DIAGNOSIS — I824Y9 Acute embolism and thrombosis of unspecified deep veins of unspecified proximal lower extremity: Secondary | ICD-10-CM

## 2021-03-22 DIAGNOSIS — E6609 Other obesity due to excess calories: Secondary | ICD-10-CM

## 2021-03-22 DIAGNOSIS — D509 Iron deficiency anemia, unspecified: Secondary | ICD-10-CM

## 2021-03-22 MED ORDER — ONDANSETRON 4 MG PO TBDP
4-8 mg | ORAL_TABLET | Freq: Three times a day (TID) | ORAL | 1 refills | 8.00000 days | Status: AC | PRN
Start: 2021-03-22 — End: ?

## 2021-03-22 MED ORDER — ONDANSETRON HCL 4 MG PO TABS
4 mg | ORAL_TABLET | Freq: Once | ORAL | 0 refills | Status: AC
Start: 2021-03-22 — End: 2021-03-23

## 2021-03-22 MED ADMIN — SODIUM CHLORIDE 0.9 % IV BOLUS: 500 mL | INTRAVENOUS | @ 21:00:00 | Stop: 2021-03-22

## 2021-03-22 NOTE — Telephone Encounter
PDL Call to Clinic    Reason for Call: Pt adamant about speaking to a nurse in regards to an injection she was advised in the ER. Per pt, wants to know why she has to wait so long for her to get anything done.     Appointment Related?  '[x]'$  Yes  '[]'$  No     If yes;  Date: 03/24/21  Time: 3:00 pm    Call warm transferred to PDL: '[x]'$  Yes  '[]'$  No    Call Received by Clinic Representative: Legrand Como     If call not answered/not accepted, call received by Patient Services Representative:

## 2021-03-22 NOTE — Progress Notes
PATIENT: Alisha Orr  MRN: [id number]  DOB: [birth year]  DATE OF SERVICE: 03/22/2021    CHIEF COMPLAINT:   Chief Complaint   Patient presents with   ? Fatigue     Has not been eating and drinking, vomiting    ? Medication Problem     Lovenox shot     Oral THC oil helping with a lot of symptoms.      Past Medical History:   Diagnosis Date   ? Acute deep vein thrombosis (DVT) of proximal vein of lower extremity (HCC/RAF) 03/11/2021   ? Endometrial cancer (HCC/RAF)    ? 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 ENDOMETRIAL BIOPSY         Social History     Tobacco Use   Smoking Status Former   ? Types: Cigarettes   Smokeless Tobacco Never       No family history on file.    Social History     Substance and Sexual Activity   Alcohol Use None       Social History     Substance and Sexual Activity   Drug Use Not on file       Allergies   Allergen Reactions   ? Codeine Other (See Comments)     REACTION: ''out of body experience''  REACTION: ''out of body experience''  REACTION: ''out of body experience''  REACTION: ''out of body experience''     ? Latex Hives       Patient Active Problem List   Diagnosis   ? Adenocarcinoma of endometrium (HCC/RAF)   ? Allergic rhinitis   ? BMI 32.0-32.9,adult   ? Obesity   ? Dyspareunia   ? Dysphagia   ? Disequilibrium   ? Dysphonia   ? Ear ringing   ? Endometrial cancer (HCC/RAF)   ? GERD (gastroesophageal reflux disease)   ? Hearing loss, sensorineural, asymmetrical   ? History of detached retina repair   ? Impaired hearing   ? Iron deficiency anemia, unspecified   ? Laryngopharyngeal reflux   ? Left lower quadrant pain   ? Leiomyoma of uterus, unspecified   ? 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 Menopausal symptom   ? Prediabetes   ? Secondary open-angle glaucoma   ? Temporomandibular joint disorder   ? Vitamin D deficiency   ? 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 Acute deep vein thrombosis (DVT) of proximal vein of lower extremity (HCC/RAF)         Current Outpatient Medications:   ?  enoxaparin 100 mg/mL injection, Inject 0.95 mLs (95 mg total) under the skin., Disp: , Rfl:   ?  cefuroxime 500 mg tablet, Take 1 tablet (500 mg total) by mouth. (Patient not taking: Reported on 03/22/2021.), Disp: , Rfl:   ?  cholecalciferol 125 mcg (5000 units) tablet, Take 1 tablet (125 mcg total) by mouth. (Patient not taking: Reported on 03/22/2021.), Disp: , Rfl:   ?  ferrous sulfate 324 MG TBEC, Take 1 tablet (324 mg total) by mouth. (Patient not taking: Reported on 03/22/2021.), Disp: , Rfl:   ?  ferrous sulfate 324 MG TBEC, Take 1 tablet (324 mg total) by mouth. (Patient not taking: Reported on 03/22/2021.), Disp: , Rfl:   ?  fish oil  1000 mg capsule, Take 1 capsule (1,000 mg total) by mouth. (Patient not taking: Reported on 03/22/2021.), Disp: , Rfl:   ?  latanoprost 0.005% ophthalmic solution, INSTILL 1 DROP INTO BOTH EYES AT BEDTIME (Patient not taking: Reported on 03/22/2021.), Disp: , Rfl:   ?  naproxen 500 mg tablet, naproxen 500 mg tablet  TAKE 1 TABLET BY MOUTH TWICE A DAY AS NEEDED (Patient not taking: Reported on 03/22/2021.), Disp: , Rfl:   ?  ondansetron ODT 4 mg disintegrating tablet, Take 1-2 tablets (4-8 mg total) by mouth every eight (8) hours as needed for Nausea or Vomiting., Disp: 60 tablet, Rfl: 1  ?  timolol 0.5% ophthalmic solution, timolol maleate 0.5 % eye drops  INSTILL 1 DROP IN BOTH EYES EVERY MORNING (Patient not taking: Reported on 03/22/2021.), Disp: , Rfl:   No current facility-administered medications for this visit.    Health Maintenance   Topic Date Due   ? COVID-19 Vaccine(Tracks primary and booster doses, not sup/immunocomp) (1) Never done   ? Hepatitis B Screening  Never done   ? Hepatitis C Screening  Never done   ? Shingles (Shingrix) Vaccine (1 of 2) Never done   ? Breast Ca Screening: MAMMOGRAM Never done   ? Colorectal Cancer Screening  Never done   ? Annual Preventive Wellness Visit  Never done   ? Advance Directive  Never done   ? Pneumococcal Vaccine (1 - PCV) Never done   ? Osteoporosis Early Detection DEXA Scan  Never done   ? Tdap/Td Vaccine (2 - Td or Tdap) 04/24/2020   ? Influenza Vaccine (1) 09/16/2020   ? Prediabetes Screening (See hover text)  03/09/2024       Patient Care Team:  Trena Platt., DO as PCP - General (Family Medicine)      Subjective:      Josette Shimabukuro is a 66 y.o. female.    Review of Systems   Constitutional: Positive for activity change, appetite change and fatigue. Negative for chills, diaphoresis, fever and unexpected weight change.   HENT: Positive for trouble swallowing. Negative for congestion, dental problem, drooling, ear discharge, ear pain, facial swelling, hearing loss, mouth sores, nosebleeds, postnasal drip, rhinorrhea, sinus pressure, sinus pain, sneezing, sore throat, tinnitus and voice change.    Eyes: Negative.  Negative for photophobia, pain, discharge, redness, itching and visual disturbance.   Respiratory: Negative.  Negative for apnea, cough, choking, chest tightness, shortness of breath, wheezing and stridor.    Cardiovascular: Positive for leg swelling (improving with lasix from [facility] cardiologist). Negative for chest pain and palpitations.   Gastrointestinal: Positive for abdominal distention (chronic very large fibroids), abdominal pain, diarrhea, nausea and vomiting. Negative for anal bleeding, blood in stool, constipation and rectal pain.   Endocrine: Negative.  Negative for cold intolerance, heat intolerance, polydipsia, polyphagia and polyuria.   Genitourinary: Negative.  Negative for decreased urine volume, difficulty urinating, dyspareunia, dysuria, enuresis, flank pain, frequency, genital sores, hematuria, menstrual problem, pelvic pain, urgency, vaginal bleeding, vaginal discharge and vaginal pain.   Musculoskeletal: Positive for arthralgias and myalgias. Negative for back pain, gait problem, joint swelling, neck pain and neck stiffness.   Skin: Negative.  Negative for color change, pallor, rash and wound.   Allergic/Immunologic: Negative.  Negative for environmental allergies, food allergies and immunocompromised state.   Neurological: Positive for weakness and light-headedness. Negative for dizziness, tremors, seizures, syncope, facial asymmetry, speech difficulty, numbness and headaches.   Hematological: Negative for adenopathy. Bruises/bleeds easily.   Psychiatric/Behavioral:  Positive for decreased concentration and dysphoric mood. Negative for agitation, behavioral problems, confusion, hallucinations, self-injury, sleep disturbance and suicidal ideas. The patient is not nervous/anxious and is not hyperactive.    All other systems reviewed and are negative.        Objective:      Physical Exam  Vitals reviewed.   Constitutional:       General: She is not in acute distress.     Appearance: Normal appearance. She is well-developed. She is obese. She is ill-appearing (fatigued and lethargic). She is not toxic-appearing or diaphoretic.      Comments: BP 119/80 (BP Location: Right arm, Patient Position: Standing, Cuff Size: Regular)  ~ Pulse 83  ~ Temp 37.1 ?C (98.7 ?F) (Tympanic)  ~ Resp 20  ~ Wt 206 lb 6.4 oz (93.6 kg)  ~ SpO2 98%  ~ BMI 32.33 kg/m?      HENT:      Head: Normocephalic and atraumatic.      Right Ear: Tympanic membrane, ear canal and external ear normal. There is no impacted cerumen.      Left Ear: Tympanic membrane, ear canal and external ear normal. There is no impacted cerumen.      Nose: Nose normal. No congestion or rhinorrhea.      Mouth/Throat:      Mouth: Mucous membranes are moist.      Pharynx: Oropharynx is clear. No oropharyngeal exudate or posterior oropharyngeal erythema.   Eyes:      General: No scleral icterus.        Right eye: No discharge.         Left eye: No discharge.      Extraocular Movements: Extraocular movements intact.      Conjunctiva/sclera: Conjunctivae normal.      Pupils: Pupils are equal, round, and reactive to light.   Neck:      Thyroid: No thyromegaly.      Vascular: No carotid bruit.   Cardiovascular:      Rate and Rhythm: Normal rate and regular rhythm.      Pulses: Normal pulses.      Heart sounds: Normal heart sounds. No murmur heard.    No friction rub. No gallop.   Pulmonary:      Effort: Pulmonary effort is normal. No respiratory distress.      Breath sounds: Normal breath sounds. No stridor. No wheezing, rhonchi or rales.   Chest:      Chest wall: No tenderness.   Abdominal:      General: Abdomen is flat. Bowel sounds are normal. There is no distension.      Palpations: There is mass (known large fibroids appreciated).      Tenderness: There is no abdominal tenderness. There is no right CVA tenderness, left CVA tenderness, guarding or rebound.      Hernia: No hernia is present.   Musculoskeletal:         General: No swelling, tenderness, deformity or signs of injury. Normal range of motion.      Cervical back: Normal range of motion and neck supple. No rigidity. No muscular tenderness.      Right lower leg: Edema present.      Left lower leg: Edema present.   Lymphadenopathy:      Cervical: No cervical adenopathy.   Skin:     General: Skin is warm and dry.      Capillary Refill: Capillary refill takes less than 2 seconds.      Coloration:  Skin is not jaundiced or pale.      Findings: No bruising, erythema, lesion or rash.   Neurological:      General: No focal deficit present.      Mental Status: She is alert and oriented to person, place, and time.      Cranial Nerves: No cranial nerve deficit.      Sensory: No sensory deficit.      Motor: Weakness present. No abnormal muscle tone.      Coordination: Coordination normal.      Gait: Gait normal.      Deep Tendon Reflexes: Reflexes are normal and symmetric. Reflexes normal.   Psychiatric:         Mood and Affect: Mood normal.         Behavior: Behavior normal.         Thought Content: Thought content normal.         Judgment: Judgment normal.             ASSESSMENT AND PLAN     1. Vomiting, unspecified vomiting type, unspecified whether nausea present    2. Diarrhea, unspecified type    3. Dehydration    4. Iron deficiency anemia, unspecified iron deficiency anemia type    5. Adenocarcinoma of endometrium (HCC/RAF)    6. Malignant neoplasm metastatic to lymph nodes, unspecified lymph node region (HCC/RAF)    7. Acute deep vein thrombosis (DVT) of proximal vein of lower extremity, unspecified laterality (HCC/RAF)    8. Obesity due to excess calories without serious comorbidity, unspecified classification      THC oil helping control vomiting, will send in zofran as well.  Pt decline zofran in office.  Discussed not likely rxn to lovenox - continue that.  Sees heme/onc Thursday to discussed metastatic cancer treatment, but also hopes to discuss ability to switch to oral NOAC - hates the shots and not much help to be able to give them.  But also take into consideration future procedures like upcoming biopsy where lovenox is easier to use than oral NOAC when days are needed before procedure to flush out of system.      Supposedly now question of this is uterine cancer or not - why plan on doing another biopsy.  PET/CT did show lymph mets and possible humeral mets.  Has been told will need multiple rounds of chemo before removal is considered - but may need palliative debulking at least of uterus with very large fibroids and likely primary cancer.  Likely these masses are pushing up on abdominal cavity and slowing transit to cause N/V/D.      IVF 500 cc bolus helped with energy and lightheadedness.  Monitored and evaluated pt periodically before, during, and after infusion over 30-40 min.        Orthostatic Values     First Set Last Set   Heart Rate: Heart Rate: 85 (03/22/21 1104) Heart Rate: 83 (03/22/21 1138)   Blood Pressure: BP: 121/82 (03/22/21 1104) BP: 119/80 (03/22/21 1138)   Position: Patient Position: Lying (03/22/21 1128) Patient Position: Standing (03/22/21 1138)   BP Location: BP Location: Right arm (03/22/21 1128) BP Location: Right arm (03/22/21 1138)   Cuff Size: Cuff Size: Regular (03/22/21 1128) Cuff Size: Regular (03/22/21 1138)   Comments:             Orders Placed This Encounter   ? C.difficile PCR with Reflex to Toxin Antigen   ? H pylori Ag, Stool   ? Bact  Enteric Pathogen Panel PCR, Stool   ? Parasite Enteric Pathogen Panel   ? [place] abd complete   ? Albumin/Creat Ratio Ur   ? CBC & Auto Differential   ? Folate,Serum   ? Hgb A1c   ? Lipid Panel   ? Vitamin D,25-Hydroxy   ? Vitamin B12   ? Urinalysis w/Reflex to Culture   ? TSH with reflex FT4, FT3   ? PTH, Intact   ? Magnesium   ? Uric Acid   ? Comprehensive Metabolic Panel   ? Prothrombin Time Panel   ? APTT   ? Amylase   ? Lipase   ? Sedimentation Rate, Erythrocyte   ? C-Reactive Protein   ? Iron & Iron Binding Capacity   ? Ferritin   ? CK, Total   ? Free T4   ? Orthostatic blood pressure   ? ECG 12-Lead Clinic Performed   ? DISCONTD: ondansetron 4 mg/2 mL inj 4 mg   ? DISCONTD: ondansetron 4 mg tablet   ? sodium chloride 0.9% IV soln bolus 500 mL   ? ondansetron ODT 4 mg disintegrating tablet       Wt Readings from Last 5 Encounters:   03/22/21 206 lb 6.4 oz (93.6 kg)   03/11/21 209 lb (94.8 kg)   03/06/21 209 lb 9.6 oz (95.1 kg)     BP Readings from Last 5 Encounters:   03/22/21 119/80   03/11/21 95/63   03/06/21 109/75   03/01/21 135/80       No results found for: HGBA1C  No results found for: CHOL, CHOLDLCAL, CHOLHDL, TRIGLY   Creatinine   Date Value Ref Range Status   03/01/2021 0.62 0.60 - 1.30 mg/dL Final        PHQ-9 Results  Depression Screening (Patient Health Questionnaire PHQ) 03/06/2021   PHQ-2: Feeling down, depressed, or hopeless No   PHQ-2: Little interest or pleassure in doing things No       GAD-7 Results  No flowsheet data found.    DAST Results  No flowsheet data found.    Audit-C results  No flowsheet data found.      No follow-ups on file.  The above plan of care, diagnosis, orders, and follow-up were discussed with the patient.  Questions related to this recommended plan of care were answered.  Lonzo Cloud. Karolee Ohs, DO  03/22/2021    Author:  Lonzo Cloud. Thatcher Doberstein 03/22/2021 1:15 PM

## 2021-03-23 ENCOUNTER — Telehealth: Payer: PRIVATE HEALTH INSURANCE

## 2021-03-23 NOTE — Telephone Encounter
Patient called pre-operatively. Patient answered and given/confirmed the following information:     Date and check-in time for procedure:  03/24/2021 @ 0920  Responsible driver: Daughter  Mobility/ positioning restrictions: None  Medical device/s used at home: None  Blood Thinner use:  Lovenox-pt takes the medication BID, she took one dose today already.   RN stated that we usually ask that she stops the medication 24 hours in advance.  RN explained that she will talk with the MD and call her back.  Pt gave RN permission to leave a voice mail on her message machine if she does not answer.     Advised the patient to be NPO after midnight. Confirmed with patient address of facility, parking information given and waiting room restriction for visitors due to the current pandemic. Patient stated that they are ambulatory without assistance at baseline.  Informed patient of pre-procedure Covid PCR testing, and patient verbalized understanding.     Pt stated that she might have to reschedule the appointment.  RN offered to provide her with the scheduler's phone number.  Pt declined and stated that she already had the phone number.    Informed patient that procedure video  is available on MyChart as a visual informative educational material.       No further questions/concerns from patient      COVID-19 Call Pre- Procedure with [facility]      Patient answered and assessed via telephone call. Check box if patient reports any of the following signs or symptoms :    '[]'$  Fever >100F   '[]'$  Chills  '[]'$  New nausea or vomiting   '[]'$  New coughing or SOB  '[]'$  New headaches  '[]'$  New muscle aches   '[]'$  Loss of taste or smell  '[]'$  Any other flu like symptoms    '[x]'$  Pt denies any COVID-19 related symptoms           Author:  Stephens November, RN  03/23/2021 at 11:48 AM

## 2021-03-24 ENCOUNTER — Telehealth: Payer: PRIVATE HEALTH INSURANCE

## 2021-03-24 ENCOUNTER — Ambulatory Visit: Payer: PRIVATE HEALTH INSURANCE

## 2021-03-24 ENCOUNTER — Non-Acute Institutional Stay: Payer: PRIVATE HEALTH INSURANCE

## 2021-03-24 ENCOUNTER — Inpatient Hospital Stay: Payer: PRIVATE HEALTH INSURANCE

## 2021-03-24 NOTE — Progress Notes
Hematology Oncology Note    Patient name:  Alisha Orr  MRN:  [id number]  DOB:  [birth year]  CSN: [id number]  Date of encounter: 03/28/2021  Referring provider: No ref. provider found  PCP: Trena Platt., DO  Provider: Kandis Fantasia, MD        Reason for visit/cc: metastatic high grade uterine carcinoma    HPI:  History of uterine fibroids referred to oncology clinic for newly diagnosed metastatic high grade uterine cancer.  Course complicated by PE.  Patient with strong family history of cancer.  Patient was on unopposed estrogen for 10-12 years.    ?  ?  12/2020: vaginal bleeding   ?  02/07/2021: Still experiencing vaginal bleeding. ?  On exam, enlarged 20 week size uterus. ??  Pelvic US: Endometrium difficult to evaluate due to large fibroid (13 x 12 x 14 cm). Large fibroid visualized extending throughout entire uterus, possible intramural. There appears to be some fluid with the LUS along internal cervical os, wrapping along side the fibroid.   ?  02/07/21: ENDOMETRIUM (BIOPSY): [place] review  - Poorly differentiated high grade malignant neoplasm  Comment: Sections show solid sheets of a high grade necrotic tumor with small, round tumor cells showing high N/C ratio, hyperchromasia, and high mitotic activity. ?Immunohistochemically tumor cells show negative CK AE1/3 and ER, positive PAX8, P16, and cyclin D1, null-type aberrant P53, and some neuroendocrine expression (CD56). ?This uterine tumor has a broad differential diagnosis which includes undifferentiated carcinoma, high grade sarcoma, or a sarcoma component of MMMT  ?  03/09/21: PET with markedly enlarged myomatous uterus with intensely FDG-avid expansile endometrial lesion corresponding to known malignancy.?Intensely FDG avid confluent retroperitoneal and bilateral iliac chain lymphadenopathy as well as at least 3 FDG avid intrathoracic nodal metastases, and right humeral head metastasis.?Bilateral multifocal pulmonary emboli. No evidence of right heart strain, but clot burden appears significant. The urinary bladder is thickened for the degree of underdistention. Correlate with urinalysis  ?  03/09/21: presented to outside ER for evaluation of PE and discharged on lovenox  03/10/21: LE ultrasound with positive for acute partially occlusive DVT of the left posterior tibial vein. No evidence of deep or superficial venous thpombosis in the right lower extremity      Subjective:  Accompanied by daughter.  Not doing well.  Reports fatigue, sob, decreased appetite, intermittent pelvic pain.  She is compliant with lovenox.  She recently had nausea/vomiting and diarrhea and attributes this to recent antibiotic treatment.  Nausea/vomiting and diarrhea are now resolved.  She has taken zofran with good response. Reports decreased appetite, but tolerating PO.  Reports intermittent pelvic pain.  Reports vaginal bleeding, but this is overall improved.  She is undergoing naturopathic treatment with a combination of cannabinoids.  She can perform basic ADLs, but needs assistance with cleaning, preparing food and organizing meds.  She can walk about 147ft with walker.         ECOG performance status: 2-3    Objective:  Current medications:  Outpatient Medications Prior to Visit   Medication Sig   ? cefuroxime 500 mg tablet Take 1 tablet (500 mg total) by mouth.   ? cholecalciferol 125 mcg (5000 units) tablet Take 1 tablet (125 mcg total) by mouth.   ? enoxaparin 100 mg/mL injection Inject 0.95 mLs (95 mg total) under the skin.   ? ferrous sulfate 324 MG TBEC Take 65 mg by mouth.   ? ferrous sulfate 324 MG TBEC Take 1 tablet (324 mg total) by  mouth. (Patient not taking: Reported on 03/22/2021.)   ? fish oil 1000 mg capsule Take 1 capsule (1,000 mg total) by mouth. (Patient not taking: Reported on 03/22/2021.)   ? latanoprost 0.005% ophthalmic solution    ? naproxen 500 mg tablet naproxen 500 mg tablet   TAKE 1 TABLET BY MOUTH TWICE A DAY AS NEEDED (Patient not taking: Reported on 03/22/2021.)   ? ondansetron ODT 4 mg disintegrating tablet Take 1-2 tablets (4-8 mg total) by mouth every eight (8) hours as needed for Nausea or Vomiting.   ? timolol 0.5% ophthalmic solution      No facility-administered medications prior to visit.       Allergies:  Allergies   Allergen Reactions   ? Codeine Other (See Comments)     REACTION: ''out of body experience''  REACTION: ''out of body experience''  REACTION: ''out of body experience''  REACTION: ''out of body experience''     ? Latex Hives and Rash       Review of Systems:  A 14 point review of systems was negative., except as per HPI     Past Medical History:  Past Medical History:   Diagnosis Date   ? Acute deep vein thrombosis (DVT) of proximal vein of lower extremity (HCC/RAF) 03/11/2021   ? Endometrial cancer (HCC/RAF)    ? Glaucoma    ? 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 ENDOMETRIAL BIOPSY     ? EYE SURGERY      detached retina, then removal of scar tissue   ? WISDOM TOOTH EXTRACTION         Social History:  Social History     Tobacco Use   ? Smoking status: Former     Types: Cigarettes   ? Smokeless tobacco: Never   Substance Use Topics   ? Alcohol use: Not on file     Family History:  Family History   Problem Relation Age of Onset   ? Pancreatic cancer Cousin        Vitals:  BP: --  Temp: --  Temp Source: --  Heart Rate: --  Resp: --  SpO2: --  Height: --  Weight: --    Physical Exam:  General: Appears comfortable, no acute distress, fatigued  Head: Normocephalic, atraumatic.  Eyes: Without icterus.   Neck: Supple. Trachea midline.   CV: Regular in rate and rhythm  Chest: CTAB  Abdomen: Soft, nontender.  Large firm mass  Musculoskeletal:  Extremities are warm and well-perfused. +bilateral LE edema  Neurologic: Oriented to person, place and time.  Walks with walker   Hematologic: No bruising, purpura or petechiae are noted.   Dermatologic: No rashes appreciated.   Lymphatic: No palpable cervical adenopathy.  +1.5cm firm left supraclavicular lymph node   Psychiatric: Affect appropriate.  Pleasant and conversant.     Recent Labs:  Lab Results   Component Value Date    NA 136 03/22/2021    K 3.8 03/22/2021    CL 99 03/22/2021    CO2 24 03/22/2021    CREAT 0.61 03/22/2021    BUN 7 03/22/2021    GLUCOSE 109 (H) 03/22/2021    CALCIUM 9.4 03/22/2021    MG 1.8 03/22/2021     Lab Results   Component Value Date    WBC 11.39 (H) 03/22/2021  HGB 9.5 (L) 03/22/2021    HCT 31.5 (L) 03/22/2021    MCV 96.3 03/22/2021    PLT 527 (H) 03/22/2021    NEUTPCT 81.0 03/22/2021    NEUTABS 9.23 (H) 03/22/2021    MONOPCT 7.7 03/22/2021    MONOABS 0.88 (H) 03/22/2021     Lab Results   Component Value Date    TOTPRO 7.8 03/22/2021    ALBUMIN 3.5 (L) 03/22/2021    BILITOT 0.3 03/22/2021    AST 166 (H) 03/22/2021    ALT 38 03/22/2021    ALKPHOS 74 03/22/2021    AMYLASE 19 (L) 03/22/2021    LIPASE 9 (L) 03/22/2021       Impression and Recommendations:  Alisha Orr is a 66 y.o. 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  Course complicated by PE/DVT on lovenox.  Discussed diagnosis, prognosis, and treatment options.  Patient understands treatment is with palliative intent.  Discussed risks/benefits of chemotherapy.  Discussed side effects of carboplatin plus paclitaxel including but not limited to fatigue, GI toxicity, rash, peripheral neuropathy, alopecia, myelosuppression, infection, abnormal bleeding.  Patient is currently undergoing naturopathic treatment and will let me know if she would like to proceed with chemotherapy.  All questions answered.  Patient amenable to plan.       ?  -plan for neoadjuvant carboplatin plus paclitaxel   -can consider adding pembrolizumab in future, discussed that this is not SOC  -monitor CBC with diff, CMP, d-dimer                -CA125 not a good marker  -per pathology tissue necrotic. Agree with LN biopsy for MMR, Her2, NGS   -discuss with IR while on [facility]   -signed CARIS consent  -recommend bisphosphonate therapy in setting of bone mets                -recommend dental clearance.  Patient was seen recently and will call to confirm ok to proceed                 -consider palliative xrt to bone if symptomatic   -ordered IV iron per patient preference   -continue lovenox, refilled   -plan for repeat imaging after cycle 2-3  -plan for port after cycle 1  -referred to genetics  -referred to palliative care for GOC and symptom management     -patient declined GOC discussion today  -referred to [facility]  -transfusion consent signed (03/24/21)  -follow up cards, pulm  -return precautions given   ?  ?  Symptom management:  -nausea: zofran ODT prn, hydration prn  -pain: none   -diarrhea: imodium prn  -constipation: miralax prn, senna prn  ?  ?  Treatment team:  -Dr Perfecto Kingdom  -Dr Melody Comas       I spent 60 minutes counseling and coordinating care for the items below on the day of service:  -preparing to see the patient  -obtaining and or reviewing separately obtained history   -performing a medically appropriate examination or evaluation   -counseling and educating the patient/family  -ordering medications, tests, procedures  -communicating with other healthcare professionals   -documenting clinical information in the EHR         Return to clinic:  Return in about 2 weeks (around 04/07/2021).    Orders:  Orders Placed This Encounter   ? Referral to Palliative Care (Clinic or Home)   ? Referral for Authorization of Chemo Medications   ?  SIMMS [facility] Referral Order   ? enoxaparin 100 mg/mL injection         Attending Physician: Kandis Fantasia, MD.  Author: Kandis Fantasia, MD

## 2021-03-24 NOTE — Telephone Encounter
PDL Call to Clinic    Reason for Call: pt would like to inform the office she is running 15 minutes late for her appointment.     Appointment Related?  '[x]'$  Yes  '[]'$  No     If yes;  Date: 03/24/21  Time:3:00 pm    Call warm transferred to PDL: '[x]'$  Yes  '[]'$  No    Call Received by Clinic Representative: Marcie Bal    If call not answered/not accepted, call received by Patient Services Representative:

## 2021-03-25 ENCOUNTER — Telehealth: Payer: PRIVATE HEALTH INSURANCE

## 2021-03-25 DIAGNOSIS — D500 Iron deficiency anemia secondary to blood loss (chronic): Secondary | ICD-10-CM

## 2021-03-25 NOTE — Telephone Encounter
Pre procedure phone call attempted. Mailbox full.

## 2021-03-26 NOTE — Telephone Encounter
I contacted the patient at [phone]  to follow up on the Palliative Care referral. I spoke with patient and discussed the Palliative Care referral. I answered any questions or concerns regarding palliative care and offered assistance scheduling a palliative care consultation. Patient has been scheduled for the following,     Kennieth Rad MD 03/31/21 AT 2:00PM                    VIDEO VISIT       Thank you,   Rivka Safer   [employer]

## 2021-03-27 ENCOUNTER — Ambulatory Visit: Payer: PRIVATE HEALTH INSURANCE

## 2021-03-27 DIAGNOSIS — R110 Nausea: Secondary | ICD-10-CM

## 2021-03-27 DIAGNOSIS — E860 Dehydration: Secondary | ICD-10-CM

## 2021-03-27 MED ADMIN — ONDANSETRON HCL 4 MG/2ML IJ SOLN: 4 mg | INTRAVENOUS | Stop: 2021-03-27

## 2021-03-27 MED ADMIN — SODIUM CHLORIDE 0.9 % IV BOLUS: 1000 mL | INTRAVENOUS | Stop: 2021-03-28

## 2021-03-27 NOTE — Progress Notes
[facility] INTERNAL MEDICINE AND PEDIATRICS  [facility]  [address]  [place] [address]  Phone: [phone]  Fax: [phone]    IMMEDIATE CARE VISIT        Name: Alisha Orr  Medical Record Number: [id number]  Date of Birth: 1955/09/10  Date of Service: 03/27/2021  PCP: Trena Platt., DO     Patient Active Problem List   Diagnosis   ? Adenocarcinoma of endometrium (HCC/RAF)   ? Allergic rhinitis   ? BMI 32.0-32.9,adult   ? Obesity   ? Dyspareunia   ? Dysphagia   ? Disequilibrium   ? Dysphonia   ? Ear ringing   ? Endometrial cancer (HCC/RAF)   ? GERD (gastroesophageal reflux disease)   ? Hearing loss, sensorineural, asymmetrical   ? History of detached retina repair   ? Impaired hearing   ? Iron deficiency anemia, unspecified   ? Laryngopharyngeal reflux   ? Left lower quadrant pain   ? Leiomyoma of uterus, unspecified   ? 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 Menopausal symptom   ? Prediabetes   ? Secondary open-angle glaucoma   ? Temporomandibular joint disorder   ? Vitamin D deficiency   ? 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 Acute deep vein thrombosis (DVT) of proximal vein of lower extremity (HCC/RAF)   ? Iron deficiency anemia        Chief Complaint/Reason For Visit:     Chief Complaint   Patient presents with   ? Dehydration     + nausea, vomiting, diarrhea, lethargic, low energy x intermittent 2 weeks    ? Wound check Left knee x 30 mins ago        History of Present Illness:     Alisha Orr is a 66 y.o. female with and listed PMH here c/o: Nausea, vomiting for 2 weeks. She also reported an episode of diarrhea which has resolved. She has fatigue and has no energy. Patient has hx of uterine cancer and has not been able to eat or tolerate anything by mouth. Patient feels very dehydrated and fatigued. She denies     Review of Systems:   A 14 review of systems was completed. Additional symptoms were otherwise negative and/or non-contributory, except as listed above.    Past History:      Past Medical History:   Diagnosis Date   ? Acute deep vein thrombosis (DVT) of proximal vein of lower extremity (HCC/RAF) 03/11/2021   ? Endometrial cancer (HCC/RAF)    ? Glaucoma    ? 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 ENDOMETRIAL BIOPSY     ? EYE SURGERY      detached retina, then removal of scar tissue   ? WISDOM TOOTH EXTRACTION         Social History:      Social History     Socioeconomic History   ? Marital status: Other   Tobacco Use   ? Smoking status: Former     Types: Cigarettes   ? Smokeless tobacco: Never   Social History Narrative    From [place] to [place] to be close to daughter while getting treatment    Lives with daughter and 2 grandchildren  Another daughter is here to help    4 adult children      Social History     Substance and Sexual Activity   Sexual Activity Not on file       Medications:        Outpatient Medications Marked as Taking for the 03/27/21 encounter (Office Visit) with Marland Kitchen, MD:   ?  cholecalciferol 125 mcg (5000 units) tablet, Take 1 tablet (125 mcg total) by mouth.,   ?  enoxaparin 100 mg/mL injection, Inject 0.95 mLs (95 mg total) under the skin.,   ?  ferrous sulfate 324 MG TBEC, Take 65 mg by mouth.,   ?  furosemide 20 mg tablet, 1 tablet, Oral, Daily,   ?  latanoprost 0.005% ophthalmic solution, ,   ?  timolol 0.5% ophthalmic solution, ,      Allergies   Allergen Reactions   ? Codeine Other (See Comments)     REACTION: ''out of body experience''  REACTION: ''out of body experience''  REACTION: ''out of body experience''  REACTION: ''out of body experience''     ? Latex Hives and Rash        Vitals:   Vitals:    03/27/21 1539   BP: 129/89   Pulse: 78   Temp: 36.4 ?C (97.6 ?F)   TempSrc: Tympanic   SpO2: 99%   Weight: 206 lb (93.4 kg) Height: 5' 7'' (1.702 m)     BP Readings from Last 3 Encounters:   03/27/21 129/89   03/24/21 129/85   03/22/21 119/80          Physical Exam:     System Check if normal Positive or additional negative findings   Constit  []  alert and oriented, no acute distress  Patient appears very dehydrated, pale and tired.   Eyes  []  conj/lids []  pupils       HENMT  [x]  ears external [x]  otoscopy - TMs clear, normal ear canals   [x]  nose external [x]  nasal mucosa   [x]  hearing gross    [x]  lips/teeth/gums [x]  oropharynx [x]  mucus membranes    [x]  head/face     Neck  [x]  inspection [x]  palpation []  ROM []  thyroid      Resp  [x]  effort [x]  auscultation      CV  [x]  rhythm/rate   [x]  auscultation   []  JVP non-elevated    normal pulses: []  radial []  femoral  []  pedal     Chest  [x]  inspection [x]  palpation     GI  []  soft [x]  palpation (non-tender, no guarding/rebound)   []  non-distended   []  liver/spleen []  rectal  Distended abdomen, NABS, non-tender, no RT, no guarding   Back  [x]  inspection [x]  palpation []  ROM     Lymph  [x]  neck []  axillae []  groin     MSK specify site examined: upper lower extremities    []  inspect [] palpation []  ROM   []  stability []  strength/tone         Skin  []  inspection []  palpation  Dry skin   Neuro  []  CN2-12 intact grossly   []  DTR      []  muscle strength      []  sensation   []  gait/balance     Psych  []  insight/judgement     []  mood/affect    []  gross cognition           Labs and Imaging:   Medical Decision Making  In addition to  the above HPI information I have:  [x]  Reviewed/ordered []  1 []  2 [x]  ? 3 unique laboratory, radiology, and/or diagnostic tests noted below    []  Reviewed []  1 []  2 []  ? 3 prior external notes and incorporated into patient assessment    []  Discussed management or test interpretation with external provider(s) as noted      Labs Reviewed: [x]   Lab Results   Component Value Date    WBC 11.39 (H) 03/22/2021    HGB 9.5 (L) 03/22/2021    HCT 31.5 (L) 03/22/2021    MCV 96.3 03/22/2021    PLT 527 (H) 03/22/2021     Lab Results   Component Value Date    CREAT 0.61 03/22/2021    BUN 7 03/22/2021    NA 136 03/22/2021    K 3.8 03/22/2021    CL 99 03/22/2021    CO2 24 03/22/2021     Lab Results   Component Value Date    ALT 38 03/22/2021    AST 166 (H) 03/22/2021    ALKPHOS 74 03/22/2021    BILITOT 0.3 03/22/2021     Lab Results   Component Value Date    HGBA1C 5.4 03/22/2021     Lab Results   Component Value Date    TSH 1.7 03/22/2021     Lab Results   Component Value Date    CALCIUM 9.4 03/22/2021     Lab Results   Component Value Date    CHOL 155 03/22/2021    CHOLHDL 62 03/22/2021    CHOLDLCAL 70 03/22/2021    TRIGLY 114 03/22/2021       Studies Reviewed: [x]   Imaging Reviewed: []   Mammo tomosynthesis, screening, bilat breast    Result Date: 03/22/2021  [facility] HEALTH RADIOLOGY REPORT [facility] [address] [place], [place]  [address]  Patient: Christene, Pounds DOB: 1955/05/09 Age: 13 - female MRN: [id number] Referring Physician: Melody Comas ___________________________________________________________________________  Exam Date: 03/18/2021  BILATERAL SCREENING MAMMOGRAPHY WITH DIGITAL BREAST TOMOSYNTHESIS  HISTORY: Patient is 66 years old and is seen for screening.  Based on a modified Tyrer-Cuzick version 8 risk calculator, this patient has an approximate 4.1% lifetime risk of developing breast cancer. A risk score over 20% is considered high risk.  FILMS COMPARED: No prior imaging studies are available for comparison.  MAMMOGRAM TECHNIQUE: The following mammography views with 3D digital breast tomosynthesis and 2D synthetic mammography were obtained: bilateral craniocaudal with tomosynthesis and mediolateral oblique with tomosynthesis.  Computer-aided detection was utilized by the radiologist in the interpretation of this examination.  MAMMOGRAM FINDINGS:  There are scattered areas of fibroglandular densities (density B).  No suspicious masses, calcifications or other abnormalities are seen in either breast.  IMPRESSION:      There is no mammographic evidence of malignancy.  Routine screening mammogram in 1 year is recommended.  BI-RADS Category 1: Negative    Report Electronically Signed by: Jarrett Ables 03/22/2021  Technologist: Serena Colonel                EKG interpreted (by this provider): []         Assessment and Plan:        ICD-10-CM    1. Dehydration  E86.0 sodium chloride 0.9% IV soln bolus 1,000 mL      2. Nausea  R11.0 sodium chloride 0.9% IV soln bolus 1,000 mL     ondansetron 4 mg/2 mL inj 4 mg     DISCONTINUED: ondansetron 12 mg  in dextrose 5% 50 mL IVPB       1. IVF, NS 0.9%, 1 Li wide open   2. Zofran 4 mg/68ml injection in clinic.   3. Patient monitored closely. Her Sx improved with IVF and Zofran.   4.Follow up with Pulmonary/and Gyn Onc as scheduled next week.   5. Continue Zofran PRN    6. Patient and daughter understand current treatment and follow up plans and agreed with the plan.    To follow with pcp in , Immediate/urgent care return/ER precautions, pt agreed with plan.    Future Appointments   Date Time Provider Department Center   03/28/2021  9:20 AM Sarita Haver, Shelbie Ammons., MD PULM TO HAMP [place]/   03/31/2021  3:00 PM Faith Rogue, MD OBGYNONC [place]/Cen   04/01/2021  9:00 AM [facility] CT01 CTNOHO [place]   04/01/2021  9:05 AM [facility] IR US01 [facility] [place]   05/09/2021 10:30 AM Melody Comas, MD, PhD [facility]         Discussed  Discharge: patient was discharged in stable condition.  Any test or lab results will released to pt portal or by phone.  If xray ordered and if preliminary read during visit, pt will be contacted within a day if any difference in final xray report.  ER precautions education, to ER if worse symptoms discussed.  Instructions: The above diagnosis, plan of care, orders, labs, including medications if given were explained and given to patient with acknowledgement and demonstrated understanding, with all questions answered to satisfaction.  See AVS for additional information and counseling materials if provided to the patient.      Level of medical decision making based on the evaluation today:  Problem(s) addressed   []  minor/self-limited problem  []  acute uncomplicated illness/injury, or 1 stable chronic illness, or 2 or more self-limited/minor problems  [x]  acute systemic illness or complicated injury or 1 new undiagnosed problem with uncertain prognosis or > 2 stable chronic illness or >1 chronic illness with exacerbation/progression/se of tx  []  acute or chronic illness/injury that poses threat to life or bodily function or > 1 chronic illness with severe exacerbation/progression/or  se of tx    Risk of complication   []  Minimal  []  Low  [x]  Moderate  []  High       Author:  Marland Kitchen MD

## 2021-03-28 ENCOUNTER — Ambulatory Visit: Payer: PRIVATE HEALTH INSURANCE

## 2021-03-28 ENCOUNTER — Non-Acute Institutional Stay: Payer: PRIVATE HEALTH INSURANCE

## 2021-03-28 ENCOUNTER — Telehealth: Payer: PRIVATE HEALTH INSURANCE

## 2021-03-28 DIAGNOSIS — I824Y9 Acute embolism and thrombosis of unspecified deep veins of unspecified proximal lower extremity: Secondary | ICD-10-CM

## 2021-03-28 DIAGNOSIS — R0609 Other forms of dyspnea: Secondary | ICD-10-CM

## 2021-03-28 DIAGNOSIS — E860 Dehydration: Secondary | ICD-10-CM

## 2021-03-28 DIAGNOSIS — I2699 Other pulmonary embolism without acute cor pulmonale: Secondary | ICD-10-CM

## 2021-03-28 DIAGNOSIS — Z87891 Personal history of nicotine dependence: Secondary | ICD-10-CM

## 2021-03-28 MED ORDER — LEVALBUTEROL TARTRATE 45 MCG/ACT IN AERO
2 | Freq: Four times a day (QID) | RESPIRATORY_TRACT | 3 refills | Status: AC | PRN
Start: 2021-03-28 — End: ?

## 2021-03-28 MED ORDER — ENOXAPARIN SODIUM 100 MG/ML IJ SOSY
95 mg | Freq: Two times a day (BID) | SUBCUTANEOUS | 1 refills | Status: AC
Start: 2021-03-28 — End: ?

## 2021-03-28 NOTE — Progress Notes
PATIENT: Alisha Orr  MRN: [id number]  DOB: March 15, 1955  DATE OF SERVICE: 03/28/2021    CHIEF COMPLAINT:   Chief Complaint   Patient presents with   ? Consult For     PE        HPI   Alisha Orr is a 66 y.o. female ex smoker from age [age] (1/2 ppd) with past medical history of newly diagnosed metastatic high grade uterine cancer (not on treatment yet), recently diagnosed acute deep vein thrombosis (DVT) of proximal vein of lower extremity (HCC/RAF) (03/11/2021), complicated by PULMONARY EMBOLISM here to establish care    Had PET CT scan on 03/10/21 with Iv contrast which incidentally picked up bilat multifocal PULMONARY EMBOLISM. She was referred to ED and was also found to have left DVT. She was discharged to home on treatment doss of lovenox. Still feels winded but also dealing with a lot of muscle pain, weakness, nausea. Has an upcoming appt for US guided LN biopsy. Denies cough.   Doesn't like the lovenox injections. She reports that it is very painful.    +ve family hx of asthma. Sister and daughter has asthma    I reviewed these specialist notes that directly relate to the patient's acute and/or chronic medical problems with documentation of the salient findings, if AVW:UJWJ Alisha Blakes, MD in Medicine, Hematology & Oncology on 03/24/2021.}    MEDS     Outpatient Medications Prior to Visit   Medication Sig   ? cefuroxime 500 mg tablet Take 1 tablet (500 mg total) by mouth.   ? cholecalciferol 125 mcg (5000 units) tablet Take 1 tablet (125 mcg total) by mouth.   ? enoxaparin 100 mg/mL injection Inject 0.95 mLs (95 mg total) under the skin.   ? ferrous sulfate 324 MG TBEC Take 65 mg by mouth.   ? ferrous sulfate 324 MG TBEC Take 1 tablet (324 mg total) by mouth. (Patient not taking: Reported on 03/22/2021.)   ? fish oil 1000 mg capsule Take 1 capsule (1,000 mg total) by mouth. (Patient not taking: Reported on 03/22/2021.)   ? furosemide 20 mg tablet Take 1 tablet (20 mg total) by mouth daily.   ? latanoprost 0.005% ophthalmic solution    ? naproxen 500 mg tablet naproxen 500 mg tablet   TAKE 1 TABLET BY MOUTH TWICE A DAY AS NEEDED (Patient not taking: Reported on 03/22/2021.)   ? ondansetron ODT 4 mg disintegrating tablet Take 1-2 tablets (4-8 mg total) by mouth every eight (8) hours as needed for Nausea or Vomiting.   ? timolol 0.5% ophthalmic solution      No facility-administered medications prior to visit.        PHYSICAL EXAM      Last Recorded Vital Signs:    03/28/21 0933   BP: 136/85   Pulse: 84   Resp: 18   SpO2: 98%     Body mass index is 32.73 kg/m?.     Walking oximetry eval  03/28/21  O2 sat on RA at rest: 98%; HR: 84  O2 sat on RA with ambulation: 100%; HR 117. Stopped after 3 mins because of weakness and pain    Physical Exam  Constitutional:       Appearance: Normal appearance.   HENT:      Head: Normocephalic and atraumatic.   Cardiovascular:      Rate and Rhythm: Normal rate and regular rhythm.      Heart sounds: Normal heart sounds.   Pulmonary:  Effort: Pulmonary effort is normal.      Breath sounds: Normal breath sounds.   Musculoskeletal:      Right lower leg: Edema present.      Left lower leg: Edema present.   Neurological:      Mental Status: She is alert.          LABS/STUDIES   I have:   [x]  Reviewed/ordered []  1 []  2 [x]  ? 3 unique laboratory, radiology, and/or diagnostic tests noted below    []  Reviewed []  1 []  2 []  ? 3 prior external notes and incorporated into patient assessment    []  Discussed management or test interpretation with external provider(s) as     Labs:  Lab Results   Component Value Date    WBC 11.39 (H) 03/22/2021    HGB 9.5 (L) 03/22/2021    MCV 96.3 03/22/2021    PLT 527 (H) 03/22/2021       No results found for: BNP    Microbiology data:    Pathology Data:    Imaging Studies:   PET CT  03/09/21  IMPRESSION:   1.  Markedly enlarged myomatous uterus with intensely FDG-avid expansile endometrial lesion corresponding to known malignancy.  2.  Intensely FDG avid confluent retroperitoneal and bilateral iliac chain lymphadenopathy as well as at least 3 FDG avid intrathoracic nodal metastases, and right humeral head metastasis.  3.  Bilateral multifocal pulmonary emboli. No evidence of right heart strain, but clot burden appears significant.  4.  The urinary bladder is thickened for the degree of underdistention. Correlate with urinalysis.      XR chest pa+lat (2 views)    Result Date: 03/07/2021  IMPRESSION: No previous studies are available for comparison. Cardiomediastinal contour is unremarkable. No congestion, focal pneumonic consolidation, atelectatic opacity of significance or pneumothorax. No acute focal osseous abnormality identified. Signed by: Sherolyn Buba   03/07/2021 4:18 PM       Pulmonary Function tests:    Sleep studies:    Cardiac studies:  No results found.       A&P   Alisha Orr is a a 66 y.o. female presenting for:    ASSESSMENT/PLAN  * DVT c/b PULMONARY EMBOLISM, without RV strain, diagnosed 02/2021  - continue treatment dose of lovenox for now  - has an upcoming appt for US guided of the LN. Advised her to contact radiology to ensure that biopsy can be performed on therapeutic dose of lovenox. Given that she has been on treatment dose of lovenox for less than 6 week, will not recommend holding lovenox dose for more than 1 day  - may need life long anticoagulation  - can consider switching her to eliquis once all diagnostic biopsy procedures are completed  - Echo in 3 months to r/o pulm HTN    * DOE, ex smoker, +ve family hx of asthma  - PFT  - xopenex 2 puffs q6h as needed    * new diagnosed with metastatic endometrial ca  - management per heme/onc    The above recommendation were discussed with the patient.  The patient has all questions answered satisfactorily and is in agreement with this recommended plan of care.    F/up: 6 weeks        Author:  Diannia Ruder 03/28/2021 9:46 AM

## 2021-03-28 NOTE — Telephone Encounter
Patient called pre procedure. Spoke with Katharine Look. Confirmed biopsy date and time of procedure. Told the patient will have to be NPO after midnight. Confirmed with patient address of facility and parking information given. Lab results from 03/22/2021. The patient is ambulatory without assistance at baseline and confirmed that they have a responsible adult to drive them from the facility after the procedure. No further questions/concerns from patient.

## 2021-03-28 NOTE — Patient Instructions
-   schedule PFT at [facility] office. Please do it at the front desk   - call [phone] to schedule echo at [facility] radiology in [place] office  - use xopenex 2 pufs every 6 hours as needed for SOB

## 2021-03-29 NOTE — Addendum Note
Addended by: Claudie Revering HABIB on: 03/28/2021 05:02 PM     Modules accepted: Level of Service

## 2021-03-30 ENCOUNTER — Telehealth: Payer: PRIVATE HEALTH INSURANCE

## 2021-03-30 ENCOUNTER — Ambulatory Visit: Payer: PRIVATE HEALTH INSURANCE

## 2021-03-31 ENCOUNTER — Ambulatory Visit: Payer: PRIVATE HEALTH INSURANCE

## 2021-03-31 ENCOUNTER — Telehealth: Payer: PRIVATE HEALTH INSURANCE

## 2021-03-31 ENCOUNTER — Inpatient Hospital Stay: Payer: PRIVATE HEALTH INSURANCE

## 2021-03-31 NOTE — Consults
OUTPATIENT PALLIATIVE CARE INITIAL CONSULT   MRN: [id number] CSN: [id number]   03/31/2021    Referring Provider: Dr. Genelle Bal  Reason for Consult: Goals of care discussion/ACP, Pain management and Other symptom management  Primary Diagnosis leading to Consult: Cancer (solid tumor)     Patient Consent to Telehealth   The patient agreed to participate in the video visit prior to joining the visit.      HISTORY OF PRESENT ILLNESS  Alisha Orr is a 66 y.o. female ***    Reviewed Notes  I reviewed these specialist notes that directly relate to the patient's acute and/or chronic medical problems and incorporated them into the patient assessment  ***    #Psychosocial:  -Who do you live with? ***  -What do you do? ***  -What brings you joy? ***  -Spirituality: ***      Symptom Assessment  Pain: {Severity:28766::''patient was not assessed''}  Anxiety: {Severity:28766::''patient was not assessed''}  Nausea: {Severity:28766::''patient was not assessed''}  Dyspnea:{Severity:28766::''patient was not assessed''}  Bowel Movements: ***    24hr Oral Morphine Equivalent: ***    Current Outpatient Medications   Medication Sig   ? cefuroxime 500 mg tablet Take 1 tablet (500 mg total) by mouth.   ? cholecalciferol 125 mcg (5000 units) tablet Take 1 tablet (125 mcg total) by mouth.   ? enoxaparin 100 mg/mL injection Inject 0.95 mLs (95 mg total) under the skin.   ? enoxaparin 100 mg/mL injection Inject 0.95 mLs (95 mg total) under the skin two (2) times daily.   ? ferrous sulfate 324 MG TBEC Take 65 mg by mouth.   ? ferrous sulfate 324 MG TBEC Take 1 tablet (324 mg total) by mouth. (Patient not taking: Reported on 03/22/2021.)   ? fish oil 1000 mg capsule Take 1 capsule (1,000 mg total) by mouth. (Patient not taking: Reported on 03/22/2021.)   ? furosemide 20 mg tablet Take 1 tablet (20 mg total) by mouth daily.   ? latanoprost 0.005% ophthalmic solution    ? levalbuterol (XOPENEX HFA) 45 mcg/act inhaler Inhale 2 puffs every six (6) hours as needed for Wheezing.   ? naproxen 500 mg tablet naproxen 500 mg tablet   TAKE 1 TABLET BY MOUTH TWICE A DAY AS NEEDED (Patient not taking: Reported on 03/22/2021.)   ? ondansetron ODT 4 mg disintegrating tablet Take 1-2 tablets (4-8 mg total) by mouth every eight (8) hours as needed for Nausea or Vomiting.   ? timolol 0.5% ophthalmic solution      Current Facility-Administered Medications   Medication Dose Route Frequency   ? [COMPLETED] ondansetron 4 mg/2 mL inj 4 mg  4 mg IV Push Once   ? [COMPLETED] sodium chloride 0.9% IV soln bolus 1,000 mL  1,000 mL Intravenous Once   ? [DISCONTINUED] ondansetron 12 mg in dextrose 5% 50 mL IVPB  12 mg Intravenous Once       OPIOID SAFETY   Review of CURES Database  ***    Opioid Risk Tool  Mark each box that applies  Female  Female    Family history of substance abuse    Alcohol  1  3    Illegal drugs  2  3    Rx drugs  4  4    Personal history of substance abuse    Alcohol  3  3    Illegal drugs  4  4    Rx drugs  5  5    Age between 16--45  years  1  1    History of preadolescent sexual abuse  3  0    Psychological disease    ADD, OCD, bipolar, schizophrenia  2  2    Depression  1  1    Scoring totals      A score of 3 or lower indicates low risk for future opioid abuse, a score of 4 to 7 indicates moderate risk for opioid abuse, and a score of 8 or higher indicates a high risk for opioid abuse.    {Risk Level:37561}    ALLERGIES  Codeine and Latex    PHYSICAL EXAM  There were no vitals taken for this visit.  Wt Readings from Last 3 Encounters:   03/28/21 209 lb (94.8 kg)   03/27/21 206 lb (93.4 kg)   03/24/21 206 lb (93.4 kg)      General: {GEN:29103::''alert'',''awake'',''no acute distress''}  Head/Ears/Eyes/Nose: {HEEN:29106::''normocephalic'',''atraumatic'',''hearing intact'',''sclera anicteric''}  Lungs: {LUNGS:29122::''regular breathing pattern'',''no use of accessory muscles'',''no cyanosis''}  GI: {ABDOMEN:29123::''soft'',''non-distended'',''non-tender''}  Skin: {SKIN:29164::''non-diaphoretic'',''no visible lesions'',''no visible rashes ''}  Neuro: {NEURO:29124::''moving all extremities'',''no focal deficits'',''making eye contact ''}  Psych: {Psych:29166::''pleasant'',''cooperative'',''engaging in conversation''}    LABS AND STUDIES  I have:   []  Reviewed/ordered []  1 []  2 []  ? 3 unique laboratory, radiology, and/or diagnostic tests noted below    []  Discussed management or test interpretation with external provider(s) as noted       Lab Studies:  Results for orders placed or performed in visit on 03/22/21   CBC   Result Value Ref Range    White Blood Cell Count 11.39 (H) 4.16 - 9.95 x10E3/uL    Red Blood Cell Count 3.27 (L) 3.96 - 5.09 x10E6/uL    Hemoglobin 9.5 (L) 11.6 - 15.2 g/dL    Hematocrit 91.4 (L) 34.9 - 45.2 %    Mean Corpuscular Volume 96.3 79.3 - 98.6 fL    Mean Corpuscular Hemoglobin 29.1 26.4 - 33.4 pg    MCH Concentration 30.2 (L) 31.5 - 35.5 g/dL    Red Cell Distribution Width-SD 51.6 (H) 36.9 - 48.3 fL    Red Cell Distribution Width-CV 14.7 11.1 - 15.5 %    Platelet Count, Auto 527 (H) 143 - 398 x10E3/uL    Mean Platelet Volume 9.9 9.3 - 13.0 fL    Nucleated RBC%, automated 0.0 No Ref. Range %    Absolute Nucleated RBC Count 0.00 0.00 - 0.00 x10E3/uL    Neutrophil Abs (Prelim) 9.23 See Absolute Neut Ct. x10E3/uL   Differential, Automated   Result Value Ref Range    Neutrophil Percent, Auto 81.0 No Ref. Range %    Lymphocyte Percent, Auto 9.6 No Ref. Range %    Monocyte Percent, Auto 7.7 No Ref. Range %    Eosinophil Percent, Auto 0.8 No Ref. Range %    Basophil Percent, Auto 0.5 No Ref. Range %    Immature Granulocytes% 0.4 No Reference Range %    Absolute Neut Count 9.23 (H) 1.80 - 6.90 x10E3/uL    Absolute Lymphocyte Count 1.09 (L) 1.30 - 3.40 x10E3/uL    Absolute Mono Count 0.88 (H) 0.20 - 0.80 x10E3/uL    Absolute Eos Count 0.09 0.00 - 0.50 x10E3/uL    Absolute Baso Count 0.06 0.00 - 0.10 x10E3/uL    Absolute Immature Gran Count 0.04 0.00 - 0.04 x10E3/uL   CBC & Auto Differential    Narrative    The following orders were created for panel order CBC & Auto Differential.  Procedure  Abnormality         Status                     ---------                               -----------         ------                     MVH[[id number]]                          Abnormal            Final result               Differential, Automated[[id number]]      Abnormal            Final result                 Please view results for these tests on the individual orders.        Results for orders placed or performed in visit on 03/22/21   Comprehensive Metabolic Panel   Result Value Ref Range    Sodium 136 135 - 146 mmol/L    Potassium 3.8 3.6 - 5.3 mmol/L    Chloride 99 96 - 106 mmol/L    Total CO2 24 20 - 30 mmol/L    Anion Gap 13 8 - 19 mmol/L    Glucose 109 (H) 65 - 99 mg/dL    Creatinine 8.41 3.24 - 1.30 mg/dL    Estimated GFR >40 See GFR Additional Information mL/min/1.47m2    GFR Additional Information See Comment     Urea Nitrogen 7 7 - 22 mg/dL    Calcium 9.4 8.6 - 10.2 mg/dL    Total Protein 7.8 6.1 - 8.2 g/dL    Albumin 3.5 (L) 3.9 - 5.0 g/dL    Bilirubin,Total 0.3 0.1 - 1.2 mg/dL    Alkaline Phosphatase 74 37 - 113 U/L    Aspartate Aminotransferase 166 (H) 13 - 62 U/L    Alanine Aminotransferase 38 8 - 70 U/L       Results for orders placed or performed in visit on 03/22/21   ECG 12-Lead Clinic Performed    Narrative    See scanned tracing or encounter note for the interpretation.       No results found for requested labs within last 90 days.       Imaging Studies:   .[id number][[id number] .[id number][IMG784    .[id number][IMG5602 .[id number][IMG8858 .[id number][IMG8500   Results for orders placed or performed during the hospital encounter of 03/09/21   PETCT with Diagnostic CT of the neck-chest-abd-pelvis w/IV contrast; FDG    Narrative    [facility]    PET CT BODY WITH DIAGNOSTIC CT OF THE NECK CHEST ABD PELVIS W/IV CONTRAST   03/09/2021    ORDER INFORMATION PER REFERRING PROVIDER: patient with uterine cancer.   please assess extent of disease.     CLINICAL HISTORY PER EMR: 66 yo female with newly diagnosed endometrial   adenocarcinoma.    COMPARISON: None.    REFERRING PHYSICIAN: Melody Comas, M.D.    TECHNIQUE:  Imaging was performed on a Siemens Multidetector PET CT Scanner. A single   volume non-contrast CT of the upper abdomen was acquired in quiet   breathing. After the administration of  intravenous contrast, volumetric CT   scan of the chest was obtained in full   suspended inspiration, and reconstructed at 1 mm and 3 mm. Thereafter,   volumetric CT was acquired from the base of the skull to the   proximal-thighs during shallow breathing at 3 mm. After a standard uptake   period following radiotracer injection, PET   images were acquired, using multiple positions along the same length of   the patient's body as the CT. Iterative methods were used to reconstruct   the PET images with a slice thickness of 5 mm. The CT images were used for   attenuation correction. Oral   contrast was administered prior to the examination.     Contrast and Radiopharmaceutical: 18-F FDG inj 13.7 millicurie barium   sulfate (Readi-Cat 2) 2.1% susp 450 mL iohexol (Omnipaque) 350 mg/mL inj   90 mL  FDG uptake interval: 55 minutes  Serum glucose: 111 mg/dL    RADIATION DOSE: CTDI 45.08, DLP 2545    PET:  Physiologic FDG uptake is noted in the visualized portions of the brain,   salivary gland tissues, myocardium, the abdominal solid organs,   gastrointestinal tract, both kidneys, ureters and the urinary bladder.    For SUV reference:  SUVmean/max liver 2.4/3.1 (3-116)  SUVmean/max mediastinal blood pool (measured at the descending thoracic   aorta) 2.28/3.2 (3-68)      ONCOLOGIC FINDINGS:    History of biopsy-proven endometrial adenocarcinoma with:      - Enlarged myomatous uterus with multiple fibroids, the largest a   submucosal anterior body fibroid measuring 10 x 14.4 cm, with an expansile   heterogeneous intensely FDG avid endometrial mass, corresponding to the   known malignancy;      -Intensely FDG avid lymphadenopathy including multiple intrathoracic nodes   (left supraclavicular, left upper paratracheal and paraesophageal) and   confluent retroperitoneal and bilateral iliac chain lymph nodes    -Intensely FDG avid focus in the right humeral head measuring 14 x 18 mm   (6-71, SUV max 13.4).    -Mild perihepatic ascites and presacral fluid stranding without definite   peritoneal nodularity.      TARGET LESIONS:    -Right external iliac lymph node: 24 x 36 mm (6-303, SUV max 12.9).    -Left para-aortic lymph node conglomerate: 44 x 53 mm (6-230, SUV max   15.7).      ADDITIONAL FINDINGS:    BASE OF HEAD AND NECK:    Mucosal retention cyst in the left maxillary sinus.  Left thyroid nodule.    CHEST:  Lungs: Punctate subpleural pulmonary nodules in the right upper lobe   (1-610) and the left lower lobe (8-246).  Pleura: Unremarkable.  Cardiovascular: Extensive filling defects in the main and all bilateral   lobar pulmonary artery branches. No definite CT findings of right heart   strain.    ABDOMEN/PELVIS:   Liver: Unremarkable.  Gallbladder and bile ducts: Unremarkable.  Spleen, pancreas, adrenals: Unremarkable.  Kidneys and ureters: Unremarkable.  Bowel: Unremarkable. Normal appendix.  Bladder: Underdistended with circumferential wall thickening out of   proportion to degree of distention.  Vessels: IVC compressed by large expansile uterus.  Peritoneum: No masses.  Abdominal wall: Unremarkable.    MUSCULOSKELETAL:  See above.        Impression       1.  Markedly enlarged myomatous uterus with intensely FDG-avid expansile   endometrial lesion corresponding to known malignancy.  2.  Intensely FDG avid confluent retroperitoneal  and bilateral iliac chain   lymphadenopathy as well as at least 3 FDG avid intrathoracic nodal   metastases, and right humeral head metastasis.  3.  Bilateral multifocal pulmonary emboli. No evidence of right heart   strain, but clot burden appears significant.  4.  The urinary bladder is thickened for the degree of underdistention.   Correlate with urinalysis.    Findings were discussed with Dr. Lafonda Mosses via phone at 11:44 AM.      Thank you for allowing [facility] to assist in the care of this   patient.     This study has been reviewed jointly by Radiologist Dr. Alexia Freestone and   Nuclear Medicine Physician Dr. Lamonte Richer The above constitutes a   synthesis of both the CT and PET findings and the physicians have reviewed   and are in agreement with the report.    Dictated by: Danella Penton   03/09/2021 11:46 AM            FUTURE VISITS   Future Appointments   Date Time Provider Department Center   03/31/2021  3:00 PM Faith Rogue, MD OBGY[facility] [place]/Cen   04/01/2021  9:00 AM [facility] CT01 [facility] [place]   04/01/2021  9:05 AM [facility] IR US01 [facility]   04/13/2021  3:00 PM TO PULM PFT PULM TO HAMP [place]/   Apr 29, 2021  9:20 AM Diannia Ruder., MD PULM TO HAMP [place]/   05/09/2021 10:30 AM Melody Comas, MD, PhD [facility] /Cen       IMPRESSION  Nolita Kutter is a 66 y.o. female ***    #Metastatic high-grade uterine cancer, with mets to LN and right humeral head. Plan for neoadjuvant Carboplatin/Paclitaxel. Followed by Dr. Genelle Bal.   #Bilateral PE/DVT, on Lovenox     #ACP/GOC:  -Surrogate:   -Px Awareness:   -Values/Goals:   -Tx Preferences:        RECOMMENDATIONS:    ***  -----      *** minutes were spent personally by me today on this encounter which may include today's pre-visit review of the chart, time spent during the visit, and today's time spent after the visit documenting and coordinating care    Electronically signed by:    Faith Rogue, MD  Palliative Care  03/31/2021

## 2021-03-31 NOTE — Telephone Encounter
Appointment Accommodation Request      Appointment Type:  Return. Daughter is stating that the pt. Can not eat and it vomiting everything up.  Patient is dehydrated.      Reason for sooner request: Uterine cancer-stage 4     Date/Time Requested (If any): asap    Last seen by MD: 03/15/21    Any Symptoms:  '[]'$  Yes  '[x]'$  No       If yes, what symptoms are you experiencing:   o Duration of symptoms (how long):     Patient or caller was offered an appointment but declined.    Patient or caller was advised to seek emergency services if conditions are urgent or emergent.    Patient or caller has been notified of the turnaround time of 1-2 business (days).

## 2021-03-31 NOTE — Telephone Encounter
Spoke with Alisha Orr. Confirmed that she is to continue taking her Lovenox and not hold for the procedure.

## 2021-03-31 NOTE — Telephone Encounter
Hi all,     Please see below encounter.     Patient would like to express these concerns to Dr. Raymon Mutton or PA. Daughter also says her mothers tumor is growing and is concerned. Is someone able to call her, or should I schedule a in clinic appointment for patient?

## 2021-03-31 NOTE — Telephone Encounter
Outgoing call to [place].  Ms. Alisha Orr, her mother, with advanced endometrial cancer.  She states her mother is becoming very uncomfortable due to rapidly worsening distension.  Reports her mother is not able to keep food or drink down.  Having occasional BM's but they're not solid and mostly full of mucous.  Recommend bringing her to [facility] ER to r/o possible obstruction and provide fluids if dehydrated.      [name] also wants to discuss the treatment plan again with Dr. Raymon Mutton and Dr. Domenic Polite.  Will ask them to reach out.

## 2021-04-01 ENCOUNTER — Non-Acute Institutional Stay: Payer: PRIVATE HEALTH INSURANCE

## 2021-04-01 ENCOUNTER — Telehealth: Payer: PRIVATE HEALTH INSURANCE

## 2021-04-01 ENCOUNTER — Inpatient Hospital Stay: Payer: PRIVATE HEALTH INSURANCE

## 2021-04-01 ENCOUNTER — Ambulatory Visit: Payer: PRIVATE HEALTH INSURANCE | Attending: Student in an Organized Health Care Education/Training Program

## 2021-04-01 DIAGNOSIS — Z87440 Personal history of urinary (tract) infections: Secondary | ICD-10-CM

## 2021-04-01 DIAGNOSIS — R112 Nausea with vomiting, unspecified: Secondary | ICD-10-CM

## 2021-04-01 DIAGNOSIS — E860 Dehydration: Secondary | ICD-10-CM

## 2021-04-01 MED ORDER — OXYCODONE HCL 5 MG PO TABS
2.5 mg | ORAL_TABLET | Freq: Four times a day (QID) | ORAL | 0 refills | Status: AC | PRN
Start: 2021-04-01 — End: ?

## 2021-04-01 MED ADMIN — SODIUM CHLORIDE 0.9 % IV BOLUS: 1000 mL | INTRAVENOUS | @ 19:00:00 | Stop: 2021-04-01

## 2021-04-01 MED ADMIN — SODIUM CHLORIDE 0.9 % IV BOLUS: 1000 mL | INTRAVENOUS | @ 20:00:00 | Stop: 2021-04-01

## 2021-04-01 NOTE — ED Notes
Called for Q2 RN reassessment, no answer.

## 2021-04-01 NOTE — ED Notes
Called x2 for Q2 RN reassessment.

## 2021-04-01 NOTE — ED Notes
Called x3 for RN reassessment, no answer.

## 2021-04-01 NOTE — Progress Notes
[facility] INTERNAL MEDICINE & PEDIATRICS  [facility]  [address]  [address]  [place] [address]  Phone: [phone]  FAX: [phone]  [phone]    URGENT CARE - ADULT    Subjective:     CC: Nausea (A few days now. /) and Diarrhea    Patient Active Problem List   Diagnosis   ? Adenocarcinoma of endometrium (HCC/RAF)   ? Allergic rhinitis   ? BMI 32.0-32.9,adult   ? Obesity   ? Dyspareunia   ? Dysphagia   ? Disequilibrium   ? Dysphonia   ? Ear ringing   ? Endometrial cancer (HCC/RAF)   ? GERD (gastroesophageal reflux disease)   ? Hearing loss, sensorineural, asymmetrical   ? History of detached retina repair   ? Impaired hearing   ? Iron deficiency anemia, unspecified   ? Laryngopharyngeal reflux   ? Left lower quadrant pain   ? Leiomyoma of uterus, unspecified   ? 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 Menopausal symptom   ? Prediabetes   ? Secondary open-angle glaucoma   ? Temporomandibular joint disorder   ? Vitamin D deficiency   ? 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 Acute deep vein thrombosis (DVT) of proximal vein of lower extremity (HCC/RAF)   ? Iron deficiency anemia   ? Dehydration     HPI:   Alisha Orr is a 66 y.o. female with the above issues here with nausea x 2 weeks that has worsened. Minimal vomiting and diarrhea. Has decreased PO intake but able to tolerate some juices. Was seen here in urgent care earlier this week for similar sx. Denies fever, SOB, chest pain, blood in stool, dysuria, hematuria. Is feeling more fatigued. Recently diagnosed with metastatic cancer.    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    Medications/Supplements  Medications that the patient states to be currently taking   Medication Sig   ? enoxaparin 100 mg/mL injection Inject 0.95 mLs (95 mg total) under the skin.   ? enoxaparin 100 mg/mL injection Inject 0.95 mLs (95 mg total) under the skin two (2) times daily.   ? ferrous sulfate 324 MG TBEC Take 65 mg by mouth.   ? furosemide 20 mg tablet Take 1 tablet (20 mg total) by mouth daily.   ? latanoprost 0.005% ophthalmic solution    ? levalbuterol (XOPENEX HFA) 45 mcg/act inhaler Inhale 2 puffs every six (6) hours as needed for Wheezing.   ? naproxen 500 mg tablet    ? ondansetron ODT 4 mg disintegrating tablet Take 1-2 tablets (4-8 mg total) by mouth every eight (8) hours as needed for Nausea or Vomiting.   ? timolol 0.5% ophthalmic solution      Current Facility-Administered Medications for the 04/01/21 encounter (Office Visit) with Dimas Chyle, DO   Medication   ? [COMPLETED] ondansetron 4 mg/2 mL inj 4 mg   ? [COMPLETED] sodium chloride 0.9% IV soln bolus 1,000 mL   ? [COMPLETED] sodium chloride 0.9% IV soln bolus 1,000 mL   ? [DISCONTINUED] ondansetron 12 mg in dextrose 5% 50 mL IVPB  Objective:     Physical Exam  BP 101/71  ~ Pulse 99  ~ Temp 36.6 ?C (97.8 ?F)  ~ Resp 15  ~ Ht 5' 7'' (1.702 m)  ~ Wt 203 lb (92.1 kg)  ~ SpO2 97%  ~ BMI 31.79 kg/m?     General: dehydrated and fatigued  Head: Atraumatic, normocephalic  Lungs: normal work of breathing.  Abdomen: soft, nontender, nondistended, no masses or organomegaly  Psych:  Normal affect, insight, and judgment.    Assessment/Plan:     1. Nausea and vomiting, unspecified vomiting type  - sodium chloride 0.9% IV soln bolus 1,000 mL    2. History of UTI  - POCT urinalysis dipstick; Future  - POCT urinalysis dipstick    3. Dehydration    1. Nausea and vomiting   2. Dehydration  - patient declined zofran at this time given she recently took it at home  - given 1L NS bolus in clinic with some improvement in energy   - vitals are stable   - zofran prn   - strict return precautions discussed including worsening abdominal pain, vomiting, diarrhea, blood in stool, fever, SOB, chest pain, or any other worsening sx     2. History of UTI   - daughter requesting repeat UA after recent UTI   - asymptomatic   - patient unable to give urine sample at this time     The above plan of care, diagnosis, orders, and follow-up were discussed with the patient.  Questions related to this recommended plan of care were answered.    Dimas Chyle, DO  04/01/2021 at 6:17 PM

## 2021-04-01 NOTE — Telephone Encounter
Called patient and spoke to her daughter.  Patient is feeling better.  She went to urgent care and underwent IV hydration with improvement.  Nausea is now controlled.  Patient is tolerating PO.  IV hydration authorization in infusion center is pending.  Reports BM and flatus.  Does reports some pain in hip.  She denies codeine allergy.  Prescribed oxycodone 2.'5mg'$  prn.  Counseled on safety, side effects, and constipation.  Patient will establish care with Dr Glendell Docker.  ER precautions given.  All questions answered.  Patient amenable to plan.  Avanell Shackleton

## 2021-04-01 NOTE — ED Notes
COLLECTIVE?NOTIFICATION?03/31/2021 17:30?Alisha Orr?MRN: [id number]    Criteria Met      2 Visits in 30 Days    Security and Safety  No Security Events were found.  ED Care Guidelines  There are currently no ED Care Guidelines for this patient. Please check your facility's medical records system.          Prescription Drug Report (12 Mo.)  PDMP query found no report.    E.D. Visit Count (12 mo.)  Facility Visits   Alisha Orr Alisha Orr 2   [facility] 1   Total 3   Note: Visits indicate total known visits.     Recent Emergency Department Visit Summary  Date Facility [facility] Type Diagnoses or Chief Complaint    Mar 31, 2021  Alisha Orr  Emergency     Mar 09, 2021  [facility] [place].  [place]  Emer[place]      per pt has blood clot in her lungs.      Shortness of Breath      Other pulmonary embolism without acute cor pulmonale      Mar 09, 2021  Alisha Orr [facility]  [place]  Emergency      1. Shortness of Breath        Recent Inpatient Visit Summary  Date Facility [facility] Type Diagnoses or Chief Complaint    Mar 09, 2021  [facility]  Alisha Orr.  [place]  Progressive Care      Other pulmonary embolism without acute cor pulmonale      Urinary tract infection, site not specified        Care Team  No Care Team was found.  This patient has registered at the [facility]   For more information visit: [email or web address]   PLEASE NOTE:     1.   Any care recommendations and other clinical information are provided as guidelines or for historical purposes only, and providers should exercise their own clinical judgment when providing care.    2.   You may only use this information for purposes of treatment, payment or health care operations activities, and subject to the limitations of applicable Collective Policies.    3.   You should consult directly with the organization that provided a care guideline or other clinical history with any questions about additional information or accuracy or completeness of information provided.    ? 2023 [place] - [email or web address]

## 2021-04-02 NOTE — Nursing Note
Hydration infusion, starting time 1140 to 1242. Pt tolerated infusion well with no complaints. No adverse reactions noted. NAD.Pt Discharged via W/C and daughter at bedside.

## 2021-04-03 ENCOUNTER — Ambulatory Visit: Payer: PRIVATE HEALTH INSURANCE

## 2021-04-04 ENCOUNTER — Inpatient Hospital Stay
Admit: 2021-04-04 | Discharge: 2021-04-05 | Disposition: A | Discharge status: Home / Self Care | Payer: PRIVATE HEALTH INSURANCE | Source: Ambulatory Visit

## 2021-04-04 ENCOUNTER — Non-Acute Institutional Stay: Payer: PRIVATE HEALTH INSURANCE

## 2021-04-04 DIAGNOSIS — R109 Unspecified abdominal pain: Secondary | ICD-10-CM

## 2021-04-04 DIAGNOSIS — C541 Malignant neoplasm of endometrium: Secondary | ICD-10-CM

## 2021-04-04 LAB — Prothrombin Time Panel: PROTHROMBIN TIME: 14.8 s — ABNORMAL HIGH (ref 11.5–14.4)

## 2021-04-04 LAB — CBC: MCH CONCENTRATION: 30.6 g/dL — ABNORMAL LOW (ref 31.5–35.5)

## 2021-04-04 LAB — Differential Automated: EOSINOPHIL PERCENT, AUTO: 0 (ref 0.00–0.10)

## 2021-04-04 MED ADMIN — FENTANYL CITRATE (PF) 100 MCG/2ML IJ SOLN: INTRAVENOUS | @ 23:00:00 | Stop: 2021-04-04 | NDC 00409909422

## 2021-04-04 MED ADMIN — ONDANSETRON HCL 4 MG/2ML IJ SOLN: INTRAVENOUS | @ 22:00:00 | Stop: 2021-04-04 | NDC 60505613005

## 2021-04-04 MED ADMIN — MIDAZOLAM HCL (PF) 2 MG/2ML IJ SOLN: INTRAVENOUS | @ 23:00:00 | Stop: 2021-04-04 | NDC 00409230517

## 2021-04-04 NOTE — H&P
UPDATED H&P REQUIREMENT    For [facility] and [facility]    WHAT IS THE STATUS OF THE PATIENT'S MOST CURRENT HISTORY AND PHYSICAL?   - The most current H&P is >24 hours and <30 days, and having examined the patient, I attest that there have been no changes. (This suffices as an update to the H&P).      REFER TO MEDICAL STAFF POLICIES REGARDING PRE-PROCEDURE HISTORY AND PHYSICAL EXAMINATION AND UPDATED H&P REQUIREMENTS BELOW:    [facility] and [facility] Medical Staff Policy 200 - For Patients Undergoing Procedures Requiring Moderate or Deep Sedation, General Anesthesia or Regional Anesthesia    Contents of a History and Physical Examination (H&P):    The H&P shall consist of chief complaint, history of present illness, allergies and medications, relevant social and family history, past medical history, review of systems and physical examination, and assessment and plan appropriate to the patient's age.    For Patients Undergoing Procedures Requiring Moderate or Deep Sedation, General Anesthesia or Regional Anesthesia:    1. An H&P shall be performed within 24 hours prior to the procedure by a qualified member of the medical staff or designee with appropriate privileges, except as noted in item 2 below.    2. If a complete history and physical was performed within thirty (30) calendar days prior to the patient's admission to the Medical Center for elective surgery, a member of the medical staff assumes the responsibility for the accuracy of the clinical information and will need to document in the medical record within twenty-four (24) hours of admission and prior to surgery or major invasive procedure, that they either attest that the history and physical has been reviewed and accepted, or document an update of the original history and physical relevant to the patient's current  clinical status.    3. Providing an H&P for patients undergoing surgery under local anesthesia is at the discretion of the Attending Physician.     4. When a procedure is performed by a dentist, podiatrist or other practitioner who is not privileged to perform an H&P, the anesthesiologist's assessment immediately prior to the procedure will constitute the 24 hour re-assessment.The dentist, podiatrist or other practitioner who is not privileged to perform an H&P will document the history and physical relevant to the procedure.    5. If the H&P and the written informed consent for the surgery or procedure are not recorded in the patient's medical record prior to surgery, the operation shall not be performed unless the attending physician states in writing that such a delay could lead to an adverse event or irreversible damage to the patient.    6. The above requirements shall not preclude the rendering of emergency medical or surgical care to a patient in dire circumstances.

## 2021-04-04 NOTE — Discharge Instructions
c  SOFT TISSUE BIOPSY    Discharge Instructions    WHAT TO EXPECT AFTER YOUR PROCEDURE  You may experience soreness and tenderness at the biopsy site for up to a week, which is normal. You can apply ice to the incision/biopsy area to reduce swelling if it is bothersome.   It is normal to experience discomfort and mild bruising after the procedure. It is not normal to have significant pain, persistent redness, tight swelling, skin that is hot to the touch, bleeding, drainage, fever and/or extensive bruising. If any of these symptoms develop, call your referring provider or the [facility] radiology fellow on-call.    WHEN TO CONTACT YOUR DOCTOR  Fever and/or chills (temperature greater than 100.4? F or 38? C).  Increasing pain for more than a few days or severe pain.  Bright red blood that doesn't stop or has soaked through the bandage over the procedure site.  A bruise that continues to expand or a growing lump at your procedure site.  Foul smelling or discolored drainage from the procedure site.  Worsening skin redness, tight swelling or skin that is hot to the touch at the procedure site.    SITE CARE AND BATHING  After 24 hours, you may start showering.   Remove the outer dressing (if present) prior to showering. If you have steri-strips on the biopsy site, leave the tape on for a week or until it falls off.  If you have steri-strips and develop skin irritation (itchiness, redness, blisters), you should remove the steri-strips. Apply an antibiotic ointment (e.g. Bacitracin, Neosporin) and a dry dressing until the site heals.  Do not scrub or try to clean the procedure site directly. Gently rinse the site with warm soapy water and pat the area dry.  Do NOT soak the procedure site in water (NO bathtubs, hot tubs, swimming pools, or lakes) for seven (7) days.     PAIN MANAGEMENT  Some bruising and soreness are normal for up to five (5) days.   In the absence of allergies or contraindications, an over-the-counter analgesic such as acetaminophen (Tylenol) or ibuprofen (Advil, Motrin, etc.) can be taken for pain.  If the doctor gave you a prescription medicine for pain, take it as prescribed.      ACTIVITY  To help minimize bleeding and discomfort, avoid tough physical activity (extensive stair climbing, sports, etc.) for two (2) days.    DIET  You may eat your normal diet after the procedure unless your doctor tells you otherwise.    MEDICATIONS  You may continue all your other medications unless your doctor tells you otherwise.    RESULTS AND FOLLOW UP   The laboratory results will be sent to the doctor who ordered the procedure, such as your primary care doctor. Please follow up with them to discuss the results.  The results usually take five (5) business days to come back. In a very small number of cases, the sample obtained may not be enough to get a good result.    QUESTIONS AND CONCERNS   Please call the Interventional Radiology department with questions related to:  Uncontrolled pain at your procedure site.  Questions about your procedure or your prescriptions.  Problems with your procedure site dressing (bandage).  During business hours (Monday through Friday, 8:00 am - 4:30 pm): Call the Interventional Radiology clinic at [phone] and select Option #4.  For urgent issues outside of business hours (weekends, or weekdays after 4:30 pm): Call the hospital operator at [facility]  [phone] and request the ?Musculoskeletal Radiology Fellow on-call.?                                PACU - DISCHARGE INSTRUCTIONS                           INTRAVENOUS SEDATION (Adult)  Here are the Discharge Instructions to follow after your procedure:  DIET: Your diet may be based on your own preference:  Begin with clear liquids such as soda, apple juice, tea, etc. DO NOT use a straw for drinking as this increases the amount of air swallowed and can increase bloating and stomach distress. If liquids are tolerated, progress slowly to your usual diet.  NOTE: Intermittent nausea, with or without vomiting, is a common side effect following surgery with intravenous sedation. If this occurs, DO NOT eat or drink anything until it has subsided. If the nausea or vomiting persists for more than 24 hours contact your physician or go to the nearest emergency room.  ACTIVITY: You may gradually return to your usual activity if not restricted by your surgeon. Sedative medications may linger in the body for 24-48 hours. It is not uncommon to feel somewhat drowsy and/or dizzy the remainder of the day following surgery. Avoid bending over as this may enhance dizziness. Do not drive an automobile or operate any heavy machinery for 24 hours. Do not make sudden movements when changing position, such as from a reclining to  upright position. Do not sign any legal documents or make any important decisions for 24 hours. Do not drink alcohol for 24 hours.  ADDITIONAL INSTRUCTIONS: It is not uncommon to tire easily and/or feel exhausted the day following surgery. Do not be alarmed as this is normal. Plan activities that will allow for rest periods as needed.  For your safety, we strongly advise that a responsible adult remain with you for the rest of the surgical day and night. Also, you should not be responsible for the care of others (children/adults) for 24 hours post anesthesia and a responsible adult should be present to assume these responsibilities.  You should take several deep breaths and give several forceful coughs every hour during the initial post-operative day. This will help to clear mucus and keep the lungs appropriately inflated. If you need advice or have any questions regarding anesthesia, please call the appropriate number and ask to speak to an available anesthesiologist:  [phone]: [id number] until 5pm, [phone]: After-hours or Weekends/Holidays  (This is the [facility]; ask to have the faculty anesthesiologist paged

## 2021-04-04 NOTE — H&P
UPDATED H&P REQUIREMENT    For [facility] and [facility]    WHAT IS THE STATUS OF THE PATIENT'S MOST CURRENT HISTORY AND PHYSICAL?   -      REFER TO MEDICAL STAFF POLICIES REGARDING PRE-PROCEDURE HISTORY AND PHYSICAL EXAMINATION AND UPDATED H&P REQUIREMENTS BELOW:    [facility] and [facility] Medical Staff Policy 200 - For Patients Undergoing Procedures Requiring Moderate or Deep Sedation, General Anesthesia or Regional Anesthesia    Contents of a History and Physical Examination (H&P):    The H&P shall consist of chief complaint, history of present illness, allergies and medications, relevant social and family history, past medical history, review of systems and physical examination, and assessment and plan appropriate to the patient's age.    For Patients Undergoing Procedures Requiring Moderate or Deep Sedation, General Anesthesia or Regional Anesthesia:    1. An H&P shall be performed within 24 hours prior to the procedure by a qualified member of the medical staff or designee with appropriate privileges, except as noted in item 2 below.    2. If a complete history and physical was performed within thirty (30) calendar days prior to the patient?s admission to the Medical Center for elective surgery, a member of the medical staff assumes the responsibility for the accuracy of the clinical information and will need to document in the medical record within twenty-four (24) hours of admission and prior to surgery or major invasive procedure, that they either attest that the history and physical has been reviewed and accepted, or document an update of the original history and physical relevant to the patient's current clinical status.    3. Providing an H&P for patients undergoing surgery under local anesthesia is at the discretion of the Attending Physician.     4. When a procedure is performed by a dentist, podiatrist or other practitioner who is not privileged to perform an H&P, the anesthesiologist?s assessment immediately prior to the procedure will constitute the 24 hour re-assessment.The dentist, podiatrist or other practitioner who is not privileged to perform an H&P will document the history and physical relevant to the procedure.    5. If the H&P and the written informed consent for the surgery or procedure are not recorded in the patient's medical record prior to surgery, the operation shall not be performed unless the attending physician states in writing that such a delay could lead to an adverse event or irreversible damage to the patient.    6. The above requirements shall not preclude the rendering of emergency medical or surgical care to a patient in dire circumstances.

## 2021-04-04 NOTE — Op Note
[place] handoff to Hinton Dyer, [place]

## 2021-04-05 ENCOUNTER — Inpatient Hospital Stay: Admit: 2021-04-05 | Discharge: 2021-04-05 | Payer: PRIVATE HEALTH INSURANCE | Source: Ambulatory Visit

## 2021-04-05 DIAGNOSIS — C779 Secondary and unspecified malignant neoplasm of lymph node, unspecified: Secondary | ICD-10-CM

## 2021-04-05 DIAGNOSIS — R112 Nausea with vomiting, unspecified: Secondary | ICD-10-CM

## 2021-04-05 DIAGNOSIS — D509 Iron deficiency anemia, unspecified: Secondary | ICD-10-CM

## 2021-04-05 DIAGNOSIS — C541 Malignant neoplasm of endometrium: Secondary | ICD-10-CM

## 2021-04-05 DIAGNOSIS — I824Y2 Acute embolism and thrombosis of unspecified deep veins of left proximal lower extremity: Secondary | ICD-10-CM

## 2021-04-05 DIAGNOSIS — C7951 Secondary malignant neoplasm of bone: Secondary | ICD-10-CM

## 2021-04-05 LAB — Expedited COVID-19 and Influenza A B PCR

## 2021-04-05 LAB — UA,Dipstick

## 2021-04-05 LAB — Sepsis Lactate Protocol
BLOOD LACTATE: 44 mg/dL — ABNORMAL HIGH (ref 5–18)
BLOOD LACTATE: 28 mg/dL — ABNORMAL HIGH (ref 5–18)

## 2021-04-05 LAB — Prothrombin Time Panel: INR: 1.2 s (ref 11.5–14.4)

## 2021-04-05 LAB — Magnesium: MAGNESIUM: 1.8 meq/L (ref 1.4–1.9)

## 2021-04-05 LAB — Comprehensive Metabolic Panel
CREATININE: 0.6 mg/dL (ref 0.60–1.30)
TOTAL CO2: 17 mmol/L — ABNORMAL LOW (ref 20–30)

## 2021-04-05 LAB — Basic Metabolic Panel
POTASSIUM: 4.7 mmol/L (ref 3.6–5.3)
SODIUM: 129 mmol/L — ABNORMAL LOW (ref 135–146)

## 2021-04-05 LAB — Blood Culture Detection
BLOOD CULTURE FINAL STATUS: NEGATIVE
BLOOD CULTURE FINAL STATUS: NEGATIVE
BLOOD CULTURE FINAL STATUS: NEGATIVE
BLOOD CULTURE FINAL STATUS: NEGATIVE

## 2021-04-05 LAB — Sepsis Lactate Repeat
BLOOD LACTATE: 32 mg/dL — ABNORMAL HIGH (ref 5–18)
BLOOD LACTATE: 28 mg/dL — ABNORMAL HIGH (ref 5–18)

## 2021-04-05 LAB — Differential Automated: NEUTROPHIL PERCENT, AUTO: 86.7 (ref 1.80–6.90)

## 2021-04-05 LAB — CBC
MEAN PLATELET VOLUME: 9.4 fL (ref 9.3–13.0)
WHITE BLOOD CELL COUNT: 17.18 10*3/uL — ABNORMAL HIGH (ref 4.16–9.95)

## 2021-04-05 LAB — UA,Microscopic: RBCS: 11 {cells}/uL (ref 0–?)

## 2021-04-05 MED ADMIN — ENOXAPARIN SODIUM 100 MG/ML IJ SOSY: 95 mg | SUBCUTANEOUS | @ 05:00:00 | Stop: 2021-04-05 | NDC 00781326805

## 2021-04-05 MED ADMIN — SODIUM CHLORIDE 0.9 % IV SOLN: 100 mL/h | INTRAVENOUS | @ 08:00:00 | Stop: 2021-04-05 | NDC 00338004904

## 2021-04-05 MED ADMIN — OXYCODONE HCL 5 MG PO TABS: 10 mg | ORAL | @ 07:00:00 | Stop: 2021-04-12 | NDC 42858000110

## 2021-04-05 MED ADMIN — SODIUM CHLORIDE 0.9 % IV SOLN: 125 mL/h | INTRAVENOUS | @ 11:00:00 | Stop: 2021-04-05 | NDC 00338004904

## 2021-04-05 MED ADMIN — SODIUM CHLORIDE 0.9 % IV BOLUS: 1000 mL | INTRAVENOUS | @ 03:00:00 | Stop: 2021-04-05

## 2021-04-05 MED ADMIN — SODIUM CHLORIDE 0.9 % IV BOLUS: 1000 mL | INTRAVENOUS | @ 09:00:00 | Stop: 2021-04-05 | NDC 00338004904

## 2021-04-05 MED ADMIN — FLUCONAZOLE 150 MG PO TABS: 150 mg | ORAL | @ 10:00:00 | Stop: 2021-04-05 | NDC 59762501701

## 2021-04-05 MED ADMIN — ENOXAPARIN SODIUM 100 MG/ML IJ SOSY: 95 mg | SUBCUTANEOUS | @ 10:00:00 | Stop: 2021-04-05 | NDC 00781326805

## 2021-04-05 MED ADMIN — OXYCODONE HCL 5 MG PO TABS: 10 mg | ORAL | @ 14:00:00 | Stop: 2021-04-05 | NDC 00406055262

## 2021-04-05 MED ADMIN — SENNOSIDES 8.6 MG PO TABS: 1 | ORAL | @ 19:00:00 | Stop: 2021-04-06 | NDC 00904652261

## 2021-04-05 MED ADMIN — PIPERACILLIN-TAZOBACTAM 3.375 G/50 ML RTU: 3.375 g | INTRAVENOUS | @ 08:00:00 | Stop: 2021-04-05 | NDC 00206886101

## 2021-04-05 MED ADMIN — OXYCODONE HCL 5 MG PO TABS: 10 mg | ORAL | @ 07:00:00 | Stop: 2021-04-05

## 2021-04-05 MED ADMIN — POLYETHYLENE GLYCOL 3350 17 G PO PACK: 17 g | ORAL | @ 19:00:00 | Stop: 2021-04-06 | NDC 60687043199

## 2021-04-05 MED ADMIN — SODIUM CHLORIDE 0.9 % IV BOLUS: 1000 mL | INTRAVENOUS | @ 03:00:00 | Stop: 2021-04-05 | NDC 00338004904

## 2021-04-05 MED ADMIN — PIPERACILLIN-TAZOBACTAM 3.375 GM/50 ML RTU (EXT 4HR): 3.375 g | INTRAVENOUS | @ 14:00:00 | Stop: 2021-04-05 | NDC 00206886101

## 2021-04-05 MED ADMIN — ENOXAPARIN SODIUM 100 MG/ML IJ SOSY: 95 mg | SUBCUTANEOUS | @ 19:00:00 | Stop: 2021-04-06 | NDC 00781326805

## 2021-04-05 NOTE — Discharge Summary
Discharge Summary           Name: Alisha Orr MRN: [id number] DOB: 1955-12-26   Admit Date: 04/04/2021   D/C Date: 04/05/2021  1:18 PM LOS:  LOS: 1 day    AdmittingAttending Marigene Ehlers. Pollyann Kennedy, MD Discharge Attending No att. providers found   PCP Trena Platt., DO Discharge Provider Malachy Moan, MD     Inpatient Treatment Team Treatment Team:   Team: [facility]  Primary - Resident: Malachy Moan., MD  Primary - Intern: Milinda Pointer, MD  Case Manager: Hoyle Barr none   Outpatient Care Team Patient Care Team:  Audree Bane as Ambulatory Care Coordinator     Admission Diagnosis (reason for admission) Discharge Diagnosis (conditions treated during hospitalization)   # Abdominal pain, POA  # Nausea/vomiting, POA # Abdominal pain, POA  # Nausea/vomiting, POA  # Leukocytosis, POA  # Hyponatremia, POA  # LLE DVT, POA   # Normocytic anemia, POA  # Thrombocytosis, POA  # AGMA, POA  # Vaginal candidiasis, POA  # Metastatic high grade uterine carcinoma, POA     Disposition Condition/ECOG Performance Status   AMA - Left Against Medical Advice ECOG 2-3     Outpatient Provider To-Do List   [ ]  f/u oncology and PCP for symptom management prn      Pending labs   Unresulted Labs (From admission, onward)     Start     Ordered    04/05/21 0120  Bacterial Culture Urine  [[id number]]  Once,   STAT         04/05/21 0115    04/04/21 2300  Blood culture #1  (Blood Cultures X 2)  [[id number]]  Once,   Routine        Question:  Nurse Collect?  Answer:  Yes    04/04/21 2256    04/04/21 2300  Blood culture #2  (Blood Cultures X 2)  [[id number]]  Once,   Routine         04/04/21 2256                  HPI & Hospital Course      Brief History of Present Illness: See admission H&P for full details of admission.  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 Who presents with 3 weeks of abdominal pain, N/V, and diarrhea which has been improving over the last 3 days. Pt's family refusing CT for evaluation for obstruction, and preferring MRI.     Hospital Course by Problem:      #AMA  Designated decision maker/DPOA Lacie Scotts of [facility] on my assessment demonstrated capacity to understand the purpose of recommended treatment and the consequences of refusal of treatment as noted below.   The recommended treatment for nausea/vomiting & abdominal was evaluation for bowel obstruction. The risks of refusing treatment including obstruction, perforation, sepsis, death were discussed with the patient. The benefits of accepting and continuing the current treatment including detailed imaging of abdomen/pelvis, symptom control, and potential surgical consultation (if needed0 were discussed with the patient. In addition, alternative treatments options were discussed. The reason for refusal of recommended treatment was expectation for MRI to be completed early in admission to rule out obstruction with hopes for early discharge to make it to outpt surgical appointment. Follow up instructions and return precautions  were reviewed with the patient. Prescriptions were not sent to the patient's preferred pharmacy. The patient's family were also notified of the patient's decision to leave against medical advice.      Malachy Moan 04/05/2021 2:41 PM     # Abdominal pain, POA  # Nausea/vomiting, POA - ongoing for the last month, possibly related to enlarging uterus with endometrial mass compressing surrounding GI organs. Also, sudden cessation of previous daily diarrhea raises concern for bowel obstruction. Pt and her daughter decline CT scans and request MRI. KUB w/o evidence of obstruction. Given leukocytosis, elevated lactate, and physical exam w/ findings of distension, would want CT vs MRI to definitively r/o obstruction.   - return precautions provided  ?  # Leukocytosis, POA - DDx includes  related to malignancy and worsened by hemoconcentration. Less likely infectious as pt afebrile. Doubt 2/2 c diff given hx of constipation. Doubt UTI given lack of dysuria.     - s/p empiric Zosyn (3/21-) defer discontinuing abx on dc.  ?  # Hyponatremia, POA - presented with Na 127. Most likely hypovolemic hyponatremia based on poor PO intake and GI losses.  - Improved w/ IV fluids  ?  # LLE DVT, POA - suspect this is the same DVT detected on [place] in 03/2021. Pt has not noticed increase in pain or swelling.  - lovenox 95 BID  ?  # Normocytic anemia, POA - combination of ACI 2/2 malignancy and iron deficiency due to chronic vaginal bleeding. Pt not currently taking iron supplement. Currently near baseline.  - resume iron supplementation on discharge  ?  # Thrombocytosis, POA - likely reactive in setting of malignancy and possibly infection.  ?  # AGMA, POA - with low bicarb and AG of 19. Suspect lactic acidosis.  ?  # Vaginal discharge and itching, POA - suspect vaginal candidiasis.   - fluconazole 150 mg PO x1  ?  CHRONIC/STABLE:  ?  # Metastatic high grade uterine carcinoma - diagnosed 01/2021. Followed by Dr. Kennon Holter. Has been undergoing biopsies to formulate treatment plan. Most recent plan was for neoadjuvant carboplatin per paclitaxel. Not started yet.   - f/u with Dr. Kennon Holter on discharge  ?  # PE  - Lovenox as above  ?    Significant Studies & Procedures    Procedures: none    Labs:  Last BMP:   Lab Results   Component Value Date/Time    NA 129 (L) 04/05/2021 03:31 AM    K 4.7 04/05/2021 03:31 AM    CL 96 04/05/2021 03:31 AM    CO2 19 (L) 04/05/2021 03:31 AM    BUN 13 04/05/2021 03:31 AM    CREAT 0.64 04/05/2021 03:31 AM    GLUCOSE 128 (H) 04/05/2021 03:31 AM     Last BNP: No results found for: BNP  Last Cal/iCalMg/Ph:   Lab Results   Component Value Date/Time    CALCIUM 9.0 04/05/2021 03:31 AM    MG 1.8 04/05/2021 03:31 AM     Last CBC:   Lab Results   Component Value Date/Time    WBC 15.15 (H) 04/05/2021 03:31 AM    HGB 8.5 (L) 04/05/2021 03:31 AM    HCT 27.5 (L) 04/05/2021 03:31 AM    PLT 457 (H) 04/05/2021 03:31 AM     Last LFT:   Lab Results   Component Value Date/Time    TOTPRO 7.8 04/04/2021 07:55 PM    ALBUMIN 3.0 (L) 04/04/2021 07:55 PM  BILITOT 0.4 04/04/2021 07:55 PM    ALKPHOS 112 04/04/2021 07:55 PM    AST 241 (H) 04/04/2021 07:55 PM    ALT 11 04/04/2021 07:55 PM       All labs have been reviewed by me.    Pertinent Imaging:  Last KUB: XR kub (1 view)    Result Date: 04/05/2021  IMPRESSION: Paucity of bowel gas within the central abdomen, likely due to known enlarged uterus/endometrial mass seen on PET CT 03/09/2021. No evidence of bowel obstruction. A prominent, air-filled loop of bowel in the left upper abdomen measuring up to 4.9 cm in diameter, favored to represent a segment of descending colon. No evidence of pneumoperitoneum. I, Esau Grew, M.D., have reviewed this radiological study personally and I am in full agreement with the findings of the report presented here. Dictated by: Jerolyn Shin   04/05/2021 1:07 AM Signed by: Esau Grew   04/05/2021 3:29 AM      All imaging have been reviewed by me.    Day of Discharge Physical Exam    BP 107/92 (BP Location: Right arm, Patient Position: Lying)  ~ Pulse (!) 102  ~ Temp 36.5 ?C (97.7 ?F) (Oral)  ~ Resp 15  ~ Ht 1.7 m (5' 6.93'')  ~ Wt 94 kg (207 lb 3.2 oz)  ~ SpO2 96%  ~ BMI 32.52 kg/m?     General: well-appearing, in no acute distress  Head: atraumatic, normocephalic  HEENT: pupils equal and reactive, extraocular eye movements intact, sclera anicteric, oropharynx clear   Neck: supple, no significant adenopathy  Heart: normal rate, regular rhythm, normal S1, S2, no mumurs, rubs, or gallops, peripheral pulses normal  Lungs: clear to auscultation bilaterally, no wheezes, rales, or rhonchi  Abdomen: soft, non-tender, non-distended  Neuro: alert, oriented, normal speech, no focal findings or movement disorder noted     Discharge medications High-risk medications      Medication List      CONTINUE taking these medications    enoxaparin 100 mg/mL injection  Commonly known as: Lovenox  Inject 0.95 mLs (95 mg total) under the skin two (2) times daily.     ferrous sulfate 324 MG Tbec     furosemide 20 mg tablet  Commonly known as: Lasix     latanoprost 0.005% ophthalmic solution  Commonly known as: Xalatan     levalbuterol 45 mcg/act inhaler  Commonly known as: XOPENEX HFA  Inhale 2 puffs every six (6) hours as needed for Wheezing.     ondansetron ODT 4 mg disintegrating tablet  Commonly known as: Zofran ODT  Take 1-2 tablets (4-8 mg total) by mouth every eight (8) hours as needed for Nausea or Vomiting.     oxyCODONE 5 mg tablet  Take 0.5 tablets (2.5 mg total) by mouth every six (6) hours as needed for Moderate Pain (Pain Scale 4-6) or Severe Pain (Pain Scale 7-10). Max Daily Amount: 10 mg     timolol 0.5% ophthalmic solution  Commonly known as: Timoptic        ASK your doctor about these medications    cefuroxime 500 mg tablet  Commonly known as: Ceftin     cholecalciferol 125 mcg (5000 units) tablet         Coumadin:This patient does not have coumadin on their discharge med list    Platelet Inhibitors: No platelet inhibitors or aspirin on discharge med list    Controlled substances: Controlled meds:   Controlled Medications     Opioid Agonists Disp  Start End     oxyCODONE 5 mg tablet    30 tablet 04/01/2021     Sig - Route: Take 0.5 tablets (2.5 mg total) by mouth every six (6) hours as needed for Moderate Pain (Pain Scale 4-6) or Severe Pain (Pain Scale 7-10). Max Daily Amount: 10 mg - Oral    Earliest Fill Date: 04/01/2021           Nutrition recommendations & malnutrition assessment   The patient was not consulted or assessed by a Registered Dietitian (RD) during this admission.         Discharge diet orders   There are no active discharge diet orders for this encounter.      Rehab assessment   No PT evaluation this encounter                  Discharge activity orders   There are no active discharge activity orders for this encounter.      Requested appointments Scheduled appointments    We will schedule and contact you with follow up appointment(s)   As   directed      Is the patient being discharged to SNF? No  Clinic/Test/Procedure to be scheduled: oncology  Doctor Preferences (If applicable): Dr. Kennon Holter  Time Frame: 1 week  Reason for Appointment/Procedure: symptoms- pain and nausea    Is the patient being discharged to SNF? No  Clinic/Test/Procedure to be scheduled: primary care  Doctor Preferences (If applicable): n/a  Time Frame: 1 week  Reason for Appointment/Procedure: symptoms- pain, constipation, and nausea          []  Pallative Care  []  [facility]  []  IR for paracentesis/thoracentesis  []  Pain Management  []  Other:  []  None    Primary Oncologist Notified of D/C Plan?  No Future Appointments   Date Time Provider Department Center   04/13/2021  3:00 PM TO PULM PFT P[facility] TO [facility] [place]/   05-22-21  9:20 AM Sarita Haver, Shelbie Ammons., MD PULM TO [facility] [place]/   04/28/2021  1:00 PM Faith Rogue, MD [facility] [place]/Cen   05/09/2021 10:30 AM Melody Comas, MD, PhD [facility] [place]/Cen       Pain Meds/Other Meds Delivered to room? No  If no to the above please indicate reason: No refills needed    Primary Onc Appt within 1 week set up or requested?  Yes    If no, please indicate reason:  n/a     Case Manager/Home Health assessment              Home Health orders   There are no active discharge home health orders for this encounter.      Goals of Care Note (if new for this encounter)         LACE+ Risk Stratification    Total Score Risk Stratification   0-28 Minimal Risk   29-58 Moderate Risk   59-78 High Risk   79-90 Highest Risk     Readmission Score            67 Total Score    15 Urgent Admission    2 Length of Stay    50 Charlson Score (by age & number of urgent admissions)        Criteria that do not apply:    Female Patient    Discharge Institution    Alternative Level of Care Status    ED Visits in Previous 6 Months  Elective Admission in Previous Year            Attestation & Signature   I have seen and examined the patient on their discharge day. I have reviewed, edited, and agree with the above discharge summary.    Patient was seen, examined and discussed with Attending No att. providers found    Malachy Moan, MD  04/05/2021 at 2:41 PM

## 2021-04-05 NOTE — Discharge Instructions
Although you are leaving the hospital against the advice of your medical team, we still want to make sure that you receive the best medical treatment possible.   Please pick up any prescribed discharge medications at your preferred pharmacy, if you were unable to have your prescriptions filled at the [facility].   We recommend follow up with Dr. Georgann Housekeeper are welcome to return the ED at any time if you are feeling worse after you leave. We will be happy to continue caring for you if you decide to return.

## 2021-04-05 NOTE — Nursing Note
Pt off the unit, left against medical advice accompanied by daughter at her side & discharge lounge personnel for hospital transport. Left stable.

## 2021-04-05 NOTE — Op Note
Pt discharged at this time. Per daughter, pt back to baseline mental status and feels comfortable with discharge at this time. Reports that she will be taking patient to the ER to be evaluated for separate issue of concern. Patient discharged with daughter and all belongings and escorted down to ER admissions by wheelchair. Discharge instructions reviewed with patient and daughter and they verbalize understanding and agree with plan.

## 2021-04-05 NOTE — ED Provider Notes
[facility]  Emergency Department Service Report    Alisha Orr 66 y.o. female , presents with Abdominal Pain      Triage   Arrived on 04/04/2021 at 5:10 PM   Arrived by Wheelchair [4]    ED Triage Vitals   Temp Temp Source BP Heart Rate Resp SpO2 O2 Device Pain Score Weight   04/04/21 1711 04/04/21 1711 04/04/21 1711 04/04/21 1711 04/04/21 1711 04/04/21 1711 04/04/21 1711 04/04/21 1743 --   37 ?C (98.6 ?F) Oral 110/74 (!) 104 20 97 % None (Room air) Five        Pre hospital care:       Allergies   Allergen Reactions   ? Codeine Other (See Comments)     REACTION: ''out of body experience''  REACTION: ''out of body experience''  REACTION: ''out of body experience''  REACTION: ''out of body experience''     ? Latex Hives and Rash       History   Patient is a 66 y.o. female with hx of endometrial CA with mets to bone and lymph nodes, acute DVT of proximal vein of lower extremity, PE who presents to the ED with complaint of abdominal pain with associated N/V, constipation, and poor PO intake x 2-3 weeks. Per daughter, pt was previously vomiting about 4x per day, 1x per day for last several days. Pt endorses foul smelling urine, foul smelling vaginal discharge, and vaginal bleeding. Also endorses abdominal distension. Per family, pt fell 7-8 days ago and has been having L hip pain since. Pt also endorses L leg swelling x 1.5 weeks. Pt not currently on any treatment for uterine CA, had biopsy today. Pt last BM was yesterday. Pt and family referred by oncologist to present to ED to r/o bowel obstruction.     The history is provided by the patient, a relative and medical records. No language interpreter was used.   Abdominal Pain  The primary symptoms of the illness include abdominal pain, nausea, vomiting, vaginal discharge and vaginal bleeding. Episode onset: 2-3 weeks ago. The onset of the illness was gradual. The problem has been gradually worsening.   Associated medical issues comments: untreated uterine CA.              Past Medical History:   Diagnosis Date   ? Acute deep vein thrombosis (DVT) of proximal vein of lower extremity (HCC/RAF) 03/11/2021   ? Endometrial cancer (HCC/RAF)    ? Glaucoma    ? 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 Metastasis to lymph nodes (HCC/RAF) 03/11/2021        Past Surgical History:   Procedure Laterality Date   ? ENDOMETRIAL BIOPSY     ? EYE SURGERY      detached retina, then removal of scar tissue   ? WISDOM TOOTH EXTRACTION          Past Family History   family history includes Pancreatic cancer in her cousin.                 Past Social History   she reports that she has quit smoking. Her smoking use included cigarettes. She has never used smokeless tobacco. She reports current drug use. Drug: Other-see comments. No history on file for alcohol use and sexual activity.       Physical Exam   Physical Exam  Vitals and nursing note reviewed.   Constitutional:       Appearance: Normal appearance.   HENT:  Head: Normocephalic and atraumatic.      Nose: Nose normal.      Mouth/Throat:      Mouth: Mucous membranes are moist.      Pharynx: Oropharynx is clear.   Eyes:      Extraocular Movements: Extraocular movements intact.      Conjunctiva/sclera: Conjunctivae normal.      Pupils: Pupils are equal, round, and reactive to light.   Cardiovascular:      Rate and Rhythm: Normal rate and regular rhythm.      Pulses: Normal pulses.      Heart sounds: Normal heart sounds.   Pulmonary:      Effort: Pulmonary effort is normal. No respiratory distress.      Breath sounds: Normal breath sounds.   Abdominal:      General: Abdomen is flat. Bowel sounds are normal. There is distension.      Palpations: Abdomen is soft.      Tenderness: There is no abdominal tenderness.      Comments: firm   Musculoskeletal:         General: Normal range of motion.      Cervical back: Normal range of motion.      Right lower leg: No edema.      Left lower leg: 2+ Pitting Edema (throughout entire leg) present.      Comments: L groin TTP   Skin:     General: Skin is warm and dry.      Capillary Refill: Capillary refill takes less than 2 seconds.   Neurological:      General: No focal deficit present.      Mental Status: She is alert and oriented to person, place, and time.   Psychiatric:         Mood and Affect: Mood normal.         Behavior: Behavior normal.         ED Course     ED Course as of 04/04/21 2026   Mon Apr 04, 2021   1902 Assessment: abdominal pain in the setting of untreated uterine CA.   Plan: labs, urine, CT abdomen pelvis, hydration, pain control [DD]   2025 White Blood Cell Count(!): 17.18 [SK]      ED Course User Index  [DD] Glendel Jaggers  [SK] Sydnee Cabal., MD, MPH       Laboratory Results   Labs Reviewed - No data to display    Imaging Results     No orders to display       Administered Medications     Medication Administration from 04/04/2021 1711 to 04/04/2021 1849     None          Procedures   Procedural Sedation  Procedures    Medical Decision Making   Alisha Orr is a 66 y.o. female ***    MDM  Clinical Impression   No diagnosis found.      Prescriptions     New Prescriptions    No medications on file       Disposition and Follow-up   Disposition: Refresh note to pull in Disposition    Future Appointments   Date Time Provider Department Center   04/13/2021  3:00 PM TO PULM PFT PULM TO [facility] [place]/   May 22, 2021  9:20 AM Sarita Haver, Shelbie Ammons., MD PULM TO [facility] [place]/   05/09/2021 10:30 AM Melody Comas, MD, PhD [facility] [place]/Cen       Follow up with:  No follow-up provider specified.    Return precautions are specified on After Visit Summary.    The documentation on this chart was performed by Eliberto Ivory, scribed for Sydnee Cabal., MD, MPH  04/04/2021 6:49 PM     ***

## 2021-04-05 NOTE — Nursing Note
AMA discharge initiated. Pt 's daughter at bedside to pick up pt. AVS provided IV access removed.

## 2021-04-05 NOTE — Op Note
IR called back reporting pt can resume blood thinning medication at the next dose. Daughter, Danae Chen, called and updated on this plan.

## 2021-04-05 NOTE — Nursing Note
Phone call received from pt's daughter Casimira Sutphin, reporting that she needs to talk to physician regarding discharging pt today due to a mandatory oncology outpatient appointment at an outside facility. MD paged, notified. Requested to give daughter a call at [phone]

## 2021-04-05 NOTE — ED Notes
Patient transported to US 

## 2021-04-05 NOTE — ED Notes
XR at bedside

## 2021-04-05 NOTE — Consults
SPIRITUAL CARE CONSULTATION NOTE    PATIENT:  Alisha Orr  MRN:  [id number]     Patient Info        Religious/Spiritual Identity:        Darrick Meigs       Last Anointed Date:                 Baptised:                 Spiritual Care Visit Details              Date of Visit:  04/05/21  Time of Visit:  1155  Visited with Patient   Visit length 5 Minutes   Referral source Self-initiated   Reason for visit Initial visit/assessment      Spiritual Assessment     Spiritual practices & resources Chaplain visits, Meaningful personal beliefs and values, Personal faith/Spiritual beliefs, Prayer   Areas of spiritual/emotional distress Concerns for health and healing, Emotional/Spiritual weariness/fatigue   Distressful feelings     Indicators of spiritual wellbeing Able to receive love and support, Trust in [place] of spiritual wellbeing        Plan     Spiritual care intervention Prayer, Introduction to chaplain services, Building trust, Ministry of presence, Active Listening, Offered words of comfort/encouragement, Blessing offered   Outcomes (per patient/family) Appreciated visit   Spiritual care plans Continue to visit as needed   Additional comments NA      Recommendation       Author:  Lucienne Capers 04/05/2021 12:36 PM  Contact info: SM pager: [phone] ext: [phone]

## 2021-04-05 NOTE — H&P
Solid [facility]cology History and Physical     PMD: Trena Platt., DO  DATE OF SERVICE: 04/05/2021  HOSPITAL DAY: 1  CHIEF COMPLAINT: Abdominal Pain (Abdominal pain for a month, per daughter pt is fighting uterine CA. 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    Daughter French Ana over phone assists in providing history.     Recently received antibiotics for UTI, back in 02/2021. After completing antibiotics, symptoms began. About 3 weeks ago, developed abdominal pain, nausea/vomiting, and diarrhea. Abdominal pain is in lower abdomen. Has been able to control pain somewhat with an oil containing marijuana. Just started oxycodone as well, which helps but now doesn't last the full 6 hours between doses. Nausea/vomiting has been slowly improving over the last 3 days. Previously was vomiting 4 times daily. Now down to once daily, and vomitus is usually mostly saliva. Has been taking an anti-nausea supplement called Dragon's Blood mixed in water, which seems to help. Last BM was yesterday, mostly mucous. Before that, had 3-4 BMs per day. Has not had a BM today. Pt is passing gas.    Nothing in particular about today drove them to present to ER. They've been planning to come to the ER, and today was most convenient.     Also has had continued vaginal bleeding. But something new is malodorous urine. Also has vaginal discharge and itching. Denies fever/chills.    Pt and daughter feel strongly against CT scan. They state that pt's oncologist recommended against getting another CT scan so soon. Requesting MRI.    Oncologic History:  12/2020: vaginal bleeding?  ?  02/07/2021: Still experiencing vaginal bleeding. ?  On exam, enlarged 20 week size uterus. ??  Pelvic US: Endometrium difficult to evaluate due to large fibroid (13 x 12 x 14 cm). Large fibroid visualized extending throughout entire uterus, possible intramural. There appears to be some fluid with the LUS along internal cervical os, wrapping along side the fibroid.?  ?  02/07/21:?ENDOMETRIUM (BIOPSY): [facility] review  - Poorly differentiated high grade malignant neoplasm  Comment: Sections show solid sheets of a high grade necrotic tumor with small, round tumor cells showing high N/C ratio, hyperchromasia, and high mitotic activity. ?Immunohistochemically tumor cells show negative CK AE1/3 and ER, positive PAX8, P16, and cyclin D1, null-type aberrant P53, and some neuroendocrine expression (CD56). ?This uterine tumor has a broad differential diagnosis which includes undifferentiated carcinoma, high grade sarcoma, or a sarcoma component of MMMT  ?  03/09/21: PET with markedly enlarged myomatous uterus with intensely FDG-avid expansile endometrial lesion corresponding to known malignancy.?Intensely FDG avid confluent retroperitoneal and bilateral iliac chain lymphadenopathy as well as at least 3 FDG avid intrathoracic nodal metastases, and right humeral head metastasis.?Bilateral multifocal pulmonary emboli. No evidence of right heart strain, but clot burden appears significant. The urinary bladder is thickened for the degree of underdistention. Correlate with urinalysis  ?  03/09/21: presented to outside ER for evaluation of PE?and discharged on lovenox  03/10/21: LE ultrasound with positive for acute partially occlusive DVT of the left posterior tibial vein.?No evidence of deep or superficial venous thpombosis in the right lower extremity    ED Course     ED Triage Vitals   Temp Temp Source BP Heart Rate  Resp SpO2 O2 Device Pain Score Weight   04/04/21 1711 04/04/21 1711 04/04/21 1711 04/04/21 1711 04/04/21 1711 04/04/21 1711 04/04/21 1711 04/04/21 1743 --   37 ?C (98.6 ?F) Oral 110/74 (!) 104 20 97 % None (Room air) Five      Labs notable for leukocytosis and hyponatremia. CTAP w/ contrast ordered, but pt declined due to concerns about radiation exposure. LLE US showed acute DVT in peroneal vein. Received home Lovenox dose and 1L NS.     Past Medical History     Past Medical History:   Diagnosis Date   ? Acute deep vein thrombosis (DVT) of proximal vein of lower extremity (HCC/RAF) 03/11/2021   ? Endometrial cancer (HCC/RAF)    ? Glaucoma    ? 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 Metastasis to lymph nodes (HCC/RAF) 03/11/2021       Past Surgical History     Past Surgical History:   Procedure Laterality Date   ? ENDOMETRIAL BIOPSY     ? EYE SURGERY      detached retina, then removal of scar tissue   ? WISDOM TOOTH EXTRACTION         Family History     Family History   Problem Relation Age of Onset   ? Pancreatic cancer Cousin        Social History     Social History     Socioeconomic History   ? Marital status: Other   Tobacco Use   ? Smoking status: Former     Types: Cigarettes   ? Smokeless tobacco: Never   Substance and Sexual Activity   ? Drug use: Yes     Types: Other-see comments     Comment: [employer] simpson oil   Social History Narrative    From [place] to [place] to be close to daughter while getting treatment    Lives with daughter and 2 grandchildren    Another daughter is here to help    4 adult children        Allergies     Allergies   Allergen Reactions   ? Codeine Other (See Comments)     REACTION: ''out of body experience''  REACTION: ''out of body experience''  REACTION: ''out of body experience''  REACTION: ''out of body experience''     ? Latex Hives and Rash       Home Medications     Prior to Admission medications    Medication Sig Start Date End Date Taking? Authorizing Provider   enoxaparin 100 mg/mL injection Inject 0.95 mLs (95 mg total) under the skin two (2) times daily. 03/28/21 04/27/21  Kandis Fantasia., MD   ferrous sulfate 324 MG TBEC Take 1 tablet (324 mg total) by mouth.  Patient not taking: Reported on 03/22/2021.    PROVIDER, HISTORICAL   furosemide 20 mg tablet Take 1 tablet (20 mg total) by mouth daily. 03/17/21   PROVIDER, HISTORICAL   latanoprost 0.005% ophthalmic solution  01/28/21   PROVIDER, HISTORICAL   levalbuterol (XOPENEX HFA) 45 mcg/act inhaler Inhale 2 puffs every six (6) hours as needed for Wheezing. 03/28/21 04/27/21  Merchant, Shelbie Ammons., MD   ondansetron ODT 4 mg disintegrating tablet Take 1-2 tablets (4-8 mg total) by mouth every eight (8) hours as needed for Nausea or Vomiting. 03/22/21   Trena Platt., DO   oxyCODONE 5 mg tablet Take 0.5 tablets (2.5 mg total) by mouth every six (6) hours  as needed for Moderate Pain (Pain Scale 4-6) or Severe Pain (Pain Scale 7-10). Max Daily Amount: 10 mg 04/01/21   Kandis Fantasia., MD   timolol 0.5% ophthalmic solution     PROVIDER, HISTORICAL       Physical Exam   Temp:  [36.2 ?C (97.1 ?F)-37 ?C (98.6 ?F)] 37 ?C (98.6 ?F)  Heart Rate:  [97-107] 97  Resp:  [15-23] 16  BP: (96-116)/(70-87) 112/73  NBP Mean:  [80-90] 80  SpO2:  [93 %-99 %] 95 %  I/O: No intake/output data recorded.    Gen: in mild discomfort, sitting up in bed  HEENT: sclerae anicteric, PERRL, moist mucous membranes, oropharynx clear  Neck: trachea midline, JVP nl  CV: tachycardic, regular rhythm, normal S1/S2, no murmurs, rubs, gallops, or thrills appreciated  Chest: clear to auscultation bilaterally, good air movement, no wheezes/rhonchi/rales   Abdomen: soft, distended w/ large palpable mass, non-tender, unable to auscultate bowel sounds  Extremities: warm, well-perfused; mild nonpitting edema in LLE  Neuro: alert and appropriately interactive, normal speech, moves all x4 spontaneously    Labs   I have reviewed the labs, which are significant for the following:  Lab Results   Component Value Date    WBC 17.18 (H) 04/04/2021    HGB 9.8 (L) 04/04/2021    HCT 31.2 (L) 04/04/2021    PLT 601 (H) 04/04/2021    MCV 89.1 04/04/2021    MCHC 31.4 (L) 04/04/2021     Lab Results   Component Value Date    NA 127 (L) 04/04/2021    K 5.2 04/04/2021    CL 91 (L) 04/04/2021    CO2 17 (L) 04/04/2021    BUN 13 04/04/2021    CREAT 0.60 04/04/2021    GLUCOSE 135 (H) 04/04/2021    CALCIUM 10.1 04/04/2021    MG 1.8 03/22/2021     Lab Results   Component Value Date    ALT 11 04/04/2021    AST 241 (H) 04/04/2021    BILITOT 0.4 04/04/2021    ALKPHOS 112 04/04/2021    ALBUMIN 3.0 (L) 04/04/2021     Lab Results   Component Value Date    APTT 35.6 03/22/2021    PT 14.8 (H) 04/04/2021    INR 1.2 04/04/2021       Microbiology   04/04/21:  COVID-19/flu PCR negative    Imaging   Bilateral LE [place] 04/04/21 (preliminary):  CONCLUSION:  1. Acute DVT involving one of the paired left peroneal veins.     PETCT 03/09/21:  IMPRESSION:   1.  Markedly enlarged myomatous uterus with intensely FDG-avid expansile endometrial lesion corresponding to known malignancy.  2.  Intensely FDG avid confluent retroperitoneal and bilateral iliac chain lymphadenopathy as well as at least 3 FDG avid intrathoracic nodal metastases, and right humeral head metastasis.  3.  Bilateral multifocal pulmonary emboli. No evidence of right heart strain, but clot burden appears significant.  4.  The urinary bladder is thickened for the degree of underdistention. Correlate with urinalysis.    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      # Abdominal pain,  POA  # Nausea/vomiting, POA - ongoing for the last month, possibly related to enlarging uterus with endometrial mass compressing surrounding GI organs. Also, sudden cessation of previous daily diarrhea raises concern for bowel obstruction. Pt and her daughter decline CT scans and request MRI.   - f/u KUB read  - MRI abdomen w/o contrast  - keep NPO except meds pending imaging results  - Zofran 4 mg IV q6h PRN for nausea  - oxycodone 5-10 mg q6h PRN for mod-severe pain    # Leukocytosis, POA - possibly related to an infection such as a UTI vs C diff given recent antibiotic use and watery diarrhea. Could also be related to malignancy and worsened by hemoconcentration.   - f/u UA  - check C diff PCR, enteric pathogen panel  - start empiric Zosyn (3/21-)     # Hyponatremia, POA - presented with Na 127. Most likely hypovolemic hyponatremia based on poor PO intake and GI losses.  - s/p 1L NS in ER  - start NS 100 cc/h  - recheck Na    # LLE DVT, POA - suspect this is the same DVT detected on [place] in 03/2021. Pt has not noticed increase in pain or swelling.  - hold Lovenox pending imaging results    # Normocytic anemia, POA - combination of ACI 2/2 malignancy and iron deficiency due to chronic vaginal bleeding. Pt not currently taking iron supplement. Currently near baseline.  - resume iron supplementation on discharge    # Thrombocytosis, POA - likely reactive in setting of malignancy and possibly infection.    # AGMA, POA - with low bicarb and AG of 19. Suspect lactic acidosis.  - check lactate    CHRONIC/STABLE:    # Metastatic high grade uterine carcinoma - diagnosed 01/2021. Followed by Dr. Kennon Holter. Has been undergoing biopsies to formulate treatment plan. Most recent plan was for neoadjuvant carboplatin per paclitaxel. Not started yet.   - f/u with Dr. Kennon Holter on discharge    # PE  - hold Lovenox as above    # FEN/GI: NPO pending imaging results  # Prophylaxis: hold Lovenox    # Tubes/Lines/Drains: none    Code Status: Full Code  Contact:  Primary Emergency Contact: Kibble,Tracy    # Disposition: Admit to Inpatient. I anticipate patient will be hospitalized for greater than two midnights.    75 minutes were spent personally by me today on this encounter which include today's pre-visit review of the chart, obtaining appropriate history, performing an evaluation, documentation and discussion of management with details supported within the note for today's visit. The time documented was exclusive of any time spent on the separately billed procedure.      Linnell Fulling, MD

## 2021-04-05 NOTE — ED Notes
COLLECTIVE?NOTIFICATION?04/04/2021 17:10?[place], Schae?MRN: [id number]    [facility]'s patient encounter information:   AVW:?[id number]  Account [id number]  Billing Account [id number]      Criteria Met      2 Visits in 30 Days    Security and Safety  No Security Events were found.  ED Care Guidelines  There are currently no ED Care Guidelines for this patient. Please check your facility's medical records system.          Prescription Drug Data  No Prescription Drug Data was found.    E.D. Visit Count (12 mo.)  Facility Visits   [facility] 1   [facility] 2   [facility] 1   Total 4   Note: Visits indicate total known visits.     Recent Emergency Department Visit Summary  Date Facility [facility] Type Diagnoses or Chief Complaint    Apr 04, 2021  [facility]  [place]     Mar 31, 2021  [facility]  [place]gency      1. Generalized Weakness      Mar 09, 2021  [facility]  [place].  [place]  Emergency      per pt has blood clot in her lungs.      Shortness of Breath      Other pulmonary embolism without acute cor pulmonale      Mar 09, 2021  [facility]  [place]      1. Shortness of Breath        Recent Inpatient Visit Summary  Date Facility [facility] Type Diagnoses or Chief Complaint    Mar 09, 2021  [facility].  [place].  [place]  Progressive Care      Other pulmonary embolism without acute cor pulmonale      Urinary tract infection, site not specified        Care Team  No Care Team was found.  This patient has registered at the [facility] Emergency Department   For more information visit: [email or web address]   PLEASE NOTE:     1.   Any care recommendations and other clinical information are provided as guidelines or for historical purposes only, and providers should exercise their own clinical judgment when providing care.    2.   You may only use this information for purposes of treatment, payment or health care operations activities, and subject to the limitations of applicable Collective Policies.    3.   You should consult directly with the organization that provided a care guideline or other clinical history with any questions about additional information or accuracy or completeness of information provided.    ? 2023 [place] - [email or web address]

## 2021-04-05 NOTE — Nursing Note
Follow up call received from daughter Analissa. Second page sent out to physician. Pager # [phone]

## 2021-04-06 ENCOUNTER — Non-Acute Institutional Stay: Payer: PRIVATE HEALTH INSURANCE | Attending: Geriatric Medicine

## 2021-04-06 ENCOUNTER — Ambulatory Visit: Payer: PRIVATE HEALTH INSURANCE

## 2021-04-06 ENCOUNTER — Telehealth: Payer: PRIVATE HEALTH INSURANCE

## 2021-04-06 DIAGNOSIS — R1310 Dysphagia, unspecified: Secondary | ICD-10-CM

## 2021-04-06 DIAGNOSIS — C541 Malignant neoplasm of endometrium: Secondary | ICD-10-CM

## 2021-04-06 DIAGNOSIS — E860 Dehydration: Secondary | ICD-10-CM

## 2021-04-06 DIAGNOSIS — R1084 Generalized abdominal pain: Secondary | ICD-10-CM

## 2021-04-06 DIAGNOSIS — R1032 Left lower quadrant pain: Secondary | ICD-10-CM

## 2021-04-06 DIAGNOSIS — R531 Weakness: Secondary | ICD-10-CM

## 2021-04-06 LAB — Bacterial Culture Urine: BACTERIAL CULTURE URINE: NO GROWTH {titer}

## 2021-04-06 NOTE — Progress Notes
Hematology Oncology Note    Patient name:  Alisha Orr  MRN:  [id number]  DOB:  03/07/1955  CSN: [id number]  Date of encounter: 04/06/2021  Referring provider: No ref. provider found  PCP: Trena Platt., DO  Provider: Kandis Fantasia, MD      PENDING NOTE    Reason for visit/cc: metastatic high grade uterine carcinoma    HPI:  History of uterine fibroids referred to oncology clinic for newly diagnosed metastatic high grade uterine cancer.  Course complicated by PE.  Patient with strong family history of cancer.  Patient was on unopposed estrogen for 10-12 years.    ?  ?  12/2020: vaginal bleeding   ?  02/07/2021: Still experiencing vaginal bleeding. ?  On exam, enlarged 20 week size uterus. ??  Pelvic US: Endometrium difficult to evaluate due to large fibroid (13 x 12 x 14 cm). Large fibroid visualized extending throughout entire uterus, possible intramural. There appears to be some fluid with the LUS along internal cervical os, wrapping along side the fibroid.   ?  02/07/21: ENDOMETRIUM (BIOPSY): [place] review  - Poorly differentiated high grade malignant neoplasm  Comment: Sections show solid sheets of a high grade necrotic tumor with small, round tumor cells showing high N/C ratio, hyperchromasia, and high mitotic activity. ?Immunohistochemically tumor cells show negative CK AE1/3 and ER, positive PAX8, P16, and cyclin D1, null-type aberrant P53, and some neuroendocrine expression (CD56). ?This uterine tumor has a broad differential diagnosis which includes undifferentiated carcinoma, high grade sarcoma, or a sarcoma component of MMMT  ?  03/09/21: PET with markedly enlarged myomatous uterus with intensely FDG-avid expansile endometrial lesion corresponding to known malignancy.?Intensely FDG avid confluent retroperitoneal and bilateral iliac chain lymphadenopathy as well as at least 3 FDG avid intrathoracic nodal metastases, and right humeral head metastasis.?Bilateral multifocal pulmonary emboli. No evidence of right heart strain, but clot burden appears significant. The urinary bladder is thickened for the degree of underdistention. Correlate with urinalysis  ?  03/09/21: presented to outside ER for evaluation of PE and discharged on lovenox  03/10/21: LE ultrasound with positive for acute partially occlusive DVT of the left posterior tibial vein. No evidence of deep or superficial venous thpombosis in the right lower extremity  04/04/21: left supraclavicular lymph node biopsy, path pending  04/04/21: LE ultrasound with acute DVT involving one of the paired left peroneal beins       Subjective:    Last seen 03/24/21  Urgent care 3/12 and 04/01/21, hydration, zofran  Biopsy 04/04/21, then ER. Declined CT scan   Left DVT  Simms Mann  Missed Dr Burke Keels appointment   Oxycodone 2.5mg  prn  AST elevated     [place] infusion      Accompanied by daughter.  Not doing well.  Reports fatigue, sob, decreased appetite, intermittent pelvic pain.  She is compliant with lovenox.  She recently had nausea/vomiting and diarrhea and attributes this to recent antibiotic treatment.  Nausea/vomiting and diarrhea are now resolved.  She has taken zofran with good response. Reports decreased appetite, but tolerating PO.  Reports intermittent pelvic pain.  Reports vaginal bleeding, but this is overall improved.  She is undergoing naturopathic treatment with a combination of cannabinoids.  She can perform basic ADLs, but needs assistance with cleaning, preparing food and organizing meds.  She can walk about 120ft with walker.         ECOG performance status: 2-3    Objective:  Current medications:  Outpatient Medications Prior to Visit  Medication Sig   ? cefuroxime 500 mg tablet Take 1 tablet (500 mg total) by mouth. (Patient not taking: Reported on 03/31/2021.)   ? cholecalciferol 125 mcg (5000 units) tablet Take 1 tablet (125 mcg total) by mouth. (Patient not taking: Reported on 03/31/2021.)   ? enoxaparin 100 mg/mL injection Inject 0.95 mLs (95 mg total) under the skin two (2) times daily.   ? ferrous sulfate 324 MG TBEC Take 65 mg by mouth.   ? furosemide 20 mg tablet Take 1 tablet (20 mg total) by mouth daily.   ? latanoprost 0.005% ophthalmic solution    ? levalbuterol (XOPENEX HFA) 45 mcg/act inhaler Inhale 2 puffs every six (6) hours as needed for Wheezing.   ? ondansetron ODT 4 mg disintegrating tablet Take 1-2 tablets (4-8 mg total) by mouth every eight (8) hours as needed for Nausea or Vomiting.   ? oxyCODONE 5 mg tablet Take 0.5 tablets (2.5 mg total) by mouth every six (6) hours as needed for Moderate Pain (Pain Scale 4-6) or Severe Pain (Pain Scale 7-10). Max Daily Amount: 10 mg   ? timolol 0.5% ophthalmic solution      No facility-administered medications prior to visit.       Allergies:  Allergies   Allergen Reactions   ? Codeine Other (See Comments)     REACTION: ''out of body experience''  REACTION: ''out of body experience''  REACTION: ''out of body experience''  REACTION: ''out of body experience''     ? Latex Hives and Rash       Review of Systems:  A 14 point review of systems was negative., except as per HPI     Past Medical History:  Past Medical History:   Diagnosis Date   ? Acute deep vein thrombosis (DVT) of proximal vein of lower extremity (HCC/RAF) 03/11/2021   ? Endometrial cancer (HCC/RAF)    ? Glaucoma    ? 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 ENDOMETRIAL BIOPSY     ? EYE SURGERY      detached retina, then removal of scar tissue   ? WISDOM TOOTH EXTRACTION         Social History:  Social History     Tobacco Use   ? Smoking status: Former     Types: Cigarettes   ? Smokeless tobacco: Never   Substance Use Topics   ? Alcohol use: Yes     Alcohol/week: 1.2 oz     Types: 2 Glasses of Wine (5 oz) per week     Family History:  Family History   Problem Relation Age of Onset   ? Pancreatic cancer Cousin Vitals:  BP: --  Temp: --  Temp Source: --  Heart Rate: --  Resp: --  SpO2: --  Height: --  Weight: --    Physical Exam:  General: Appears comfortable, no acute distress, fatigued  Head: Normocephalic, atraumatic.  Eyes: Without icterus.   Neck: Supple. Trachea midline.   CV: Regular in rate and rhythm  Chest: CTAB  Abdomen: Soft, nontender.  Large firm mass  Musculoskeletal:  Extremities are warm and well-perfused. +bilateral LE edema  Neurologic: Oriented to person, place and time.  Walks with walker   Hematologic: No bruising, purpura or petechiae are noted.   Dermatologic: No rashes appreciated.   Lymphatic: No palpable cervical  adenopathy.  +1.5cm firm left supraclavicular lymph node   Psychiatric: Affect appropriate.  Pleasant and conversant.     Recent Labs:  Lab Results   Component Value Date    NA 129 (L) 04/05/2021    K 4.7 04/05/2021    CL 96 04/05/2021    CO2 19 (L) 04/05/2021    CREAT 0.64 04/05/2021    BUN 13 04/05/2021    GLUCOSE 128 (H) 04/05/2021    CALCIUM 9.0 04/05/2021    MG 1.8 04/05/2021     Lab Results   Component Value Date    WBC 15.15 (H) 04/05/2021    HGB 8.5 (L) 04/05/2021    HCT 27.5 (L) 04/05/2021    MCV 90.2 04/05/2021    PLT 457 (H) 04/05/2021    NEUTPCT 86.7 04/05/2021    NEUTABS 13.14 (H) 04/05/2021    MONOPCT 6.5 04/05/2021    MONOABS 0.98 (H) 04/05/2021     Lab Results   Component Value Date    TOTPRO 7.8 04/04/2021    ALBUMIN 3.0 (L) 04/04/2021    BILITOT 0.4 04/04/2021    AST 241 (H) 04/04/2021    ALT 11 04/04/2021    ALKPHOS 112 04/04/2021    AMYLASE 19 (L) 03/22/2021    LIPASE 9 (L) 03/22/2021       Impression and Recommendations:  Alisha Orr is a 66 y.o. 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  Course complicated by PE/DVT on lovenox.  Discussed diagnosis, prognosis, and treatment options.  Patient understands treatment is with palliative intent.  Discussed risks/benefits of chemotherapy.  Discussed side effects of carboplatin plus paclitaxel including but not limited to fatigue, GI toxicity, rash, peripheral neuropathy, alopecia, myelosuppression, infection, abnormal bleeding.  Patient is currently undergoing naturopathic treatment and will let me know if she would like to proceed with chemotherapy.  All questions answered.  Patient amenable to plan.         T&S    ?  -plan for neoadjuvant carboplatin plus paclitaxel, C1D1 04/07/21   -can consider adding pembrolizumab in future, discussed that this is not SOC  -monitor CBC with diff, CMP, d-dimer                -CA125 not a good marker  -per pathology tissue necrotic.  Agree with LN biopsy for MMR, Her2, NGS, completed 04/04/21   -follow up pathology    -will send for CARIS  -recommend bisphosphonate therapy in setting of bone mets                -recommend dental clearance.  Patient was seen recently and will call to confirm ok to proceed                 -consider palliative xrt to bone if symptomatic   -ordered IV iron per patient preference   -continue lovenox  -plan for repeat imaging after cycle 2-3  -plan for port after cycle 1  -referred to genetics  -referred to palliative care for GOC and symptom management, scheduled 04/28/21     -patient previously declined GOC discussion  -transfusion consent signed (03/24/21)  -follow up cards, pulm  -return precautions given   ?  ?  Symptom management:  -nausea: zofran ODT prn, hydration prn  -pain: oxycodone 2.5mg  prn   -diarrhea: imodium prn  -constipation: miralax prn, senna prn  ?  ?  Treatment team:  -Dr Perfecto Kingdom  -Dr Melody Comas   -Dr Jamse Belfast  Merchant (pulm)   -Simms Mann/Valentina Ogaryan       I spent ___ minutes counseling and coordinating care for the items below on the day of service:  -preparing to see the patient  -obtaining and or reviewing separately obtained history   -performing a medically appropriate examination or evaluation   -counseling and educating the patient/family  -ordering medications, tests, procedures  -communicating with other healthcare professionals   -documenting clinical information in the EHR         Return to clinic:  No follow-ups on file.    Orders:  No orders of the defined types were placed in this encounter.        Attending Physician: Kandis Fantasia, MD.  Author: Kandis Fantasia, MD

## 2021-04-07 ENCOUNTER — Non-Acute Institutional Stay: Payer: PRIVATE HEALTH INSURANCE

## 2021-04-07 ENCOUNTER — Ambulatory Visit: Payer: PRIVATE HEALTH INSURANCE

## 2021-04-07 ENCOUNTER — Telehealth: Payer: PRIVATE HEALTH INSURANCE

## 2021-04-07 DIAGNOSIS — R5381 Other malaise: Secondary | ICD-10-CM

## 2021-04-07 DIAGNOSIS — I824Y2 Acute embolism and thrombosis of unspecified deep veins of left proximal lower extremity: Secondary | ICD-10-CM

## 2021-04-07 DIAGNOSIS — K219 Gastro-esophageal reflux disease without esophagitis: Secondary | ICD-10-CM

## 2021-04-07 DIAGNOSIS — R7303 Prediabetes: Secondary | ICD-10-CM

## 2021-04-07 DIAGNOSIS — D500 Iron deficiency anemia secondary to blood loss (chronic): Secondary | ICD-10-CM

## 2021-04-07 DIAGNOSIS — E6609 Other obesity due to excess calories: Secondary | ICD-10-CM

## 2021-04-07 DIAGNOSIS — D259 Leiomyoma of uterus, unspecified: Secondary | ICD-10-CM

## 2021-04-07 DIAGNOSIS — C779 Secondary and unspecified malignant neoplasm of lymph node, unspecified: Secondary | ICD-10-CM

## 2021-04-07 DIAGNOSIS — Z09 Encounter for follow-up examination after completed treatment for conditions other than malignant neoplasm: Secondary | ICD-10-CM

## 2021-04-07 DIAGNOSIS — R1084 Generalized abdominal pain: Secondary | ICD-10-CM

## 2021-04-07 DIAGNOSIS — E559 Vitamin D deficiency, unspecified: Secondary | ICD-10-CM

## 2021-04-07 DIAGNOSIS — C7951 Secondary malignant neoplasm of bone: Secondary | ICD-10-CM

## 2021-04-07 LAB — Tissue Exam

## 2021-04-07 NOTE — Telephone Encounter
Daughter walked into clinic and spoke to [place], [place] further assisted in scheduling proper appointment for a hospital follow up since [facility] had scheduled on with Dr. Senaida Ores (not patient's PCP) for a 15 min video visit. No further action needed.

## 2021-04-07 NOTE — Telephone Encounter
PDL Call to Clinic    Reason for Call: Madaline Savage with [facility] requesting to speak with Office staff to provide  Dr. Vernetta Honey Phone #, did not want to provide on pt's chart.    Appointment Related?  '[]'$  Yes  '[x]'$  No     If yes;  Date:  Time:    Call warm transferred to PDL: '[x]'$  Yes  '[]'$  No    Call Received by Clinic Representative: Rise Paganini    If call not answered/not accepted, call received by Patient Services Representative:

## 2021-04-07 NOTE — Telephone Encounter
Discharge Followup Appointment Info    Is the patient being discharged to SNF?: No   TIme Frame: Within 7 Days   Sched with PCP?: Patient's [place] PCP   PCP Name: Shiela Mayer., DO   Date and Time of appt: 04/07/21  5:45 PM PDT   Did you schedule any Specialty visits?: Yes   Were there any scheduling issues?: No   Additional Appts/Tests/Info   Future Appointments  04/07/2021  5:45 PM    Haynes Dage M., DO       [facility] [place]  04/21/2021   11:20 AM   Avanell Shackleton., MD     [facility] [phone]        [place]    Called pt at [phone] LVM   Mailed out letter.   Will f/u

## 2021-04-07 NOTE — Telephone Encounter
Message to Practice/Provider      Message: Advised Pt cannot be seen for Video Visit for Hospital Follow-Up's, Pt's Daughter [name] is upset as she is now arriving to clinic at 6:55PM. Pt daughter states she could not advise office in timely manner as long hold/wait time prior to receiving call. Advised no other openings per staff      Return call is not being requested by the patient or caller.    Patient or caller has been notified of the turnaround time of 1-2 business day(s).

## 2021-04-07 NOTE — Telephone Encounter
S/w pt - advised per MD pt should have tx regardless of biopsy results.     Pt wanted to cancel appt regardless & will be r/s

## 2021-04-08 ENCOUNTER — Telehealth: Payer: PRIVATE HEALTH INSURANCE

## 2021-04-08 ENCOUNTER — Ambulatory Visit: Payer: PRIVATE HEALTH INSURANCE

## 2021-04-08 DIAGNOSIS — E869 Volume depletion, unspecified: Secondary | ICD-10-CM

## 2021-04-08 LAB — Amylase: AMYLASE: 190 U/L — ABNORMAL HIGH (ref 31–124)

## 2021-04-08 LAB — Hgb A1c: HGB A1C - HPLC: 5.4 (ref <5.7)

## 2021-04-08 LAB — Iron & Iron Binding Capacity: IRON BINDING CAPACITY: 159 ug/dL — ABNORMAL LOW (ref 262–502)

## 2021-04-08 LAB — Magnesium: MAGNESIUM: 1.9 meq/L (ref 1.4–1.9)

## 2021-04-08 LAB — Lipase: LIPASE: 621 U/L — ABNORMAL HIGH (ref 13–69)

## 2021-04-08 LAB — Uric Acid: URIC ACID: 7.3 mg/dL — ABNORMAL HIGH (ref 2.9–7.0)

## 2021-04-08 LAB — Ferritin: FERRITIN: 270 ng/mL — ABNORMAL HIGH (ref 8–180)

## 2021-04-08 LAB — TSH with reflex FT4, FT3: TSH: 2.9 u[IU]/mL (ref 0.3–4.7)

## 2021-04-08 MED ORDER — SENNOSIDES 8.6 MG PO TABS
1 | ORAL_TABLET | Freq: Two times a day (BID) | ORAL | 6 refills | Status: AC | PRN
Start: 2021-04-08 — End: ?

## 2021-04-08 NOTE — Telephone Encounter
Referral Request    1) Patient is requesting a referral to: Pt daughter calling re: referral #[id number] DME wheel chair   It was issued yesterday 04/07/21 the said dr told them it would be done today 04/08/21 they want wheel chair today         Specific location?    Particular MD in mind?     2) The issue (diagnosis, symptoms):     3) Has patient been seen by their doctor for this issue?      4) Was an appointment offered?     5) Patient's last office visit: 04/07/21     Patient or caller has been notified of the turnaround time of 1-2 business day(s).

## 2021-04-08 NOTE — Telephone Encounter
Left msg for pt to callback for scheduling of D1C1 tx.  Requested 04/21/21

## 2021-04-08 NOTE — Progress Notes
Hospital discharge facility: [facility]  Admission date: 04/04/2021  Discharge date: 04/05/2021  Discharge diagnosis: INTRACTABLE N/V LIKELY D/T GI COMPRESSION FROM ENLARGING UTERINE MASSES BOTH BENIGN FIBROID AS WELL AS METASTATIC UTERINE CANCER, ELECTROLYTE IMBALANCES, DEHYDRATION  Date of interactive contact with patient:  Date of face-to-face physician visit: 04/07/2021      Insert Progress Note Here: [facility] records reviewed today including: admitting history and physical, discharge summary, diagnostic studies, imaging studies and home medication reconciliation.     CPT Code 30160 met requiring:  ? Communication (direct contact, telephone, electronic) with the patient and/or caregiver within 2 business days of discharge.  ? Medical decision making of at least moderate complexity during the service period  ? Face to face visit with the patient within 14 calendar days of discharge (which should include obtaining and reviewing the discharge summary, medication reconciliation, etc.)      CPT Code 10932 met requiring:  ? Communication (direct contact, telephone, electronic) with the patient and/or caregiver within 2 business days of discharge  ? Medical decision making of high complexity during the service period  ? Face to face visit within 7 calendar days of discharge (which should include obtaining and reviewing the discharge summary, medication reconciliation, etc.)          PATIENT: Alisha Orr  MRN: [id number]  DOB: April 21, 1955  DATE OF SERVICE: 04/07/2021    CHIEF COMPLAINT:   Chief Complaint   Patient presents with   ? Post Hospital Discharge Follow Up     Wheelchair, compression stockings, home palliative care       Past Medical History:   Diagnosis Date   ? Acute deep vein thrombosis (DVT) of proximal vein of lower extremity (HCC/RAF) 03/11/2021   ? Endometrial cancer (HCC/RAF)    ? Glaucoma    ? 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 ENDOMETRIAL BIOPSY     ? EYE SURGERY      detached retina, then removal of scar tissue   ? WISDOM TOOTH EXTRACTION         Social History     Tobacco Use   Smoking Status Former   ? Types: Cigarettes   Smokeless Tobacco Never       Family History   Problem Relation Age of Onset   ? Pancreatic cancer Cousin        Social History     Substance and Sexual Activity   Alcohol Use Yes   ? Alcohol/week: 1.2 oz   ? Types: 2 Glasses of Wine (5 oz) per week       Social History     Substance and Sexual Activity   Drug Use Yes   ? Types: Other-see comments    Comment: Rick simpson oil       Allergies   Allergen Reactions   ? Codeine Other (See Comments)     REACTION: ''out of body experience''  REACTION: ''out of body experience''  REACTION: ''out of body experience''  REACTION: ''out of body experience''     ? Latex Hives and Rash       Patient Active Problem List   Diagnosis   ? Adenocarcinoma of endometrium (HCC/RAF)   ? Allergic rhinitis   ? BMI 32.0-32.9,adult   ? Obesity   ? Dyspareunia   ? Dysphagia   ? Disequilibrium   ?  Dysphonia   ? Ear ringing   ? Endometrial cancer (HCC/RAF)   ? GERD (gastroesophageal reflux disease)   ? Hearing loss, sensorineural, asymmetrical   ? History of detached retina repair   ? Impaired hearing   ? Iron deficiency anemia, unspecified   ? Laryngopharyngeal reflux   ? Left lower quadrant pain   ? Leiomyoma of uterus, unspecified   ? 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 Menopausal symptom   ? Prediabetes   ? Secondary open-angle glaucoma   ? Temporomandibular joint disorder   ? Vitamin D deficiency   ? 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 Acute deep vein thrombosis (DVT) of proximal vein of lower extremity (HCC/RAF)   ? Iron deficiency anemia   ? Dehydration   ? Abdominal pain   ? Intractable nausea and vomiting         Current Outpatient Medications: ?  enoxaparin 100 mg/mL injection, Inject 0.95 mLs (95 mg total) under the skin two (2) times daily., Disp: 60 mL, Rfl: 1  ?  furosemide 20 mg tablet, Take 1 tablet (20 mg total) by mouth daily., Disp: , Rfl:   ?  latanoprost 0.005% ophthalmic solution, , Disp: , Rfl:   ?  levalbuterol (XOPENEX HFA) 45 mcg/act inhaler, Inhale 2 puffs every six (6) hours as needed for Wheezing., Disp: 15 g, Rfl: 3  ?  ondansetron ODT 4 mg disintegrating tablet, Take 1-2 tablets (4-8 mg total) by mouth every eight (8) hours as needed for Nausea or Vomiting., Disp: 60 tablet, Rfl: 1  ?  oxyCODONE 5 mg tablet, Take 0.5 tablets (2.5 mg total) by mouth every six (6) hours as needed for Moderate Pain (Pain Scale 4-6) or Severe Pain (Pain Scale 7-10). Max Daily Amount: 10 mg, Disp: 30 tablet, Rfl: 0  ?  timolol 0.5% ophthalmic solution, , Disp: , Rfl:   ?  cefuroxime 500 mg tablet, Take 1 tablet (500 mg total) by mouth. (Patient not taking: Reported on 03/31/2021.), Disp: , Rfl:   ?  cholecalciferol 125 mcg (5000 units) tablet, Take 1 tablet (125 mcg total) by mouth. (Patient not taking: Reported on 03/31/2021.), Disp: , Rfl:   ?  ferrous sulfate 324 MG TBEC, Take 65 mg by mouth. (Patient not taking: Reported on 04/07/2021.), Disp: , Rfl:   ?  senna 8.6 mg tablet, Take 1 tablet by mouth two (2) times daily as needed for Constipation., Disp: 60 tablet, Rfl: 6  No current facility-administered medications for this visit.    Health Maintenance   Topic Date Due   ? COVID-19 Vaccine(Tracks primary and booster doses, not sup/immunocomp) (1) Never done   ? Hepatitis B Screening  Never done   ? Hepatitis C Screening  Never done   ? Shingles (Shingrix) Vaccine (1 of 2) Never done   ? Colorectal Cancer Screening  Never done   ? Annual Preventive Wellness Visit  Never done   ? Advance Directive  Never done   ? Pneumococcal Vaccine (1 - PCV) Never done   ? Osteoporosis Early Detection DEXA Scan  Never done   ? Tdap/Td Vaccine (2 - Td or Tdap) 04/24/2020 ? Influenza Vaccine (1) 09/16/2020   ? Breast Ca Screening: MAMMOGRAM  03/19/2023   ? Prediabetes Screening (See hover text)  04/05/2024       Patient Care Team:  Trena Platt., DO as PCP - General (Family  Medicine)  Audree Bane as Ambulatory Care Coordinator  Faith Rogue, MD as Consulting Physician (Medicine, Palliative Medicine)      Subjective:      Doylene Splinter is a 66 y.o. female.    Review of Systems   Constitutional: Positive for activity change and fatigue (EXACERBATED BY MORPHINE IN HOSPITAL AND THC/CBD AT HOME). Negative for appetite change (SLIGHTLY IMPROVED POST HOSPITALIZATION), chills, diaphoresis, fever and unexpected weight change.   HENT: Negative.  Negative for congestion, dental problem, drooling, ear discharge, ear pain, facial swelling, hearing loss, mouth sores, nosebleeds, postnasal drip, rhinorrhea, sinus pressure, sinus pain, sneezing, sore throat, tinnitus, trouble swallowing and voice change.    Eyes: Negative.  Negative for photophobia, pain, discharge, redness, itching and visual disturbance.   Respiratory: Negative.  Negative for apnea, cough, choking, chest tightness, shortness of breath, wheezing and stridor.    Cardiovascular: Negative.  Negative for chest pain, palpitations and leg swelling.   Gastrointestinal: Positive for abdominal pain, diarrhea and vomiting (IMPROVING). Negative for abdominal distention, anal bleeding, blood in stool, constipation (RESOLVED AND CHANGED TO DIARRHEA), nausea and rectal pain.   Endocrine: Negative.  Negative for cold intolerance, heat intolerance, polydipsia, polyphagia and polyuria.   Genitourinary: Positive for vaginal bleeding (DAUGHTER REPORTS MUCH LESS THAN BEFORE - UTERINE CANCER AND FIBROIDS). Negative for decreased urine volume, difficulty urinating, dyspareunia, dysuria, enuresis, flank pain, frequency, genital sores, hematuria, menstrual problem, pelvic pain, urgency, vaginal discharge and vaginal pain.   Musculoskeletal: Positive for back pain (CHRONIC ISSUE DUE TO LACK OF MOVEMENT). Negative for arthralgias, gait problem, joint swelling, myalgias, neck pain and neck stiffness.   Skin: Negative.  Negative for color change, pallor, rash and wound.   Allergic/Immunologic: Negative.  Negative for environmental allergies, food allergies and immunocompromised state.   Neurological: Positive for weakness. Negative for dizziness, tremors, seizures, syncope, facial asymmetry, speech difficulty, light-headedness, numbness and headaches.   Hematological: Negative.  Negative for adenopathy. Does not bruise/bleed easily.   Psychiatric/Behavioral: Positive for confusion, decreased concentration and dysphoric mood. Negative for agitation, behavioral problems, hallucinations, self-injury, sleep disturbance and suicidal ideas. The patient is not nervous/anxious and is not hyperactive.    All other systems reviewed and are negative.        Objective:      Physical Exam  Vitals reviewed.   Constitutional:       General: She is not in acute distress.     Appearance: Normal appearance. She is well-developed and normal weight. She is not ill-appearing, toxic-appearing or diaphoretic.      Comments: BP 98/66  ~ Pulse 97  ~ Temp 36.6 ?C (97.9 ?F) (Tympanic)  ~ SpO2 98%     LETHARGIC IN WHEELCHAIR.  IS AROUSABLE BY VOICE, BUT THEN FALLS RIGHT BACK TO SLEEP.  DAUGHTER REPORTS PT MORE SUSCEPTIBLE TO THC AFTER THE HOSPITALIZATION AND THIS IS WHAT CAUSED THE LETHARGY PER FAMILY   HENT:      Head: Normocephalic and atraumatic.      Right Ear: Tympanic membrane, ear canal and external ear normal. There is no impacted cerumen.      Left Ear: Tympanic membrane, ear canal and external ear normal. There is no impacted cerumen.      Nose: Nose normal. No congestion or rhinorrhea.      Mouth/Throat:      Mouth: Mucous membranes are moist.      Pharynx: Oropharynx is clear. No oropharyngeal exudate or posterior oropharyngeal erythema.   Eyes:      General:  No scleral icterus.        Right eye: No discharge.         Left eye: No discharge.      Extraocular Movements: Extraocular movements intact.      Conjunctiva/sclera: Conjunctivae normal.      Pupils: Pupils are equal, round, and reactive to light.   Neck:      Thyroid: No thyromegaly.      Vascular: No carotid bruit.   Cardiovascular:      Rate and Rhythm: Normal rate and regular rhythm.      Pulses: Normal pulses.      Heart sounds: Normal heart sounds. No murmur heard.    No friction rub. No gallop.   Pulmonary:      Effort: Pulmonary effort is normal. No respiratory distress.      Breath sounds: Normal breath sounds. No stridor. No wheezing, rhonchi or rales.   Chest:      Chest wall: No tenderness.   Abdominal:      General: Abdomen is flat. Bowel sounds are normal. There is no distension.      Palpations: Abdomen is soft. There is mass (KNOWN UTERINE CANCER AND FIBROIDS APPRECIATED THROUGHOUT ABDOMINAL CAVITY).      Tenderness: There is no abdominal tenderness. There is no right CVA tenderness, left CVA tenderness, guarding or rebound.      Hernia: No hernia is present.   Musculoskeletal:         General: No swelling, tenderness, deformity or signs of injury. Normal range of motion.      Cervical back: Normal range of motion and neck supple. No rigidity. No muscular tenderness.      Right lower leg: Edema (HAS COMPRESSION SOCKS ON) present.      Left lower leg: Edema present.   Lymphadenopathy:      Cervical: No cervical adenopathy.   Skin:     General: Skin is warm and dry.      Capillary Refill: Capillary refill takes less than 2 seconds.      Coloration: Skin is not jaundiced or pale.      Findings: No bruising, erythema, lesion or rash.   Neurological:      General: No focal deficit present.      Mental Status: She is alert and oriented to person, place, and time.      Cranial Nerves: No cranial nerve deficit.      Sensory: No sensory deficit.      Motor: Weakness present. No abnormal muscle tone.      Coordination: Coordination normal.      Gait: Gait abnormal (WHEELCHAIR BOUND).      Deep Tendon Reflexes: Reflexes are normal and symmetric. Reflexes normal.   Psychiatric:         Mood and Affect: Mood normal.         Behavior: Behavior normal.         Thought Content: Thought content normal.         Judgment: Judgment normal.             ASSESSMENT AND PLAN     1. Hospital discharge follow-up    2. Acute deep vein thrombosis (DVT) of proximal vein of left lower extremity (HCC/RAF)    3. Obesity due to excess calories without serious comorbidity, unspecified classification    4. Prediabetes    5. Vitamin D deficiency    6. Gastroesophageal reflux disease without esophagitis    7. Generalized abdominal pain  8. Uterine leiomyoma, unspecified location    9. 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 Adenocarcinoma of endometrium (HCC/RAF)    12. Endometrial cancer (HCC/RAF)    13. Iron deficiency anemia due to chronic blood loss    14. Physical deconditioning      SOME SX'S IMPROVED, BUT APPEARS TO BE OVER SEDATED.  DAUGHTERS WILL LOWER THC AND RAMP BACK UP AS NEEDED AND TOLERATED    FAMILY WOULD PREFER MRI VS CT SCAN AS ''SAFER'' WAY TO IMAGE ABDOMEN.  BUT AT THIS TIME DOUBT NECESSITY AS NO LONGER CONCERNED FOR OBSTRUCTION - HAVING BM'S AND HAS SENNA READY FOR WHEN/ IF NEED NARCOTIC PAIN MEDICATION    FAMILY REQUESTS DME FOR WHEELCHAIR, HOME PALLIATIVE CARE, AND OTHER RESOURCES.  I AGREE AND WILL PLACE REFERRAL FOR THAT.      RECHECK ABNORMAL LABS SUCH AS ABNORMAL CBC, HYPONATREMIA, AS WELL AS OVERALL NUTRITION STATUS.      OVERALL PROGNOSIS DEPENDS ON TYPE OF UTERINE CANCER SUCH AS SARCOMA OR MIXED TYPE WITH SARCOMA.  SAW 2ND OPINION AT [facility] WHO SAID IF IT WAS SARCOMA, THEY DON'T EVEN ATTEMPT ANY TREATMENT AND RECOMMEND HOSPICE.  AWAITING BIOPSY RESULTS FROM LYMPH NODE.  SO FAR KNOWN LN METS AND METS TO HUMERAL HEAD.  COMPLICATING PICTURE AND PROGNOSIS IS VERY LARGE FIBROIDS ADDING TO BULK PRESSURE UP INTO ABDOMINAL CAVITY.  IT IS KNOWN AT THIS TIME, PATIENT IS NOT A GOOD SURGICAL CANDIDATE ON MANY FRONTS AND RECOMMEND STARTING CHEMO SOONER THAN LATER (THEY MENTION TOMORROW?).      Orders Placed This Encounter   ? CBC & Auto Differential   ? Folate,Serum   ? Hgb A1c   ? Lipid Panel   ? Vitamin D,25-Hydroxy   ? Vitamin B12   ? Urinalysis w/Reflex to Culture   ? TSH with reflex FT4, FT3   ? PTH, Intact   ? Magnesium   ? Uric Acid   ? Comprehensive Metabolic Panel   ? Prealbumin   ? Amylase   ? Lipase   ? Iron & Iron Binding Capacity   ? Ferritin   ? Referral to Palliative Care (Clinic, Home or SNF)   ? DISCONTD: senna 8.6 mg tablet   ? senna 8.6 mg tablet       Wt Readings from Last 5 Encounters:   04/05/21 207 lb 3.2 oz (94 kg)   04/04/21 206 lb 2.1 oz (93.5 kg)   04/01/21 203 lb (92.1 kg)   03/28/21 209 lb (94.8 kg)   03/27/21 206 lb (93.4 kg)     BP Readings from Last 5 Encounters:   04/07/21 98/66   04/05/21 107/92   04/04/21 96/71   04/01/21 101/71   03/28/21 136/85       Hgb A1c - HPLC   Date Value Ref Range Status   03/22/2021 5.4 <5.7 % Final     Comment:     For patients with diabetes, an A1c less than (<) or equal (=) to 7.0% is recommended for most patients, however the goal may be higher or lower depending on age and/or other medical problems.   For a diagnosis of diabetes, A1c greater than (>) or equal(=) to 6.5% indicates diabetes; values between 5.7% and 6.4% may indicate an increased risk of developing diabetes.     Cholesterol   Date Value Ref Range Status  03/22/2021 155 See Comment mg/dL Final     Comment:     The significance of total cholesterol depends on the values of LDL, HDL, triglycerides and the clinical context. A patient-provider discussion may be considered.         Cholesterol,LDL,Calc   Date Value Ref Range Status   03/22/2021 70 <100 mg/dL Final     Comment:     If LDL value falls outside of the designated range AND if  included in any of the following categories, a  patient-provider discussion is recommended.     Statin therapy is recommended for individuals:  1. with clinical atherosclerotic cardiovascular disease     irrespective of LDL levels;  2. with LDL > or = 190 mg/dL;  3. with diabetes, aged 40-75 years, with LDL between 70 and     189 mg/dL;  4. without any of the above but who have LDL between 70 and     189 mg/dL and an estimated 82-NFAO risk of     atherosclerotic cardiovascular disease > or = 7.5%     (consider statin therapy if estimated 10-year risk > or =     5.0%) (ACC/AHA 2013 Guidelines).     Cholesterol, HDL   Date Value Ref Range Status   03/22/2021 62 >50 mg/dL Final     Comment:     If HDL cholesterol level falls outside of the designated  range, a patient-provider discussion is recommended     Triglycerides   Date Value Ref Range Status   03/22/2021 114 <150 mg/dL Final     Comment:     If Triglyceride level falls outside of the designated range,  a patient-provider discussion is recommended.        Creatinine   Date Value Ref Range Status   04/05/2021 0.64 0.60 - 1.30 mg/dL Final   13/08/6576 4.69 0.60 - 1.30 mg/dL Final   62/95/2841 3.24 0.60 - 1.30 mg/dL Final        PHQ-9 Results  Depression Screening (Patient Health Questionnaire PHQ) 03/06/2021   PHQ-2: Feeling down, depressed, or hopeless No   PHQ-2: Little interest or pleassure in doing things No       GAD-7 Results  No flowsheet data found.    DAST Results  No flowsheet data found.    Audit-C results  No flowsheet data found.      Return in 2 months (on 06/07/2021).  The above plan of care, diagnosis, orders, and follow-up were discussed with the patient.  Questions related to this recommended plan of care were answered.  Lonzo Cloud. Karolee Ohs, DO  04/07/2021    Author:  Lonzo Cloud. Alicea Wente 04/07/2021 8:46 PM

## 2021-04-09 MED ADMIN — SODIUM CHLORIDE 0.9 % IV BOLUS: 1000 mL | INTRAVENOUS | @ 01:00:00 | Stop: 2021-04-09

## 2021-04-09 NOTE — Progress Notes
CHIEF COMPLAINT:     Chief Complaint   Patient presents with   ? Fatigue   ? Dehydration       SUBJECTIVE:     Alisha Orr is a 66 y.o. female with an active medical problem list as outlined below presents to clinic with concern for dehydration.   Patient was evaluated by her PCP yesterday for post discharge follow up.   Since her visit, today she is nauseated, daughter reports reduced oral intake  Patient denies any new pain. No BM reported  No fever or chills reported.   Denies any dyspnea or chest pain at rest    Brief Review of Systems  As above    Problem List  Patient Active Problem List   Diagnosis   ? Adenocarcinoma of endometrium (HCC/RAF)   ? Allergic rhinitis   ? BMI 32.0-32.9,adult   ? Obesity   ? Dyspareunia   ? Dysphagia   ? Disequilibrium   ? Dysphonia   ? Ear ringing   ? Endometrial cancer (HCC/RAF)   ? GERD (gastroesophageal reflux disease)   ? Hearing loss, sensorineural, asymmetrical   ? History of detached retina repair   ? Impaired hearing   ? Iron deficiency anemia, unspecified   ? Laryngopharyngeal reflux   ? Left lower quadrant pain   ? Leiomyoma of uterus, unspecified   ? 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 Menopausal symptom   ? Prediabetes   ? Secondary open-angle glaucoma   ? Temporomandibular joint disorder   ? Vitamin D deficiency   ? 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 Acute deep vein thrombosis (DVT) of proximal vein of lower extremity (HCC/RAF)   ? Iron deficiency anemia   ? Dehydration   ? Abdominal pain   ? Intractable nausea and vomiting       Allergies:   Allergies   Allergen Reactions   ? Codeine Other (See Comments)     REACTION: ''out of body experience''  REACTION: ''out of body experience''  REACTION: ''out of body experience''  REACTION: ''out of body experience''     ? Latex Hives and Rash       Meds Reviewed  Current Outpatient Medications   Medication Sig   ? enoxaparin 100 mg/mL injection Inject 0.95 mLs (95 mg total) under the skin two (2) times daily.   ? ferrous sulfate 324 MG TBEC Take 65 mg by mouth.   ? latanoprost 0.005% ophthalmic solution    ? levalbuterol (XOPENEX HFA) 45 mcg/act inhaler Inhale 2 puffs every six (6) hours as needed for Wheezing.   ? ondansetron ODT 4 mg disintegrating tablet Take 1-2 tablets (4-8 mg total) by mouth every eight (8) hours as needed for Nausea or Vomiting.   ? oxyCODONE 5 mg tablet Take 0.5 tablets (2.5 mg total) by mouth every six (6) hours as needed for Moderate Pain (Pain Scale 4-6) or Severe Pain (Pain Scale 7-10). Max Daily Amount: 10 mg   ? timolol 0.5% ophthalmic solution    ? cefuroxime 500 mg tablet Take 1 tablet (500 mg total) by mouth. (Patient not taking: Reported on 03/31/2021.)   ? cholecalciferol 125 mcg (5000 units) tablet Take 1 tablet (125 mcg total) by mouth. (Patient not taking: Reported on 03/31/2021.)   ? furosemide 20 mg tablet Take 1 tablet (20 mg total) by mouth daily. (Patient not taking: Reported on 04/08/2021.)   ?  senna 8.6 mg tablet Take 1 tablet by mouth two (2) times daily as needed for Constipation. (Patient not taking: Reported on 04/08/2021.)   ? [DISCONTINUED] enoxaparin 100 mg/mL injection Inject 0.95 mLs (95 mg total) under the skin.   ? [DISCONTINUED] ferrous sulfate 324 MG TBEC Take 1 tablet (324 mg total) by mouth. (Patient not taking: Reported on 03/22/2021.)   ? [DISCONTINUED] fish oil 1000 mg capsule Take 1 capsule (1,000 mg total) by mouth. (Patient not taking: Reported on 03/22/2021.)   ? [DISCONTINUED] naproxen 500 mg tablet    ? [DISCONTINUED] senna 8.6 mg tablet Take 1 tablet by mouth. (Patient not taking: Reported on 04/07/2021.)     No current facility-administered medications for this visit.     Facility-Administered Medications Ordered in Other Visits   Medication Dose Route Frequency   ? [COMPLETED] enoxaparin 100 mg/mL inj 95 mg  1 mg/kg Subcutaneous STAT   ? [COMPLETED] fentaNYL (PF) 100 mcg/2 mL inj Intravenous PRN   ? [COMPLETED] fluconazole tab 150 mg  150 mg Oral Once   ? [COMPLETED] midazolam (PF) 1 mg/mL inj   IV Push PRN   ? [COMPLETED] ondansetron 4 mg/2 mL inj   IV Push PRN   ? [COMPLETED] piperacillin/tazobactam 3.375 g in dextrose 50 mL IVPB RTU  3.375 g Intravenous Once   ? [COMPLETED] sodium chloride 0.9% IV soln bolus 1,000 mL  1,000 mL Intravenous STAT   ? [COMPLETED] sodium chloride 0.9% IV soln bolus 1,000 mL  1,000 mL Intravenous Once   ? [DISCONTINUED] acetaminophen tab 650 mg  650 mg Oral Q6H PRN   ? [DISCONTINUED] enoxaparin 100 mg/mL inj 95 mg  95 mg Subcutaneous BID   ? [DISCONTINUED] lidocaine 4% cream   Topical Once PRN   ? [DISCONTINUED] magnesium sulfate 2 g in water for injection 50 mL RTU  2 g Intravenous PRN   ? [DISCONTINUED] magnesium sulfate 4 g in water for injection 100 mL RTU  4 g Intravenous PRN   ? [DISCONTINUED] ondansetron 4 mg/2 mL inj 4 mg  4 mg Intravenous Q6H PRN   ? [DISCONTINUED] oxyCODONE tab 10 mg  10 mg Oral Q6H PRN   ? [DISCONTINUED] oxyCODONE tab 5 mg  5 mg Oral Q6H PRN   ? [DISCONTINUED] oxyCODONE tab 5 mg  5 mg Oral Q6H PRN   ? [DISCONTINUED] piperacillin/tazobactam 3.375 g in dextrose 50 mL IVPB RTU  3.375 g Intravenous Q8H   ? [DISCONTINUED] piperacillin/tazobactam 3.375 g in dextrose 50 mL IVPB RTU  3.375 g Intravenous Q8H   ? [DISCONTINUED] polyethylene glycol pwd pkt 17 g  17 g Oral BID   ? [DISCONTINUED] senna tab 1 tablet  1 tablet Oral BID   ? [DISCONTINUED] sodium chloride 0.9% IV soln  100 mL/hr Intravenous Continuous   ? [DISCONTINUED] sodium chloride 0.9% IV soln  125 mL/hr Intravenous Continuous       Smoking History   reports that she has quit smoking. Her smoking use included cigarettes. She has never used smokeless tobacco.    OBJECTIVE:     Physical Exam:    Vitals Noted   Vitals:    04/08/21 1718   BP: 103/68   Pulse: (!) 102   Resp: 18   Temp: 36.3 ?C (97.3 ?F)   TempSrc: Tympanic   SpO2: 98%       GENERAL: alert, NAD    HEENT: NC/AT, PERRLA, EOMI  NECK: supple, no JVD  LUNGS: CTAB, no wheezes/rales/rhonci  HEART: RRR  normal s1 and s2, no murmurs/rubs/gallops    ABDOMEN: no rebound tenderness or guarding noted  EXTREMITIES: Extremities equal and symmetric. Bilateral LE edema      Labs:  Ordered by PCP. I offered to check labs this evening but daughter prefers to wait couple of days before doing them    ASSESSMENT & PLAN:     # Anorexia, nausea, reduced urine output  Daughter reports that she has urinated only once since this morning  She was initially borderline tachycardic  Clinical examination consistent with volume depletion  Has BLE edema but no evidence of pulmonary edema  - given 1 L NS in one hour in the clinic. Tolerated the IVF with no issue  Noted improvement in tachycardia and BP came up  Discussed further care, trial of oral antiemetics and encourage increased oral intake  Advised consider doing the labs within next couple of days  Discussed red flag symptoms and appropriate course of action including ER vs return to the clinic  Daughter not keen to take her to ER but explained if critically ill, many need to go to ER    Total time spent 70 minutes    The above plan of care, diagnoses, orders, and follow-up were discussed with the patient. Questions related to this recommended plan of care were answered.    Marylou Flesher, M.D

## 2021-04-09 NOTE — Nursing Note
Discharged pt home via W/C,LOC was the same. Assisted the daughter picked up the patient at the curbside.

## 2021-04-09 NOTE — Nursing Note
Pt arrived with her mom via W/C, pt IV hydration tartrated at 1746 and ending at 1900.

## 2021-04-10 LAB — Blood Culture Detection
BLOOD CULTURE FINAL STATUS: NEGATIVE
BLOOD CULTURE FINAL STATUS: NEGATIVE

## 2021-04-11 NOTE — Telephone Encounter
Referral sent to [facility]

## 2021-04-13 ENCOUNTER — Ambulatory Visit: Payer: PRIVATE HEALTH INSURANCE

## 2021-04-13 NOTE — Telephone Encounter
Message to Practice/Provider      Message: [place] with [employer] calling to advise that Pt's instance coverage is not in network. Pt is aware and Pt's Daughter states that she will contact [employer] Direct[employer] to see who is in Network. Pt's daughter Will call back to provide information     Return call is not being requested by the patient or caller.    Patient or caller has been notified of the turnaround time of 1-2 business day(s).

## 2021-04-18 ENCOUNTER — Ambulatory Visit: Payer: PRIVATE HEALTH INSURANCE

## 2021-04-18 ENCOUNTER — Telehealth: Payer: PRIVATE HEALTH INSURANCE

## 2021-04-18 NOTE — Telephone Encounter
Hi Dr Domenic Polite,     Pts daughter is calling to f/u w/ request for port placement - they'd like to s/w you today if possible.

## 2021-04-18 NOTE — Telephone Encounter
Call Back Request      Reason for call back: tracy pts daughter is requesting a call back from MD ASAP. Per daughter pt unable to get hydration due to nurses not being able to access veins. Per daughter she is requesting PORT  Procedure ASAP.Marland Kitchendaughter would like to speak w MD to discuss    P: [phone] [name]     Any Symptoms:  '[]'$  Yes  '[x]'$  No       If yes, what symptoms are you experiencing:    o Duration of symptoms (how long):    o Have you taken medication for symptoms (OTC or Rx):      If call was taken outside of clinic hours:    '[]'$ Patient or caller has been notified that this message was sent outside of normal clinic hours.     '[]'$ Patient or caller has been warm transferred to the physician's answering service. If applicable, patient or caller informed to please call us back if symptoms progress.  Patient or caller has been notified of the turnaround time of 1-2 business day(s).

## 2021-04-18 NOTE — Telephone Encounter
PDL Call to Clinic    Reason for Call:Pts daughter [name] called in adamant about speaking to the office directly, she is following up on the message below and has not received a call back. Called pdl for assistance.     Appointment Related?  '[]'$  Yes  '[x]'$  No     If yes;  Date:NA  Time:NA     Call warm transferred to PDL: '[x]'$  Yes  '[]'$  No    Call Received by Clinic Representative:Danielle     If call not answered/not accepted, call received by Patient Services Representative:

## 2021-04-19 NOTE — Telephone Encounter
Pt has been schd for tomorrow for picc placement

## 2021-04-20 ENCOUNTER — Telehealth: Payer: Self-pay | Admitting: Internal Medicine

## 2021-04-20 ENCOUNTER — Telehealth: Payer: PRIVATE HEALTH INSURANCE

## 2021-04-20 ENCOUNTER — Ambulatory Visit: Payer: PRIVATE HEALTH INSURANCE

## 2021-04-20 DIAGNOSIS — Z515 Encounter for palliative care: Secondary | ICD-10-CM

## 2021-04-20 DIAGNOSIS — C779 Secondary and unspecified malignant neoplasm of lymph node, unspecified: Secondary | ICD-10-CM

## 2021-04-20 NOTE — Telephone Encounter
Good Morning,    PDL Call to Clinic    Reason for Call: patient's daughter called in advising that she needs an authorization from [place] so that the patient can get transportation to her appointment tomorrow    Appointment Related?  '[x]'$  Yes  '[]'$  No     If yes;  Date: 4/6  Time: 11:20 AM    Call warm transferred to PDL: '[x]'$  Yes  '[]'$  No    Call Received by Clinic Representative: Danielle    If call not answered/not accepted, call received by Patient Services Representative:

## 2021-04-20 NOTE — Progress Notes
Spoke with Alisha Orr (daughter). She is requesting hospice referral.      She notes that her mom was doing okay initially. She was doing [employer], high dose THC, for her symptoms. It seemed to improve.     Her daughter shares that since hospitalization in 04/05/2021, she has continued to decline.     She has been delirious since then. She sleeps at night. She has not been able to walk. PO intake has also decreased.

## 2021-04-20 NOTE — Telephone Encounter (Signed)
disregard

## 2021-04-21 ENCOUNTER — Ambulatory Visit: Payer: PRIVATE HEALTH INSURANCE

## 2021-04-24 ENCOUNTER — Ambulatory Visit: Payer: PRIVATE HEALTH INSURANCE

## 2021-04-25 ENCOUNTER — Ambulatory Visit: Payer: PRIVATE HEALTH INSURANCE

## 2021-04-28 ENCOUNTER — Ambulatory Visit: Payer: PRIVATE HEALTH INSURANCE

## 2021-05-06 NOTE — Progress Notes
PATIENT: Alisha Orr  MRN: [id number]  DOB: 1955/04/14  DATE OF SERVICE: 05/09/2021    REFERRING PRACTITIONER: No ref. provider found  PRIMARY CARE PROVIDER: Trena Platt., DO      Patient Consent to Telehealth Questionnaire   No flowsheet data found.  - I agree  to be treated via a video visit and acknowledge that I may be liable for any relevant copays or coinsurance depending on my insurance plan.  - I understand that this video visit is offered for my convenience and I am able to cancel and reschedule for an in-person appointment if I desire.  - I also acknowledge that sensitive medical information may be discussed during this video visit appointment and that it is my responsibility to locate myself in a location that ensures privacy to my own level of comfort.  - I also acknowledge that I should not be participating in a video visit in a way that could cause danger to myself or to those around me (such as driving or walking).  If my provider is concerned about my safety, I understand that they have the right to terminate the visit.     The patient's visit was conducted through a HIPAA compliant telemedicine platform using synchronous audio, video, and access to the patient's medical chart and any prior imaging or pathology. The encounter needed to be a telemedicine encounter given the current social distancing recommendations during the COVID-19 pandemic. The option of a telemedicine encounter was offered and the patient gave their consent and elected to initiate the telemedicine visit. Due to the nature of this encounter, standard vitals and physical examination may not have been conducted unless otherwise noted in their respective portions of this progress note.       Subjective:       History of Present Illness:  I am seeing Alisha Orr in follow up today. Alisha Orr is a 66 y.o. female with a history of stage IVB high grade uterine carcinoma. Alisha Orr was seen on 03/15/2021 via telemedicine.    Tumor History: ?  - 2017: Consultation with Dr. Andrey Farmer for AUB. FSH and LH were in menopausal range and her endometrial biopsy showed inactive endometrium with squamous metaplasia. Was on unopposed estrogen for 10 years. ?  - 12/2020: Vaginal bleeding.   - 02/07/2021: Still experiencing vaginal bleeding.  Pap smear revealed adenocarcinoma, favoring endometrial origin. HPV negative.   Pelvic US: Endometrium difficult to evaluate due to large fibroid (13 x 12 x 14 cm). Large fibroid visualized extending throughout entire uterus, possible intramural. There appears to be some fluid with the LUS along internal cervical os, wrapping along side the fibroid.  EMB: poorly differentiated high grade malignant neoplasm.   - 02/15/2021: On exam, enlarged 20 week size uterus.  - 03/09/2021: PET/CT: Markedly enlarged myomatous uterus with intensely FDG-avid expansile endometrial lesion corresponding to known malignancy. Intensely FDG avid confluent retroperitoneal and bilateral iliac chain lymphadenopathy as well as at least 3 FDG avid intrathoracic nodal metastases, and right humeral head metastasis. Bilateral multifocal pulmonary emboli. No evidence of right heart strain, but clot burden appears significant.  - 03/11/2021: [facility] - Recommend neoadjuvant chemotherapy (Carboplatin/Paclitaxel) given metastatic disease and PE and genetic testing given personal and family history of malignancy. Can consider image guided biopsy for additional tumor testing such as NGS.  - 04/10/2021: CT A/P enlargement of uterus extending above the umbilicus with central heterogenous hypo attenuated mass and surrounding hypodensity consistent with uterine malignancy. Thickened omentum anterior to the  uterus with small ascites and findings consistent with carcinomatosis.   ?  05/09/2021: Here for follow up.    Past Medical History:   Diagnosis Date   ? Acute deep vein thrombosis (DVT) of proximal vein of lower extremity (HCC/RAF) 03/11/2021   ? Endometrial cancer (HCC/RAF)    ? Glaucoma    ? 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 ENDOMETRIAL BIOPSY     ? EYE SURGERY      detached retina, then removal of scar tissue   ? WISDOM TOOTH EXTRACTION       Outpatient Medications Prior to Visit   Medication Sig   ? cefuroxime 500 mg tablet Take 1 tablet (500 mg total) by mouth. (Patient not taking: Reported on 03/31/2021.)   ? cholecalciferol 125 mcg (5000 units) tablet Take 1 tablet (125 mcg total) by mouth. (Patient not taking: Reported on 03/31/2021.)   ? ferrous sulfate 324 MG TBEC Take 65 mg by mouth.   ? furosemide 20 mg tablet Take 1 tablet (20 mg total) by mouth daily. (Patient not taking: Reported on 04/08/2021.)   ? latanoprost 0.005% ophthalmic solution    ? ondansetron ODT 4 mg disintegrating tablet Take 1-2 tablets (4-8 mg total) by mouth every eight (8) hours as needed for Nausea or Vomiting.   ? oxyCODONE 5 mg tablet Take 0.5 tablets (2.5 mg total) by mouth every six (6) hours as needed for Moderate Pain (Pain Scale 4-6) or Severe Pain (Pain Scale 7-10). Max Daily Amount: 10 mg   ? senna 8.6 mg tablet Take 1 tablet by mouth two (2) times daily as needed for Constipation. (Patient not taking: Reported on 04/08/2021.)   ? timolol 0.5% ophthalmic solution      No facility-administered medications prior to visit.     Allergies   Allergen Reactions   ? Codeine Other (See Comments)     REACTION: ''out of body experience''  REACTION: ''out of body experience''  REACTION: ''out of body experience''  REACTION: ''out of body experience''     ? Latex Hives and Rash     Family History   Problem Relation Age of Onset   ? Pancreatic cancer Cousin      Social History     Socioeconomic History   ? Marital status: Other   Tobacco Use   ? Smoking status: Former     Types: Cigarettes   ? Smokeless tobacco: Never   Substance and Sexual Activity   ? Alcohol use: Yes Alcohol/week: 1.2 oz     Types: 2 Glasses of Wine (5 oz) per week   ? Drug use: Yes     Types: Other-see comments     Comment: [employer]   Social History Narrative    From [place] to [place] to be close to daughter while getting treatment    Lives with daughter and 2 grandchildren    Another daughter is here to help    4 adult children        Review of Systems:  A 14-system review of systems was performed and is negative except as stated in the history of present illness.      Objective:      Physical Exam:  There were no vitals taken for this visit.  No vitals. Exam deferred. Telehealth video appointment done.  Lab Review:  ***: ***  Component CA-125   Latest Ref Rng & Units 0 - 35 U/mL   03/01/2021 10     Pathology:  02/07/2021: Pap smear with HPV co-testing  ?  ?  02/07/2021: EMB  FINAL DIAGNOSIS ?   ?  ENDOMETRIUM (BIOPSY) [[id number]; 02/07/2021]:  - Poorly differentiated high grade malignant neoplasm, see comment  ?  Comment: Sections show solid sheets of a high grade necrotic tumor with small, round tumor cells showing high N/C ratio, hyperchromasia, and high mitotic activity. ?Immunohistochemically tumor cells show negative CK AE1/3 and ER, positive PAX8, P16, and cyclin D1, null-type aberrant P53, and some neuroendocrine expression (CD56). ?This uterine tumor has a broad differential diagnosis which includes undifferentiated carcinoma, high grade sarcoma, or a sarcoma component of MMMT. ?  ?  Preliminary findings discussed with Dr. Melody Comas (03/08/2021, 5:45 pm).     Radiology:  04/10/2021: CT A/P  FINDINGS:   Scans through the lung bases demonstrate a bilateral pleural effusions, small on the left and mild-to-moderate on the right. Mild bilateral lower lobe atelectatic changes are present. Mild anterior pericardial thickening is consistent with a small   pericardial effusion. There is a small pericardial lymph node present measuring 9 mm in short axis.     There is diffuse hepatic steatosis present. No liver masses are identified. The spleen, adrenal glands and pancreas are normal. The gallbladder is distended and otherwise unremarkable. The gastric antrum is collapsed and cannot be evaluated with the   stomach otherwise appearing unremarkable. The right kidney is normal and nonobstructed. There is mild?left hydronephrosis and dilatation of the left ureter to the level of the pelvic brim where it is no longer dilated. No urinary tract calculi are   present. The aorta is normal in caliber. Enlarged retrocrural lymph nodes are present.     There are enlarged retroperitoneal lymph nodes present in the upper retroperitoneum. A mantle of lymph nodes is present in the mid retroperitoneum at the level of the renal hila left greater than right displacing the aorta and renal pedicle anteriorly   and engulfing the left renal artery. Additional adenopathy is present extending to the lower retroperitoneum and bilateral pelvic sidewalls. There is no evidence of bowel obstruction or diverticulitis. The appendix is not identified with certainty and no   ?secondary signs of appendicitis are present. The uterus is markedly enlarged and diffusely heterogenous. The uterus contains a central heterogenous mass measuring approximately 11.3 cm transversely x 13 cm in AP diameter and 12.6 cm in craniocaudad   diameter with adjacent circumferential low density. Low density extends into the cervix where some air bubbles are present. The urinary bladder is compressed and displaced anteriorly by the enlarged uterus. There is diffuse thickening of the omentum   anterior to the uterus suggesting carcinomatosis. There is a small amount of ascites present. There is a mildly enlarged right inguinal lymph node present measuring 14 mm in short axis. Subcutaneous edema is present. Several air bubbles are present in   the left anterior?abdominal wall which may be secondary to an injection site. No bony destructive lesions are present.    IMPRESSION:   1. Small left pleural effusion and mild to moderate right pleural effusion with bilateral lower lobe mild atelectatic changes   2. Small pericardial effusion and mildly enlarged left pericardial lymph node   3. Diffuse hepatic steatosis   4. Distended but otherwise unremarkable gallbladder   5. Mild left hydronephrosis with dilatation of the left  ureter to the level of the pelvic brim without evidence of urinary tract calculi   6. Retrocrural and retroperitoneal adenopathy involving the periaortic region at the level of the renal hilum, lower retroperitoneum and bilateral pelvic sidewalls consistent with metastatic disease   7. The gastric antrum is collapsed and cannot be evaluated   8. No evidence of bowel obstruction or diverticulitis with the appendix not visualized   9. Marked enlargement of the uterus extending above the umbilicus with central heterogenous hypoattenuated mass and surrounding hypodensity consistent with a uterine malignancy. Hypodensity extends into the distended cervix where several small air   bubbles are?present   10. Thickened omentum anterior to the uterus with small ascites and findings consistent with carcinomatosis   11. Mildly enlarged right inguinal lymph node and subcutaneous edema with small air bubbles present within the left anterior abdominal wall at the level of the umbilicus which may reflect an injection site which should be correlated clinically      03/18/2021: Mammogram  MAMMOGRAM FINDINGS:     There are scattered areas of fibroglandular densities (density B).     No suspicious masses, calcifications or other abnormalities are seen in either breast.     IMPRESSION:     There is no mammographic evidence of malignancy.     Routine screening mammogram in 1 year is recommended.     BI-RADS Category 1:  Negative    03/09/2021: PET/CT  ONCOLOGIC FINDINGS:  ?  History of biopsy-proven endometrial adenocarcinoma with:    ?  - Enlarged myomatous uterus with multiple fibroids, the largest a submucosal anterior body fibroid measuring 10 x 14.4 cm, with an expansile heterogeneous intensely FDG avid endometrial mass, corresponding to the known malignancy;      -Intensely FDG avid lymphadenopathy including multiple intrathoracic nodes (left supraclavicular, left upper paratracheal and paraesophageal) and confluent retroperitoneal and bilateral iliac chain lymph nodes  ?  -Intensely FDG avid focus in the right humeral head measuring 14 x 18 mm (6-71, SUV max 13.4).  ?  -Mild perihepatic ascites and presacral fluid stranding without definite peritoneal nodularity.  ?  ?  TARGET LESIONS:  ?  -Right external iliac lymph node: 24 x 36 mm (6-303, SUV max 12.9).  ?  -Left para-aortic lymph node conglomerate: 44 x 53 mm (6-230, SUV max 15.7).  ?  ?  ADDITIONAL FINDINGS:  ?  BASE OF HEAD AND NECK:    Mucosal retention cyst in the left maxillary sinus.  Left thyroid nodule.  ?  CHEST:  Lungs: Punctate subpleural pulmonary nodules in the right upper lobe (0-981) and the left lower lobe (8-246).  Pleura: Unremarkable.  Cardiovascular: Extensive filling defects in the main and all bilateral lobar pulmonary artery branches. No definite CT findings of right heart strain.  ?  ABDOMEN/PELVIS:   Liver: Unremarkable.  Gallbladder and bile ducts: Unremarkable.  Spleen, pancreas, adrenals: Unremarkable.  Kidneys and ureters: Unremarkable.  Bowel: Unremarkable. Normal appendix.  Bladder: Underdistended with circumferential wall thickening out of proportion to degree of distention.  Vessels: IVC compressed by large expansile uterus.  Peritoneum: No masses.  Abdominal wall: Unremarkable.  ?  MUSCULOSKELETAL:  See above.  ?  ?  IMPRESSION:   1.  Markedly enlarged myomatous uterus with intensely FDG-avid expansile endometrial lesion corresponding to known malignancy.  2.  Intensely FDG avid confluent retroperitoneal and bilateral iliac chain lymphadenopathy as well as at least 3 FDG avid intrathoracic nodal metastases, and right humeral head metastasis.  3.  Bilateral  multifocal pulmonary emboli. No evidence of right heart strain, but clot burden appears significant.  4.  The urinary bladder is thickened for the degree of underdistention. Correlate with urinalysis.?     Assessment:     Alisha Orr is a 66 y.o. female with a history of stage IVB high grade uterine carcinoma. On review of Alisha Orr's interval pertinent laboratory values and pertinent radiological imaging studies, Alisha Orr's presentation is consistent with ***.        Plan/ Recommendation:   1) ***      I have recommended to Alisha Orr that we ***. Alisha Orr and family members have had all questions answered satisfactorily and are in agreement with this recommended plan of care.    Total time in consultation with the patient was approximately *** minutes. Greater than 50% of this time was spent in counseling and/or coordinating care for this patient.        cc Trena Platt., DO         Author: Melody Comas, MD, PhD   Professor  Division of Gynecologic Oncology  Department of Obstetrics and Gynecology  05/09/2021 3:37 PM

## 2021-05-09 ENCOUNTER — Telehealth: Payer: PRIVATE HEALTH INSURANCE

## 2021-05-10 DIAGNOSIS — C541 Malignant neoplasm of endometrium: Secondary | ICD-10-CM

## 2021-05-11 NOTE — Progress Notes
Patient was not seen in this encounter.

## 2021-05-16 DEATH — deceased
